# Patient Record
Sex: Female | Born: 1935 | Race: White | Hispanic: No | Smoking: Former smoker
Health system: Southern US, Community
[De-identification: ages and names within clinical notes are randomized; demographics above are authoritative.]

## PROBLEM LIST (undated history)

## (undated) DIAGNOSIS — I5030 Unspecified diastolic (congestive) heart failure: Secondary | ICD-10-CM

## (undated) DIAGNOSIS — R319 Hematuria, unspecified: Secondary | ICD-10-CM

## (undated) DIAGNOSIS — H9190 Unspecified hearing loss, unspecified ear: Secondary | ICD-10-CM

## (undated) DIAGNOSIS — I10 Essential (primary) hypertension: Secondary | ICD-10-CM

## (undated) DIAGNOSIS — M549 Dorsalgia, unspecified: Secondary | ICD-10-CM

## (undated) DIAGNOSIS — R269 Unspecified abnormalities of gait and mobility: Secondary | ICD-10-CM

## (undated) DIAGNOSIS — I272 Pulmonary hypertension, unspecified: Secondary | ICD-10-CM

## (undated) DIAGNOSIS — M353 Polymyalgia rheumatica: Secondary | ICD-10-CM

## (undated) DIAGNOSIS — I480 Paroxysmal atrial fibrillation: Secondary | ICD-10-CM

## (undated) DIAGNOSIS — I639 Cerebral infarction, unspecified: Secondary | ICD-10-CM

## (undated) DIAGNOSIS — I350 Nonrheumatic aortic (valve) stenosis: Secondary | ICD-10-CM

## (undated) DIAGNOSIS — C4490 Unspecified malignant neoplasm of skin, unspecified: Secondary | ICD-10-CM

## (undated) DIAGNOSIS — M810 Age-related osteoporosis without current pathological fracture: Secondary | ICD-10-CM

## (undated) DIAGNOSIS — J309 Allergic rhinitis, unspecified: Secondary | ICD-10-CM

## (undated) DIAGNOSIS — R609 Edema, unspecified: Secondary | ICD-10-CM

## (undated) DIAGNOSIS — F419 Anxiety disorder, unspecified: Secondary | ICD-10-CM

## (undated) DIAGNOSIS — N3090 Cystitis, unspecified without hematuria: Secondary | ICD-10-CM

## (undated) DIAGNOSIS — E782 Mixed hyperlipidemia: Secondary | ICD-10-CM

## (undated) DIAGNOSIS — M79643 Pain in unspecified hand: Secondary | ICD-10-CM

## (undated) HISTORY — DX: Anxiety disorder, unspecified: F41.9

## (undated) HISTORY — PX: BREAST BIOPSY: SHX20

## (undated) HISTORY — DX: Cerebral infarction, unspecified: I63.9

## (undated) HISTORY — DX: Unspecified malignant neoplasm of skin, unspecified: C44.90

## (undated) HISTORY — DX: Allergic rhinitis, unspecified: J30.9

## (undated) HISTORY — DX: Polymyalgia rheumatica: M35.3

## (undated) HISTORY — DX: Mixed hyperlipidemia: E78.2

## (undated) HISTORY — DX: Unspecified abnormalities of gait and mobility: R26.9

## (undated) HISTORY — DX: Nonrheumatic aortic (valve) stenosis: I35.0

## (undated) HISTORY — DX: Edema, unspecified: R60.9

## (undated) HISTORY — DX: Pain in unspecified hand: M79.643

## (undated) HISTORY — DX: Unspecified hearing loss, unspecified ear: H91.90

## (undated) HISTORY — DX: Cystitis, unspecified without hematuria: N30.90

## (undated) HISTORY — DX: Unspecified diastolic (congestive) heart failure: I50.30

## (undated) HISTORY — DX: Hematuria, unspecified: R31.9

## (undated) HISTORY — DX: Age-related osteoporosis without current pathological fracture: M81.0

## (undated) HISTORY — DX: Dorsalgia, unspecified: M54.9

---

## 2003-02-25 HISTORY — PX: INGUINAL HERNIA REPAIR: SUR1180

## 2004-08-16 ENCOUNTER — Ambulatory Visit: Payer: Self-pay | Admitting: Family Medicine

## 2005-09-03 ENCOUNTER — Ambulatory Visit: Payer: Self-pay | Admitting: Family Medicine

## 2005-09-09 ENCOUNTER — Ambulatory Visit: Payer: Self-pay | Admitting: Unknown Physician Specialty

## 2006-09-07 ENCOUNTER — Ambulatory Visit: Payer: Self-pay | Admitting: Family Medicine

## 2006-12-15 ENCOUNTER — Ambulatory Visit: Payer: Self-pay | Admitting: Family Medicine

## 2007-09-09 ENCOUNTER — Ambulatory Visit: Payer: Self-pay | Admitting: Family Medicine

## 2008-09-11 ENCOUNTER — Ambulatory Visit: Payer: Self-pay | Admitting: Family Medicine

## 2009-09-13 ENCOUNTER — Ambulatory Visit: Payer: Self-pay | Admitting: Family Medicine

## 2010-09-17 ENCOUNTER — Ambulatory Visit: Payer: Self-pay | Admitting: Family Medicine

## 2011-05-05 ENCOUNTER — Ambulatory Visit: Payer: Self-pay | Admitting: Family Medicine

## 2011-10-07 ENCOUNTER — Ambulatory Visit: Payer: Self-pay | Admitting: Family Medicine

## 2012-03-30 ENCOUNTER — Ambulatory Visit: Payer: Self-pay | Admitting: Family Medicine

## 2012-10-07 ENCOUNTER — Ambulatory Visit: Payer: Self-pay | Admitting: Family Medicine

## 2013-06-23 ENCOUNTER — Observation Stay: Payer: Self-pay | Admitting: Internal Medicine

## 2013-06-23 LAB — CBC
HCT: 43 % (ref 35.0–47.0)
HGB: 14.2 g/dL (ref 12.0–16.0)
MCH: 31.6 pg (ref 26.0–34.0)
MCHC: 32.9 g/dL (ref 32.0–36.0)
MCV: 96 fL (ref 80–100)
Platelet: 262 10*3/uL (ref 150–440)
RBC: 4.49 10*6/uL (ref 3.80–5.20)
RDW: 12.7 % (ref 11.5–14.5)
WBC: 10.1 10*3/uL (ref 3.6–11.0)

## 2013-06-23 LAB — COMPREHENSIVE METABOLIC PANEL
Albumin: 4.2 g/dL (ref 3.4–5.0)
Alkaline Phosphatase: 76 U/L
Anion Gap: 6 — ABNORMAL LOW (ref 7–16)
BUN: 25 mg/dL — AB (ref 7–18)
Bilirubin,Total: 0.7 mg/dL (ref 0.2–1.0)
CHLORIDE: 95 mmol/L — AB (ref 98–107)
CREATININE: 0.95 mg/dL (ref 0.60–1.30)
Calcium, Total: 10.1 mg/dL (ref 8.5–10.1)
Co2: 33 mmol/L — ABNORMAL HIGH (ref 21–32)
EGFR (Non-African Amer.): 54 — ABNORMAL LOW
GLUCOSE: 118 mg/dL — AB (ref 65–99)
OSMOLALITY: 274 (ref 275–301)
Potassium: 3.3 mmol/L — ABNORMAL LOW (ref 3.5–5.1)
SGOT(AST): 30 U/L (ref 15–37)
SGPT (ALT): 24 U/L (ref 12–78)
Sodium: 134 mmol/L — ABNORMAL LOW (ref 136–145)
TOTAL PROTEIN: 8.3 g/dL — AB (ref 6.4–8.2)

## 2013-06-23 LAB — URINALYSIS, COMPLETE
BLOOD: NEGATIVE
Bacteria: NONE SEEN
Bilirubin,UR: NEGATIVE
GLUCOSE, UR: NEGATIVE mg/dL (ref 0–75)
Hyaline Cast: 8
Nitrite: NEGATIVE
PH: 5 (ref 4.5–8.0)
Protein: 30
RBC,UR: 7 /HPF (ref 0–5)
Specific Gravity: 1.021 (ref 1.003–1.030)
WBC UR: 8 /HPF (ref 0–5)

## 2013-06-23 LAB — HEMOGLOBIN: HGB: 12.1 g/dL (ref 12.0–16.0)

## 2013-06-23 LAB — TROPONIN I: Troponin-I: <0.02 ng/mL

## 2013-06-24 LAB — CBC WITH DIFFERENTIAL/PLATELET
Basophil #: 0.1 10*3/uL (ref 0.0–0.1)
Basophil %: 0.9 %
Eosinophil #: 0.1 10*3/uL (ref 0.0–0.7)
Eosinophil %: 1.3 %
HCT: 32.8 % — ABNORMAL LOW (ref 35.0–47.0)
HGB: 11.2 g/dL — ABNORMAL LOW (ref 12.0–16.0)
Lymphocyte #: 1.5 10*3/uL (ref 1.0–3.6)
Lymphocyte %: 21 %
MCH: 32.5 pg (ref 26.0–34.0)
MCHC: 34 g/dL (ref 32.0–36.0)
MCV: 96 fL (ref 80–100)
Monocyte #: 0.7 x10 3/mm (ref 0.2–0.9)
Monocyte %: 9.4 %
NEUTROS ABS: 4.7 10*3/uL (ref 1.4–6.5)
Neutrophil %: 67.4 %
PLATELETS: 215 10*3/uL (ref 150–440)
RBC: 3.44 10*6/uL — AB (ref 3.80–5.20)
RDW: 12.7 % (ref 11.5–14.5)
WBC: 6.9 10*3/uL (ref 3.6–11.0)

## 2013-06-24 LAB — BASIC METABOLIC PANEL
Anion Gap: 3 — ABNORMAL LOW (ref 7–16)
BUN: 17 mg/dL (ref 7–18)
Calcium, Total: 8.5 mg/dL (ref 8.5–10.1)
Chloride: 103 mmol/L (ref 98–107)
Co2: 31 mmol/L (ref 21–32)
Creatinine: 0.59 mg/dL — ABNORMAL LOW (ref 0.60–1.30)
EGFR (African American): >60
EGFR (Non-African Amer.): >60
Glucose: 97 mg/dL (ref 65–99)
OSMOLALITY: 275 (ref 275–301)
Potassium: 3.5 mmol/L (ref 3.5–5.1)
Sodium: 137 mmol/L (ref 136–145)

## 2013-06-24 LAB — HEMOGLOBIN
HGB: 11.2 g/dL — ABNORMAL LOW (ref 12.0–16.0)
HGB: 11.7 g/dL — ABNORMAL LOW (ref 12.0–16.0)

## 2013-06-24 LAB — TSH: Thyroid Stimulating Horm: 0.95 u[IU]/mL

## 2013-06-24 LAB — MAGNESIUM: MAGNESIUM: 1.5 mg/dL — AB

## 2013-06-25 LAB — URINE CULTURE

## 2013-08-19 ENCOUNTER — Ambulatory Visit: Payer: Self-pay | Admitting: Neurology

## 2013-11-16 ENCOUNTER — Ambulatory Visit: Payer: Self-pay | Admitting: Family Medicine

## 2014-06-17 NOTE — Discharge Summary (Signed)
PATIENT NAME:  Krystal White, Krystal White MR#:  [id number] DATE OF BIRTH:  10/15/1924  DATE OF ADMISSION:  06/23/2013 DATE OF DISCHARGE:   06/24/2013  ADMISSION DIAGNOSIS: 1.   Orthostatic hypotension.   DISCHARGE DIAGNOSES: 1.  Orthostatic hypotension.  2.  Chronic diarrhea.  3.  Dizziness.  4.  Hypertension.   CONSULTATIONS:  None.   PERTINENT LABORATORIES AT DISCHARGE:  White blood cells 6.9, hemoglobin 11.2, hematocrit 33. Platelets are 215. Sodium 137, potassium 3.5, chloride 103, bicarb 31, BUN 17, creatinine 0.59. Glucose is 97.   Magnesium 1.5, which was repleted.  TSH is 0.95.   Carotid ultrasound showed no hemodynamically significant stenosis.   HOSPITAL COURSE:  An 81 year old female who presented with dizziness and orthostatic hypotension. For further details, please refer to H and P.  1.  Orthostatic hypotension.  This is secondary to diuretics and/or her diarrhea with volume depletion. The patient is not orthostatic anymore with discontinuing her diuretics, as well as IV fluids. Physical therapy does recommend assisted living, but the patient will go home with 24-hour supervision.  2.  Dizziness from problem #1, which is resolved.  3.  Chronic diarrhea. The patient had no diarrhea while she was in the hospital. She has never had a colonoscopy. At her age, it may or may not be recommended, but I did ask the family to have her evaluated by GI.  4.  Hypertension. The patient will continue her metoprolol but will hold diuretics at this time.   DISCHARGE MEDICATIONS: 1.  Calcium 600 mg b.i.d.  2.  Centrum Silver 1 tablet daily.  3.  Aspirin 81 mg daily.  4.  Simvastatin 20 mg at bedtime. 5.  Metoprolol 25 mg daily.  6.  The patient will stop taking hydrochlorothiazide/triamterene.   Discharge home health with physical therapy, nurse, nurse aide and social work. Discharge home health assessment and gait training.   DISCHARGE DIET:  Low sodium.   DISCHARGE ACTIVITY:  As  tolerated.   DISCHARGE FOLLOWUP:  The patient is to follow up with Dr. Margarita Rana in 1 week.    TIME SPENT:  35 minutes.   ____________________________ Donell Beers. Benjie Karvonen, MD spm:dmm D: 06/24/2013 12:27:43 ET T: 06/24/2013 12:39:29 ET JOB#: [id number]  cc: Darrall Strey P. Benjie Karvonen, MD, <Dictator> Jerrell Belfast, MD Donell Beers Tyrik Stetzer MD ELECTRONICALLY SIGNED 06/24/2013 13:12

## 2014-06-17 NOTE — H&P (Signed)
PATIENT NAME:  Krystal White, Krystal White MR#:  [id number] DATE OF BIRTH:  14-Jun-1924  DATE OF ADMISSION:  06/23/2013  PRIMARY CARE PHYSICIAN: Dr. Margarita Rana.   REFERRING PHYSICIAN: Dr. Corky Downs.   CHIEF COMPLAINT: Dizziness.   HISTORY OF PRESENT ILLNESS: The patient is a pleasant 81 year old female who lives by herself. She has hypertension, hyperlipidemia. She is here for dizziness. She states that for the past couple of months she has been dizzy, unsteady on her feet where she just feels like her knees buckle due to weakness and she falls without loss of consciousness. She does not know when she will follow but does describe positional dizziness. She states in the morning when she is laying she is fine. With ambulation and mobility, she feels dizzy. Of note, also she describes more chronic in nature of diarrhea. She states she has to take multiple doses of Imodium and if she does not take Imodium has about 3 loose stools a day. Today, she felt dizzy again and also noted to have dark stools. She had no bright red blood per rectum. She came into the hospital where she was found to have significant orthostatic hypotension and had rectal exam without any bright red blood and had negative guaiac per ER physician. Hospitalist services were contacted for further evaluation and management.   PAST MEDICAL HISTORY: 1.  Hard of hearing with left-sided hearing loss. 2.  Hypercholesterolemia.  3.  Hypertension.   OUTPATIENT MEDICATIONS: Aspirin 81 mg daily, calcium plus D 600/600/200, one tab 2 times a day, Centrum multivitamin with minerals once a day, hydrochlorothiazide/triamterene 50/75 mg once a day, metoprolol succinate 25 mg extended-release daily, simvastatin 20 mg daily.   ALLERGIES: No known drug allergies.   FAMILY HISTORY: Dad with some heart issues and stomach cancer.   SOCIAL HISTORY: No tobacco, alcohol or drug use. Lives by herself.   REVIEW OF SYSTEMS: CONSTITUTIONAL: No fever or fatigue.  Has weakness and question of weight loss, but she does not know how much. She has good appetite.  EYES: No blurry vision or double vision.  ENT: Denies tinnitus or hearing loss. Has seasonal allergies, mostly at winter. RESPIRATORY: Has dry cough in winter, now resolved. No wheezing. Occasional shortness of breath, no dyspnea on exertion.  GASTROINTESTINAL: Denies nausea, vomiting, diarrhea as above. No abdominal pain. No bright red blood per rectum.  CARDIOVASCULAR: Denies chest pain or swelling in the legs. Denies palpitations.  GENITOURINARY: Denies dysuria, hematuria. HEMATOLOGIC AND LYMPHATIC: Denies anemia or easy bruising.  SKIN: No rashes.  MUSCULOSKELETAL: Has some chronic back pain requiring injections.  NEUROLOGIC: No focal weakness or numbness.  PSYCHIATRIC: Denies anxiety or insomnia.   PHYSICAL EXAMINATION: VITAL SIGNS: On arrival, temperature 98, pulse rate 88, respiratory rate 19, blood pressure 151/71, O2 sat 95% on room air. Orthostatic vitals: When lying, blood pressure 144/56 with pulse rate of 84 and standing pressures drop systolic 40 points to 510, diastolic 71 and pulse rate jumps to 110.  GENERAL: The patient is a well-developed, elderly female lying in bed, talking in full sentences but difficult of hearing. HEENT: Normocephalic, atraumatic. Pupils are equal and reactive. Anicteric sclerae. Dry mucous membranes.  NECK: Supple. No thyroid tenderness. No cervical lymphadenopathy.  CARDIOVASCULAR: S1, S2 regular. No significant murmurs appreciated.  LUNGS: Clear to auscultation. No wheezing, rhonchi or rales.  ABDOMEN: Soft, nontender, nondistended. Positive bowel sounds in all quadrants.  EXTREMITIES: No pitting edema.  NEUROLOGIC: Cranial nerves II through XII appear to be grossly intact. Strength is  5 out of 5 in all extremities. Sensation is intact to light touch. Good alternating hand movements bilaterally and good finger-to-nose touch.  SKIN: No obvious rashes or  lesions.  PSYCHIATRIC: Awake, alert, oriented x 3.  LABORATORIES AND IMAGING: Glucose 118, BUN 25, creatinine 0.95. Sodium 134, potassium 3.3. LFTs showed total protein of 8.3; otherwise, within normal limits. Troponin negative. White count of 10.1, hemoglobin 14.2. Platelets are 262.  UA with no nitrites, 2+ leukocyte esterase, 8 WBC, no bacteria.   EKG: Normal sinus rhythm, rate of 88. No acute ST changes.  X-ray of the chest, one view:  Clear lungs without airspace disease or edema. No pneumothorax. Heart size is normal.   ASSESSMENT AND PLAN: We have a pleasant 81 year old with hypertension and hyperlipidemia lives by herself, who comes in with chronic diarrhea with positional dizziness, an episode of weakness to the point of falling, was noted to have severe orthostatic hypotension. She also has negative guaiac here. I suspect her dizziness is likely secondary to severe orthostatic hypotension. The etiology  of this could be the chronic diarrhea she has been having causing volume loss and intravascular dehydration. Alternatively, the patient could also have a diuretic-induced orthostatic hypotension with hypokalemia. She is on rather high-dose diuretics at this time, which I would hold. Her systolic blood pressure drops 40 points with standing. With fluid, resuscitate her gently. We would also check twice daily orthostatic vitals, place her on a telemetry to evaluate for any significant arrhythmias, but she has an unremarkable EKG and negative troponin without any chest pains. In regards to the dark stools today, she denies having any bright red blood per rectum. Hemoglobin stable. The etiology of chronic diarrhea is unclear. Start with stool cultures including Clostridium difficile which I think is less likely. The chronic diarrhea, if we do not have an answer here, could be further investigated as an outpatient by gastroenterology referral and consult. I would order ultrasound of the carotids and  echocardiogram for completeness of workup, but at this point, she appears to be neurologically intact. I do not suspect a posterior cerebrovascular accident type of a picture. Would obtain physical therapy consult, ambulate the patient once hydration is done. Would check another guaiac, cycle the hemoglobin x 3. I would continue the beta blocker at this time. I would hold aspirin, start the patient on sequential compression devices and TEDs for deep vein thrombosis prophylaxis. I would continue the statin. In regards to the dark stool, I do not know if this is necessarily a gastrointestinal bleed but would check another guaiac stool, cycle the hemoglobin and start the patient on a proton pump inhibitor.  The patient is FULL CODE.  TOTAL TIME SPENT:  45 minutes.  ____________________________ Vivien Presto, MD sa:ce D: 06/23/2013 15:49:20 ET T: 06/23/2013 16:42:07 ET JOB#: [id number]  cc: Vivien Presto, MD, <Dictator> Jerrell Belfast, MD Karel Jarvis [facility] MD ELECTRONICALLY SIGNED 07/19/2013 15:52

## 2015-09-08 ENCOUNTER — Encounter: Payer: Self-pay | Admitting: Emergency Medicine

## 2015-09-08 ENCOUNTER — Emergency Department: Payer: Medicare Other

## 2015-09-08 ENCOUNTER — Emergency Department
Admission: EM | Admit: 2015-09-08 | Discharge: 2015-09-08 | Disposition: A | Discharge status: Home / Self Care | Payer: Medicare Other | Attending: Emergency Medicine | Admitting: Emergency Medicine

## 2015-09-08 DIAGNOSIS — Y929 Unspecified place or not applicable: Secondary | ICD-10-CM | POA: Insufficient documentation

## 2015-09-08 DIAGNOSIS — S62101A Fracture of unspecified carpal bone, right wrist, initial encounter for closed fracture: Secondary | ICD-10-CM

## 2015-09-08 DIAGNOSIS — W010XXA Fall on same level from slipping, tripping and stumbling without subsequent striking against object, initial encounter: Secondary | ICD-10-CM | POA: Diagnosis not present

## 2015-09-08 DIAGNOSIS — Y939 Activity, unspecified: Secondary | ICD-10-CM | POA: Diagnosis not present

## 2015-09-08 DIAGNOSIS — Y999 Unspecified external cause status: Secondary | ICD-10-CM | POA: Insufficient documentation

## 2015-09-08 DIAGNOSIS — I10 Essential (primary) hypertension: Secondary | ICD-10-CM | POA: Diagnosis not present

## 2015-09-08 DIAGNOSIS — S52501A Unspecified fracture of the lower end of right radius, initial encounter for closed fracture: Secondary | ICD-10-CM | POA: Insufficient documentation

## 2015-09-08 DIAGNOSIS — M25531 Pain in right wrist: Secondary | ICD-10-CM | POA: Diagnosis present

## 2015-09-08 HISTORY — DX: Essential (primary) hypertension: I10

## 2015-09-08 MED ORDER — TRAMADOL HCL 50 MG PO TABS: 50.0000 mg | ORAL_TABLET | Freq: Two times a day (BID) | ORAL | Status: DC | PRN

## 2015-09-08 MED ORDER — IBUPROFEN 400 MG PO TABS
400.0000 mg | ORAL_TABLET | Freq: Once | ORAL | Status: AC
  Administered 2015-09-08: 400 mg via ORAL
  Filled 2015-09-08: qty 1

## 2015-09-08 MED ORDER — TRAMADOL HCL 50 MG PO TABS
50.0000 mg | ORAL_TABLET | Freq: Once | ORAL | Status: AC
  Administered 2015-09-08: 50 mg via ORAL
  Filled 2015-09-08: qty 1

## 2015-09-08 NOTE — ED Notes (Signed)
Fell last pm  Having pain to right wrist  Unsure of what made her fall

## 2015-09-08 NOTE — ED Provider Notes (Signed)
[facility] Emergency Department Provider Note   ____________________________________________  Time seen: Approximately 1:33 PM  I have reviewed the triage vital signs and the nursing notes.   HISTORY  Chief Complaint Fall    HPI Krystal White is a 82 y.o. female patient complaining of right wrist pain and swelling secondary to a fall which happened last night. She denies any LOC. Patient denies any head injury. States rates the pain as a 10 over 10. No palliative measures taken for this complaint. Patient described a pain as "sharp". Patient states pain increase with flexion and extension of the wrist. Patient is right-hand dominant.   Past Medical History  Diagnosis Date  . Hypertension     There are no active problems to display for this patient.   History reviewed. No pertinent past surgical history.  Current Outpatient Rx  Name  Route  Sig  Dispense  Refill  . traMADol (ULTRAM) 50 MG tablet   Oral   Take 1 tablet (50 mg total) by mouth every 12 (twelve) hours as needed for moderate pain.   12 tablet   0     Allergies Review of patient's allergies indicates no known allergies.  No family history on file.  Social History Social History  Substance Use Topics  . Smoking status: Never Smoker   . Smokeless tobacco: None  . Alcohol Use: No    Review of Systems Constitutional: No fever/chills Eyes: No visual changes. ENT: No sore throat. Cardiovascular: Denies chest pain. Respiratory: Denies shortness of breath. Gastrointestinal: No abdominal pain.  No nausea, no vomiting.  No diarrhea.  No constipation. Genitourinary: Negative for dysuria. Musculoskeletal: Right wrist pain Skin: Negative for rash. Neurological: Negative for headaches, focal weakness or numbness.    ____________________________________________   PHYSICAL EXAM:  VITAL SIGNS: ED Triage Vitals  Enc Vitals Group     BP 09/08/15 1320 153/93 mmHg   Pulse Rate 09/08/15 1320 72     Resp 09/08/15 1320 20     Temp 09/08/15 1320 98.1 F (36.7 C)     Temp Source 09/08/15 1320 Oral     SpO2 09/08/15 1320 97 %     Weight 09/08/15 1320 120 lb (54.432 kg)     Height 09/08/15 1320 5' (1.524 m)     Head Cir --      Peak Flow --      Pain Score 09/08/15 1322 10     Pain Loc --      Pain Edu? --      Excl. in [place]? --     Constitutional: Alert and oriented. Well appearing and in no acute distress. Eyes: Conjunctivae are normal. PERRL. EOMI. Head: Atraumatic. Nose: No congestion/rhinnorhea. Mouth/Throat: Mucous membranes are moist.  Oropharynx non-erythematous. Neck: No stridor.  No cervical spine tenderness to palpation. Hematological/Lymphatic/Immunilogical: No cervical lymphadenopathy. Cardiovascular: Normal rate, regular rhythm. Grossly normal heart sounds.  Good peripheral circulation. Respiratory: Normal respiratory effort.  No retractions. Lungs CTAB. Gastrointestinal: Soft and nontender. No distention. No abdominal bruits. No CVA tenderness. Musculoskeletal: No obvious deformity to the right wrist. Patient has some moderate edema distal radius of the right wrist. Neurovascular intact.  Neurologic:  Normal speech and language. No gross focal neurologic deficits are appreciated. No gait instability. Skin:  Skin is warm, dry and intact. No rash noted. Psychiatric: Mood and affect are normal. Speech and behavior are normal.  ____________________________________________   LABS (all labs ordered are listed, but only abnormal results are displayed)  Labs Reviewed - No data to display ____________________________________________  EKG   ____________________________________________  RADIOLOGY  X-ray reveals distal radial fracture. I, Sable Feil, personally viewed and evaluated these images (plain radiographs) as part of my medical decision making, as well as reviewing the written report by the  radiologist.  ____________________________________________   PROCEDURES  Procedure(s) performed: None  Procedures  Critical Care performed: No  ____________________________________________   INITIAL IMPRESSION / ASSESSMENT AND PLAN / ED COURSE  Pertinent labs & imaging results that were available during my care of the patient were reviewed by me and considered in my medical decision making (see chart for details).  Distal radial fracture left wrist. Discussed x-ray finding with patient. Patient placed in a volar splint. Patient given a prescription for tramadol to take 1 every 12 hours as needed for pain. Patient to follow-up with orthopedics Department in 2 days for further evaluation and treatment.` ____________________________________________   FINAL CLINICAL IMPRESSION(S) / ED DIAGNOSES  Final diagnoses:  Right wrist fracture, closed, initial encounter      NEW MEDICATIONS STARTED DURING THIS VISIT:  New Prescriptions   TRAMADOL (ULTRAM) 50 MG TABLET    Take 1 tablet (50 mg total) by mouth every 12 (twelve) hours as needed for moderate pain.     Note:  This document was prepared using Dragon voice recognition software and may include unintentional dictation errors.    Sable Feil, PA-C 09/08/15 1403  Harvest Dark, MD 09/08/15 1451

## 2015-09-08 NOTE — Discharge Instructions (Signed)
Wrist Fracture °A wrist fracture is a break or crack in one of the bones of your wrist. Your wrist is made up of eight small bones at the palm of your hand (carpal bones) and two long bones that make up your forearm (radius and ulna). °CAUSES °· A direct blow to the wrist. °· Falling on an outstretched hand. °· Trauma, such as a car accident or a fall. °RISK FACTORS °Risk factors for wrist fracture include: °· Participating in contact and high-risk sports, such as skiing, biking, and ice skating. °· Taking steroid medicines. °· Smoking. °· Being female. °· Being Caucasian. °· Drinking more than three alcoholic beverages per day. °· Having low or lowered bone density (osteoporosis or osteopenia). °· Age. Older adults have decreased bone density. °· Women who have had menopause. °· History of previous fractures. °SIGNS AND SYMPTOMS °Symptoms of wrist fractures include tenderness, bruising, and inflammation. Additionally, the wrist may hang in an odd position or appear deformed. °DIAGNOSIS °Diagnosis may include: °· Physical exam. °· X-ray. °TREATMENT °Treatment depends on many factors, including the nature and location of the fracture, your age, and your activity level. Treatment for wrist fracture can be nonsurgical or surgical. °Nonsurgical Treatment °A plaster cast or splint may be applied to your wrist if the bone is in a good position. If the fracture is not in good position, it may be necessary for your health care provider to realign it before applying a splint or cast. Usually, a cast or splint will be worn for several weeks. °Surgical Treatment °Sometimes the position of the bone is so far out of place that surgery is required to apply a device to hold it together as it heals. Depending on the fracture, there are a number of options for holding the bone in place while it heals, such as a cast and metal pins. °HOME CARE INSTRUCTIONS °· Keep your injured wrist elevated and move your fingers as much as  possible. °· Do not put pressure on any part of your cast or splint. It may break. °· Use a plastic bag to protect your cast or splint from water while bathing or showering. Do not lower your cast or splint into water. °· Take medicines only as directed by your health care provider. °· Keep your cast or splint clean and dry. If it becomes wet, damaged, or suddenly feels too tight, contact your health care provider right away. °· Do not use any tobacco products including cigarettes, chewing tobacco, or electronic cigarettes. Tobacco can delay bone healing. If you need help quitting, ask your health care provider. °· Keep all follow-up visits as directed by your health care provider. This is important. °· Ask your health care provider if you should take supplements of calcium and vitamins C and D to promote bone healing. °SEEK MEDICAL CARE IF: °· Your cast or splint is damaged, breaks, or gets wet. °· You have a fever. °· You have chills. °· You have continued severe pain or more swelling than you did before the cast was put on. °SEEK IMMEDIATE MEDICAL CARE IF: °· Your hand or fingernails on the injured arm turn blue or gray, or feel cold or numb. °· You have decreased feeling in the fingers of your injured arm. °MAKE SURE YOU: °· Understand these instructions. °· Will watch your condition. °· Will get help right away if you are not doing well or get worse. °  °This information is not intended to replace advice given to you by your   health care provider. Make sure you discuss any questions you have with your health care provider.   Document Released: 11/20/2004 Document Revised: 11/01/2014 Document Reviewed: 02/28/2011 Elsevier Interactive Patient Education 2016 [employer] or Splint Care Casts and splints support injured limbs and keep bones from moving while they heal. It is important to care for your cast or splint at home.  HOME CARE INSTRUCTIONS  Keep the cast or splint uncovered during the  drying period. It can take 24 to 48 hours to dry if it is made of plaster. A fiberglass cast will dry in less than 1 hour.  Do not rest the cast on anything harder than a pillow for the first 24 hours.  Do not put weight on your injured limb or apply pressure to the cast until your health care provider gives you permission.  Keep the cast or splint dry. Wet casts or splints can lose their shape and may not support the limb as well. A wet cast that has lost its shape can also create harmful pressure on your skin when it dries. Also, wet skin can become infected.  Cover the cast or splint with a plastic bag when bathing or when out in the rain or snow. If the cast is on the trunk of the body, take sponge baths until the cast is removed.  If your cast does become wet, dry it with a towel or a blow dryer on the cool setting only.  Keep your cast or splint clean. Soiled casts may be wiped with a moistened cloth.  Do not place any hard or soft foreign objects under your cast or splint, such as cotton, toilet paper, lotion, or powder.  Do not try to scratch the skin under the cast with any object. The object could get stuck inside the cast. Also, scratching could lead to an infection. If itching is a problem, use a blow dryer on a cool setting to relieve discomfort.  Do not trim or cut your cast or remove padding from inside of it.  Exercise all joints next to the injury that are not immobilized by the cast or splint. For example, if you have a long leg cast, exercise the hip joint and toes. If you have an arm cast or splint, exercise the shoulder, elbow, thumb, and fingers.  Elevate your injured arm or leg on 1 or 2 pillows for the first 1 to 3 days to decrease swelling and pain.It is best if you can comfortably elevate your cast so it is higher than your heart. SEEK MEDICAL CARE IF:   Your cast or splint cracks.  Your cast or splint is too tight or too loose.  You have unbearable itching  inside the cast.  Your cast becomes wet or develops a soft spot or area.  You have a bad smell coming from inside your cast.  You get an object stuck under your cast.  Your skin around the cast becomes red or raw.  You have new pain or worsening pain after the cast has been applied. SEEK IMMEDIATE MEDICAL CARE IF:   You have fluid leaking through the cast.  You are unable to move your fingers or toes.  You have discolored (blue or white), cool, painful, or very swollen fingers or toes beyond the cast.  You have tingling or numbness around the injured area.  You have severe pain or pressure under the cast.  You have any difficulty with your breathing or have shortness of  breath.  You have chest pain.   This information is not intended to replace advice given to you by your health care provider. Make sure you discuss any questions you have with your health care provider.   Document Released: 02/08/2000 Document Revised: 12/01/2012 Document Reviewed: 08/19/2012 Elsevier Interactive Patient Education [employer].

## 2015-09-08 NOTE — ED Notes (Signed)
Rt wrist pain and swelling after falling - tripped, was not dizzy.

## 2017-02-02 ENCOUNTER — Inpatient Hospital Stay (HOSPITAL_COMMUNITY)
Admit: 2017-02-02 | Discharge: 2017-02-02 | Disposition: A | Discharge status: Home / Self Care | Payer: Medicare Other | Attending: Internal Medicine | Admitting: Internal Medicine

## 2017-02-02 ENCOUNTER — Other Ambulatory Visit: Payer: Self-pay

## 2017-02-02 ENCOUNTER — Encounter: Payer: Self-pay | Admitting: Emergency Medicine

## 2017-02-02 ENCOUNTER — Inpatient Hospital Stay
Admission: EM | Admit: 2017-02-02 | Discharge: 2017-02-07 | DRG: 291 | Disposition: A | Discharge status: Home Health | Payer: Medicare Other | Attending: Specialist | Admitting: Specialist

## 2017-02-02 ENCOUNTER — Emergency Department: Payer: Medicare Other

## 2017-02-02 DIAGNOSIS — I272 Pulmonary hypertension, unspecified: Secondary | ICD-10-CM | POA: Diagnosis present

## 2017-02-02 DIAGNOSIS — J810 Acute pulmonary edema: Secondary | ICD-10-CM | POA: Diagnosis present

## 2017-02-02 DIAGNOSIS — E876 Hypokalemia: Secondary | ICD-10-CM | POA: Diagnosis present

## 2017-02-02 DIAGNOSIS — Z974 Presence of external hearing-aid: Secondary | ICD-10-CM

## 2017-02-02 DIAGNOSIS — J9601 Acute respiratory failure with hypoxia: Secondary | ICD-10-CM | POA: Diagnosis present

## 2017-02-02 DIAGNOSIS — I5031 Acute diastolic (congestive) heart failure: Secondary | ICD-10-CM | POA: Diagnosis present

## 2017-02-02 DIAGNOSIS — I340 Nonrheumatic mitral (valve) insufficiency: Secondary | ICD-10-CM | POA: Diagnosis not present

## 2017-02-02 DIAGNOSIS — I071 Rheumatic tricuspid insufficiency: Secondary | ICD-10-CM | POA: Diagnosis present

## 2017-02-02 DIAGNOSIS — I509 Heart failure, unspecified: Secondary | ICD-10-CM

## 2017-02-02 DIAGNOSIS — I4891 Unspecified atrial fibrillation: Secondary | ICD-10-CM

## 2017-02-02 DIAGNOSIS — H9190 Unspecified hearing loss, unspecified ear: Secondary | ICD-10-CM | POA: Diagnosis present

## 2017-02-02 DIAGNOSIS — I110 Hypertensive heart disease with heart failure: Secondary | ICD-10-CM | POA: Diagnosis not present

## 2017-02-02 DIAGNOSIS — R791 Abnormal coagulation profile: Secondary | ICD-10-CM | POA: Diagnosis present

## 2017-02-02 DIAGNOSIS — I481 Persistent atrial fibrillation: Secondary | ICD-10-CM | POA: Diagnosis present

## 2017-02-02 DIAGNOSIS — E785 Hyperlipidemia, unspecified: Secondary | ICD-10-CM | POA: Diagnosis present

## 2017-02-02 DIAGNOSIS — I351 Nonrheumatic aortic (valve) insufficiency: Secondary | ICD-10-CM

## 2017-02-02 DIAGNOSIS — Z8249 Family history of ischemic heart disease and other diseases of the circulatory system: Secondary | ICD-10-CM | POA: Diagnosis not present

## 2017-02-02 LAB — COMPREHENSIVE METABOLIC PANEL
ALBUMIN: 2.9 g/dL — AB (ref 3.5–5.0)
ALT: 54 U/L (ref 14–54)
AST: 49 U/L — AB (ref 15–41)
Alkaline Phosphatase: 68 U/L (ref 38–126)
Anion gap: 10 (ref 5–15)
BILIRUBIN TOTAL: 1.1 mg/dL (ref 0.3–1.2)
BUN: 16 mg/dL (ref 6–20)
CALCIUM: 8.8 mg/dL — AB (ref 8.9–10.3)
CO2: 27 mmol/L (ref 22–32)
Chloride: 97 mmol/L — ABNORMAL LOW (ref 101–111)
Creatinine, Ser: 0.7 mg/dL (ref 0.44–1.00)
GFR calc Af Amer: >60 mL/min (ref >60)
GFR calc non Af Amer: >60 mL/min (ref >60)
GLUCOSE: 114 mg/dL — AB (ref 65–99)
Potassium: 3.6 mmol/L (ref 3.5–5.1)
Sodium: 134 mmol/L — ABNORMAL LOW (ref 135–145)
TOTAL PROTEIN: 6.7 g/dL (ref 6.5–8.1)

## 2017-02-02 LAB — URINALYSIS, COMPLETE (UACMP) WITH MICROSCOPIC
BACTERIA UA: NONE SEEN
BILIRUBIN URINE: NEGATIVE
Glucose, UA: NEGATIVE mg/dL
Hgb urine dipstick: NEGATIVE
KETONES UR: NEGATIVE mg/dL
LEUKOCYTES UA: NEGATIVE
Nitrite: NEGATIVE
PH: 7 (ref 5.0–8.0)
Protein, ur: NEGATIVE mg/dL
SPECIFIC GRAVITY, URINE: 1.009 (ref 1.005–1.030)

## 2017-02-02 LAB — MAGNESIUM: Magnesium: 1.7 mg/dL (ref 1.7–2.4)

## 2017-02-02 LAB — PROTIME-INR
INR: 1.2
PROTHROMBIN TIME: 15.1 s (ref 11.4–15.2)

## 2017-02-02 LAB — CBC WITH DIFFERENTIAL/PLATELET
Basophils Absolute: 0.1 10*3/uL (ref 0–0.1)
Basophils Relative: 1 %
EOS ABS: 0.2 10*3/uL (ref 0–0.7)
EOS PCT: 3 %
HCT: 37.3 % (ref 35.0–47.0)
Hemoglobin: 12.7 g/dL (ref 12.0–16.0)
LYMPHS ABS: 0.8 10*3/uL — AB (ref 1.0–3.6)
LYMPHS PCT: 9 %
MCH: 32 pg (ref 26.0–34.0)
MCHC: 34.1 g/dL (ref 32.0–36.0)
MCV: 93.8 fL (ref 80.0–100.0)
Monocytes Absolute: 0.7 10*3/uL (ref 0.2–0.9)
Monocytes Relative: 8 %
Neutro Abs: 7 10*3/uL — ABNORMAL HIGH (ref 1.4–6.5)
Neutrophils Relative %: 79 %
PLATELETS: 299 10*3/uL (ref 150–440)
RBC: 3.98 MIL/uL (ref 3.80–5.20)
RDW: 13.8 % (ref 11.5–14.5)
WBC: 8.8 10*3/uL (ref 3.6–11.0)

## 2017-02-02 LAB — APTT: aPTT: 88 seconds — ABNORMAL HIGH (ref 24–36)

## 2017-02-02 LAB — LIPASE, BLOOD: Lipase: 25 U/L (ref 11–51)

## 2017-02-02 LAB — ECHOCARDIOGRAM COMPLETE
HEIGHTINCHES: 61 in
Weight: 1872 oz

## 2017-02-02 LAB — PROCALCITONIN

## 2017-02-02 LAB — TROPONIN I
Troponin I: 0.03 ng/mL (ref <0.03)
Troponin I: 0.03 ng/mL (ref <0.03)
Troponin I: <0.03 ng/mL (ref <0.03)
Troponin I: 0.03 ng/mL (ref <0.03)

## 2017-02-02 LAB — LACTIC ACID, PLASMA: Lactic Acid, Venous: 1.8 mmol/L (ref 0.5–1.9)

## 2017-02-02 MED ORDER — METOPROLOL TARTRATE 5 MG/5ML IV SOLN
5.0000 mg | Freq: Four times a day (QID) | INTRAVENOUS | Status: DC | PRN
  Administered 2017-02-03 – 2017-02-04 (×3): 5 mg via INTRAVENOUS
  Filled 2017-02-02 (×3): qty 5

## 2017-02-02 MED ORDER — HEPARIN (PORCINE) IN NACL 100-0.45 UNIT/ML-% IJ SOLN
1000.0000 [IU]/h | INTRAMUSCULAR | Status: DC
  Administered 2017-02-02: 800 [IU]/h via INTRAVENOUS
  Administered 2017-02-03: 900 [IU]/h via INTRAVENOUS
  Administered 2017-02-04 – 2017-02-06 (×2): 1000 [IU]/h via INTRAVENOUS
  Filled 2017-02-02 (×4): qty 250

## 2017-02-02 MED ORDER — DOCUSATE SODIUM 100 MG PO CAPS: 100.0000 mg | ORAL_CAPSULE | Freq: Two times a day (BID) | ORAL | Status: DC | PRN

## 2017-02-02 MED ORDER — SIMVASTATIN 20 MG PO TABS
20.0000 mg | ORAL_TABLET | Freq: Every day | ORAL | Status: DC
  Administered 2017-02-02 – 2017-02-06 (×5): 20 mg via ORAL
  Filled 2017-02-02 (×5): qty 1

## 2017-02-02 MED ORDER — PIPERACILLIN-TAZOBACTAM 3.375 G IVPB
3.3750 g | Freq: Three times a day (TID) | INTRAVENOUS | Status: DC
  Administered 2017-02-02 – 2017-02-03 (×3): 3.375 g via INTRAVENOUS
  Filled 2017-02-02 (×3): qty 50

## 2017-02-02 MED ORDER — PIPERACILLIN-TAZOBACTAM 3.375 G IVPB 30 MIN
3.3750 g | Freq: Once | INTRAVENOUS | Status: AC
  Administered 2017-02-02: 3.375 g via INTRAVENOUS
  Filled 2017-02-02: qty 50

## 2017-02-02 MED ORDER — HEPARIN BOLUS VIA INFUSION
2700.0000 [IU] | Freq: Once | INTRAVENOUS | Status: AC
  Administered 2017-02-02: 2700 [IU] via INTRAVENOUS
  Filled 2017-02-02: qty 2700

## 2017-02-02 MED ORDER — FUROSEMIDE 10 MG/ML IJ SOLN
40.0000 mg | Freq: Once | INTRAMUSCULAR | Status: AC
  Administered 2017-02-02: 40 mg via INTRAVENOUS
  Filled 2017-02-02: qty 4

## 2017-02-02 MED ORDER — HEPARIN SODIUM (PORCINE) 5000 UNIT/ML IJ SOLN: 5000.0000 [IU] | Freq: Three times a day (TID) | INTRAMUSCULAR | Status: DC

## 2017-02-02 MED ORDER — METOPROLOL SUCCINATE ER 50 MG PO TB24
50.0000 mg | ORAL_TABLET | Freq: Every day | ORAL | Status: DC
  Administered 2017-02-03 – 2017-02-06 (×4): 50 mg via ORAL
  Filled 2017-02-02 (×4): qty 1

## 2017-02-02 MED ORDER — DILTIAZEM HCL 25 MG/5ML IV SOLN
10.0000 mg | Freq: Once | INTRAVENOUS | Status: AC
  Administered 2017-02-02: 10 mg via INTRAVENOUS
  Filled 2017-02-02: qty 5

## 2017-02-02 MED ORDER — FLUTICASONE PROPIONATE 50 MCG/ACT NA SUSP
2.0000 | Freq: Every day | NASAL | Status: DC | PRN
  Filled 2017-02-02: qty 16

## 2017-02-02 MED ORDER — VANCOMYCIN HCL IN DEXTROSE 750-5 MG/150ML-% IV SOLN
750.0000 mg | INTRAVENOUS | Status: DC
  Administered 2017-02-02: 750 mg via INTRAVENOUS
  Filled 2017-02-02: qty 150

## 2017-02-02 MED ORDER — FUROSEMIDE 10 MG/ML IJ SOLN
20.0000 mg | Freq: Two times a day (BID) | INTRAMUSCULAR | Status: DC
  Administered 2017-02-02 – 2017-02-03 (×2): 20 mg via INTRAVENOUS
  Filled 2017-02-02 (×2): qty 2

## 2017-02-02 MED ORDER — VANCOMYCIN HCL IN DEXTROSE 1-5 GM/200ML-% IV SOLN
1000.0000 mg | Freq: Once | INTRAVENOUS | Status: AC
  Administered 2017-02-02: 1000 mg via INTRAVENOUS
  Filled 2017-02-02: qty 200

## 2017-02-02 MED ORDER — METOPROLOL TARTRATE 5 MG/5ML IV SOLN
5.0000 mg | Freq: Once | INTRAVENOUS | Status: AC
  Administered 2017-02-02: 5 mg via INTRAVENOUS
  Filled 2017-02-02: qty 5

## 2017-02-02 MED ORDER — METOPROLOL SUCCINATE ER 25 MG PO TB24
25.0000 mg | ORAL_TABLET | Freq: Every day | ORAL | Status: DC
  Administered 2017-02-02: 25 mg via ORAL
  Filled 2017-02-02: qty 1

## 2017-02-02 NOTE — ED Notes (Signed)
Hearing aid found in bed linens while repositioning patient.  Hearing aid returned to patient and placed in specimen cup with a patient label on cup.  Cup placed in patient's purse by patient.

## 2017-02-02 NOTE — ED Notes (Signed)
Patient denies pain and is resting comfortably.  

## 2017-02-02 NOTE — Progress Notes (Signed)
CODE SEPSIS - PHARMACY COMMUNICATION  **Broad Spectrum Antibiotics should be administered within 1 hour of Sepsis diagnosis**  Time Code Sepsis Called/Page Received: 0801  Antibiotics Ordered: 0745  Time of 1st antibiotic administration: 8421  Additional action taken by pharmacy:   If necessary, Name of Provider/Nurse Contacted:     Napoleon Form ,PharmD Clinical Pharmacist  02/02/2017  8:01 AM

## 2017-02-02 NOTE — Consult Note (Signed)
Cardiology Consultation:   Patient ID: Krystal White; [id number]; 1924-05-29   Admit date: 02/02/2017 Date of Consult: 02/02/2017  Primary Care Provider: Patient, No Pcp Per Primary Cardiologist: new Fletcher Anon) Primary Electrophysiologist:  n/a   Patient Profile:   Krystal White is a 83 y.o. female with a hx of essential hypertension, hyperlipidemia and questionable history of atrial fibrillation who is being seen today for the evaluation of heart failure and A. fib with RVR at the request of Dr. Anselm Jungling.  History of Present Illness:   Krystal White is a 83 year old female with the above medical problems who presented with weakness and shortness of breath which started this morning.  No chest pain.  Atrial fibrillation is listed in her prior medical history although the patient denies any previous cardiac history.  She is overall a poor historian and hard of hearing.  She is independent and lives by herself.  She continues to drive. Her symptoms started this morning and she felt very dyspneic.  Upon arrival to the ER, she was noted to be in A. fib with RVR with heart rate ranging between 150-160 bpm.  She was mildly hypoxic and chest x-ray was consistent with heart failure.  She was given a diltiazem bolus and furosemide with subsequent improvement in her symptoms.  She feels better at the present time. She is a previous smoker.  Past Medical History:  Diagnosis Date  . Hypertension     History reviewed. No pertinent surgical history.   Home Medications:  Prior to Admission medications   Medication Sig Start Date End Date Taking? Authorizing Provider  fluticasone (FLONASE) 50 MCG/ACT nasal spray Place 2 sprays into the nose daily. 12/23/16 12/23/17 Yes [provider]  metoprolol succinate (TOPROL-XL) 25 MG 24 hr tablet Take 1 tablet by mouth daily. 10/27/13  Yes [provider]  simvastatin (ZOCOR) 20 MG tablet Take 1 tablet by mouth daily. 12/10/16  Yes  [provider]  traMADol (ULTRAM) 50 MG tablet Take 1 tablet (50 mg total) by mouth every 12 (twelve) hours as needed for moderate pain. Patient not taking: Reported on 02/02/2017 09/08/15   Sable Feil, PA-C    Inpatient Medications: Scheduled Meds: . furosemide  20 mg Intravenous Q12H  . [START ON 02/03/2017] metoprolol succinate  50 mg Oral Daily  . simvastatin  20 mg Oral q1800   Continuous Infusions: . piperacillin-tazobactam (ZOSYN)  IV    . vancomycin     PRN Meds: docusate sodium, fluticasone, metoprolol tartrate  Allergies:   No Known Allergies  Social History:   Social History   Socioeconomic History  . Marital status: Widowed    Spouse name: Not on file  . Number of children: Not on file  . Years of education: Not on file  . Highest education level: Not on file  Social Needs  . Financial resource strain: Not on file  . Food insecurity - worry: Not on file  . Food insecurity - inability: Not on file  . Transportation needs - medical: Not on file  . Transportation needs - non-medical: Not on file  Occupational History  . Not on file  Tobacco Use  . Smoking status: Never Smoker  . Smokeless tobacco: Never Used  Substance and Sexual Activity  . Alcohol use: No  . Drug use: Not on file  . Sexual activity: Not on file  Other Topics Concern  . Not on file  Social History Narrative  . Not on file  Family History:    Family History  Problem Relation Age of Onset  . Heart failure Sister      ROS:  Please see the history of present illness.  ROS  All other ROS reviewed and negative.     Physical Exam/Data:   Vitals:   02/02/17 1000 02/02/17 1030 02/02/17 1104 02/02/17 1127  BP: (!) 150/121 139/80 (!) 151/73 (!) 146/77  Pulse: (!) 138  90 (!) 110  Resp: 18 20 16 18   Temp:    98.3 F (36.8 C)  TempSrc:      SpO2: 96%  96% (!) 88%  Weight:      Height:        Intake/Output Summary (Last 24 hours) at 02/02/2017 1509 Last data  filed at 02/02/2017 1300 Gross per 24 hour  Intake 250 ml  Output 2100 ml  Net -1850 ml   Filed Weights   02/02/17 0740  Weight: 117 lb (53.1 kg)   Body mass index is 22.11 kg/m.  General:  Well nourished, frail elderly woman, in no acute distress HEENT: normal Lymph: no adenopathy Neck: Significant JVD Endocrine:  No thryomegaly Vascular: No carotid bruits; FA pulses 2+ bilaterally without bruits  Cardiac:  normal S1, S2; irregularly irregular and mildly tachycardic; 1 out of 6 systolic murmur at the base Lungs: Decreased breath sounds at the right base with bibasilar crackles Abd: soft, nontender, no hepatomegaly  Ext: no edema Musculoskeletal:  No deformities, BUE and BLE strength normal and equal Skin: warm and dry  Neuro:  CNs 2-12 intact, no focal abnormalities noted Psych:  Normal affect   EKG:  The EKG was personally reviewed and demonstrates: Atrial fibrillation with RVR and nonspecific ST changes related to tachycardia Telemetry:  Telemetry was personally reviewed and demonstrates: Atrial fibrillation with heart rate between 100-120 bpm  Relevant CV Studies: Echocardiogram done today was personally reviewed by me:  - Left ventricle: The cavity size was normal. Wall thickness was   normal. Systolic function was normal. The estimated ejection   fraction was in the range of 55% to 60%. Wall motion was normal;   there were no regional wall motion abnormalities. - Aortic valve: There was mild regurgitation. - Mitral valve: There was mild regurgitation. - Left atrium: The atrium was mildly dilated. - Right ventricle: The cavity size was moderately dilated. Wall   thickness was increased. Systolic function was mildly reduced. - Right atrium: The atrium was severely dilated. - Tricuspid valve: There was moderate regurgitation. - Pulmonary arteries: Systolic pressure was severely increased. PA   peak pressure: 65 mm Hg (S). - Inferior vena cava: The vessel was dilated.  The respirophasic   diameter changes were blunted (< 50%), consistent with elevated   central venous pressure. - Pericardium, extracardiac: A trivial pericardial effusion was   identified posterior to the heart.  Laboratory Data:  Chemistry Recent Labs  Lab 02/02/17 0739  NA 134*  K 3.6  CL 97*  CO2 27  GLUCOSE 114*  BUN 16  CREATININE 0.70  CALCIUM 8.8*  GFRNONAA >60  GFRAA >60  ANIONGAP 10    Recent Labs  Lab 02/02/17 0739  PROT 6.7  ALBUMIN 2.9*  AST 49*  ALT 54  ALKPHOS 68  BILITOT 1.1   Hematology Recent Labs  Lab 02/02/17 0739  WBC 8.8  RBC 3.98  HGB 12.7  HCT 37.3  MCV 93.8  MCH 32.0  MCHC 34.1  RDW 13.8  PLT 299   Cardiac Enzymes Recent  Labs  Lab 02/02/17 0739 02/02/17 1000 02/02/17 1332  TROPONINI 0.03* 0.03* <0.03   No results for input(s): TROPIPOC in the last 168 hours.  BNPNo results for input(s): BNP, PROBNP in the last 168 hours.  DDimer No results for input(s): DDIMER in the last 168 hours.  Radiology/Studies:  Dg Chest Port 1 View  Result Date: 02/02/2017 CLINICAL DATA:  Shortness of breath. EXAM: PORTABLE CHEST 1 VIEW COMPARISON:  06/23/2013 FINDINGS: The heart size and mediastinal contours are within normal limits. There is evidence of moderate congestive heart failure and bilateral pleural effusions, right greater than left. The visualized skeletal structures are unremarkable. IMPRESSION: Moderate CHF with bilateral pleural effusions, right greater than left. Electronically Signed   By: Aletta Edouard M.D.   On: 02/02/2017 08:08    Assessment and Plan:   1. Atrial fibrillation with rapid ventricular response: The exact onset is not entirely clear.  I agree with rate control with metoprolol which should be uptitrated as needed.  If heart rate starts going up, consider using diltiazem drip for short-term rate control.  I am going to start the patient on unfractionated heparin for now. CHADS VASc score is 5.  Thus, we should  strongly consider long-term anticoagulation likely with low-dose Eliquis.  I tried to discuss this with the patient but she did not seem to have a good understanding of the risks and benefits.  We should try to approach the family. 2. Acute diastolic heart failure likely in the setting of atrial fibrillation with rapid ventricular response.  Echocardiogram showed significant pulmonary hypertension and right-sided enlargement.  I suspect that this is likely due to left-sided heart failure although pulmonary embolism cannot be completely excluded.  I requested to start heparin for now.  The patient is volume overloaded and I agree with furosemide 20 mg intravenously twice daily.   For questions or updates, please contact [place] Please consult [email or web address] for contact info under Cardiology/STEMI.   Signed, Kathlyn Sacramento, MD  02/02/2017 3:09 PM

## 2017-02-02 NOTE — Progress Notes (Addendum)
ANTICOAGULATION CONSULT NOTE - Initial Consult  Pharmacy Consult for heparin Indication: atrial fibrillation  No Known Allergies  Patient Measurements: Height: 5\' 1"  (154.9 cm) Weight: 117 lb (53.1 kg) IBW/kg (Calculated) : 47.8 Heparin Dosing Weight: 53.1 kg  Vital Signs: Temp: 98.3 F (36.8 C) (12/10 1127) Temp Source: Oral (12/10 0739) BP: 146/77 (12/10 1127) Pulse Rate: 110 (12/10 1127)  Labs: Recent Labs    02/02/17 0739 02/02/17 1000 02/02/17 1332  HGB 12.7  --   --   HCT 37.3  --   --   PLT 299  --   --   CREATININE 0.70  --   --   TROPONINI 0.03* 0.03* <0.03    Estimated Creatinine Clearance: 35.3 mL/min (by C-G formula based on SCr of 0.7 mg/dL).   Medical History: Past Medical History:  Diagnosis Date  . Hypertension     Medications:  Infusions:  . heparin    . piperacillin-tazobactam (ZOSYN)  IV    . vancomycin      Assessment: 86 yof admitted with AF RVR. No PTA OAC noted. Pharmacy consulted to dose heparin for AF.   Goal of Therapy:  Heparin level 0.3-0.7 units/ml Monitor platelets by anticoagulation protocol: Yes   Plan:  Give 2700 units bolus x 1 Start heparin infusion at 800 units/hr Check anti-Xa level in 8 hours and daily while on heparin Continue to monitor H&H and platelets  02/02/17 1607 aPTT 88 and INR 1.2. Elevated aPTT likely related to heparin administration starting before baseline labs drawn.  Laural Benes, Pharm.D., BCPS Clinical Pharmacist 02/02/2017,3:13 PM    12/11 0000 heparin level 0.29. 800 unit bolus and increase rate to 900 units/hr. Recheck in 8 hours.  Sim Boast, PharmD, BCPS  02/03/17 2:51 AM

## 2017-02-02 NOTE — ED Provider Notes (Signed)
[facility] Emergency Department Provider Note  ____________________________________________  Time seen: Approximately 7:39 AM  I have reviewed the triage vital signs and the nursing notes.   HISTORY  Chief Complaint Shortness of Breath     HPI Krystal White is a 83 y.o. female with a history of hypertension and hyperlipidemia and atrial fibrillation brought to the ED via EMS from home who complains of shortness of breath and nonproductive cough for the past month.Symptoms are constant, worse with exertion. More short of breath lying down flat for this past month. Patient is more comfortable and breathes more easily when sitting upright which alleviates her symptoms somewhat. Denies pain. Never had anything like this before. Denies history of heart failure.  EMS report noting that she is in atrial fibrillation, rate ranging from 1:30 to 200. Patient is not on home oxygen. EMS report an oral temperature of 99.7.      Past Medical History:  Diagnosis Date  . Hypertension   Hyperlipidemia Atrial fibrillation Hearing loss   There are no active problems to display for this patient.    Past surgical history: Surgical History   Surgery Date Laterality Comments  HERNIA REPAIR 2005  Right inguinal hernia repair.  Breast biopsy         Prior to Admission medications   Medication Sig Start Date End Date Taking? Authorizing Provider  fluticasone (FLONASE) 50 MCG/ACT nasal spray Place 2 sprays into the nose daily. 12/23/16 12/23/17 Yes [provider]  metoprolol succinate (TOPROL-XL) 25 MG 24 hr tablet Take 1 tablet by mouth daily. 10/27/13  Yes [provider]  simvastatin (ZOCOR) 20 MG tablet Take 1 tablet by mouth daily. 12/10/16  Yes [provider]  traMADol (ULTRAM) 50 MG tablet Take 1 tablet (50 mg total) by mouth every 12 (twelve) hours as needed for moderate pain. Patient not taking: Reported on 02/02/2017  09/08/15   Sable Feil, PA-C  Medications   Medication Sig Dispensed Refills Start Date End Date Status  aspirin 81 MG EC tablet  Take 81 mg by mouth once daily.  0   Active  multivitamin tablet  Take 1 tablet by mouth once daily.  0   Active  calcium carbonate-vitamin D3 (CALTRATE 600+D) 600 mg(1,500mg ) -200 unit tablet  Take 1 tablet by mouth 2 (two) times daily with meals.  0   Active  cholecalciferol (VITAMIN D3) 2,000 unit capsule  Take 2,000 Units by mouth once daily.  0   Active  metoprolol succinate (TOPROL-XL) 25 MG XL tablet  TAKE ONE TABLET BY MOUTH EVERY DAY 90 tablet  1 12/10/2016  Active  simvastatin (ZOCOR) 20 MG tablet  TAKE ONE (1) TABLET EACH DAY 90 tablet  1 12/10/2016  Active  fluticasone (FLONASE) 50 mcg/actuation nasal spray  Place 2 sprays into both nostrils once daily 16 g  1         Allergies Patient has no known allergies.   No family history on file.  Social History Social History   Tobacco Use  . Smoking status: Never Smoker  . Smokeless tobacco: Never Used  Substance Use Topics  . Alcohol use: No  . Drug use: Not on file  No drug use  Review of Systems  Constitutional:   No fever or chills.  ENT:   No sore throat. No rhinorrhea. Cardiovascular:   No chest pain or syncope. Respiratory:   Positive shortness of breath and cough as above. Gastrointestinal:   Negative for abdominal  pain, vomiting and diarrhea.  Musculoskeletal:   No pain, positive peripheral edema for the past month. All other systems reviewed and are negative except as documented above in ROS and HPI.  ____________________________________________   PHYSICAL EXAM:  VITAL SIGNS: ED Triage Vitals  Enc Vitals Group     BP      Pulse      Resp      Temp      Temp src      SpO2      Weight      Height      Head Circumference      Peak Flow      Pain Score      Pain Loc      Pain Edu?      Excl. in [place]?   Oxygen saturation 79% on room air on  my exam  Vital signs reviewed, nursing assessments reviewed.   Constitutional:   Alert and oriented. Ill appearing but not in distress. Eyes:   No scleral icterus.  EOMI. No nystagmus. No conjunctival pallor. PERRL. ENT   Head:   Normocephalic and atraumatic.   Nose:   No congestion/rhinnorhea.    Mouth/Throat:   Dry mucous membranes, no pharyngeal erythema. No peritonsillar mass.    Neck:   No meningismus. Full ROM. Trachea midline Hematological/Lymphatic/Immunilogical:   No cervical lymphadenopathy. Cardiovascular:   Irregularly irregular rhythm, rate of about 160. Symmetric bilateral radial and DP pulses.  No murmurs.  Respiratory:   Tachypnea, no accessory muscle use. Diffuse crackles bilateral lower lungs. Symmetric air entry in all lung fields.. Gastrointestinal:   Soft and nontender. Non distended. There is no CVA tenderness.  No rebound, rigidity, or guarding. Genitourinary:   deferred Musculoskeletal:   Normal range of motion in all extremities. No joint effusions.  No lower extremity tenderness. 1+ pitting edema bilateral lower extremities Neurologic:   Normal speech and language.  Motor grossly intact. No gross focal neurologic deficits are appreciated.  Skin:    Skin is warm, dry and intact. No rash noted.  No petechiae, purpura, or bullae.  ____________________________________________    LABS (pertinent positives/negatives) (all labs ordered are listed, but only abnormal results are displayed) Labs Reviewed  COMPREHENSIVE METABOLIC PANEL - Abnormal; Notable for the following components:      Result Value   Sodium 134 (*)    Chloride 97 (*)    Glucose, Bld 114 (*)    Calcium 8.8 (*)    Albumin 2.9 (*)    AST 49 (*)    All other components within normal limits  TROPONIN I - Abnormal; Notable for the following components:   Troponin I 0.03 (*)    All other components within normal limits  CBC WITH DIFFERENTIAL/PLATELET - Abnormal; Notable for the  following components:   Neutro Abs 7.0 (*)    Lymphs Abs 0.8 (*)    All other components within normal limits  URINALYSIS, COMPLETE (UACMP) WITH MICROSCOPIC - Abnormal; Notable for the following components:   Color, Urine YELLOW (*)    APPearance CLEAR (*)    Squamous Epithelial / LPF 0-5 (*)    All other components within normal limits  CULTURE, BLOOD (ROUTINE X 2)  CULTURE, BLOOD (ROUTINE X 2)  URINE CULTURE  LACTIC ACID, PLASMA  LIPASE, BLOOD  MAGNESIUM  PROCALCITONIN   ____________________________________________   EKG  Interpreted by me Atrial fibrillation rate of 155. Normal axis and intervals. Normal QRS ST segments and T waves.  Prehospital  EKG obtained by EMS reviewed and interpreted by me, similar findings. Atrial fibrillation, rate of 169, normal axis intervals and no acute ischemic changes.  ____________________________________________    RADIOLOGY  Dg Chest Port 1 View  Result Date: 02/02/2017 CLINICAL DATA:  Shortness of breath. EXAM: PORTABLE CHEST 1 VIEW COMPARISON:  06/23/2013 FINDINGS: The heart size and mediastinal contours are within normal limits. There is evidence of moderate congestive heart failure and bilateral pleural effusions, right greater than left. The visualized skeletal structures are unremarkable. IMPRESSION: Moderate CHF with bilateral pleural effusions, right greater than left. Electronically Signed   By: Aletta Edouard M.D.   On: 02/02/2017 08:08    ____________________________________________   PROCEDURES Procedures CRITICAL CARE Performed by: Joni Fears, Dalin Caldera   Total critical care time: 35 minutes  Critical care time was exclusive of separately billable procedures and treating other patients.  Critical care was necessary to treat or prevent imminent or life-threatening deterioration.  Critical care was time spent personally by me on the following activities: development of treatment plan with patient and/or surrogate as  well as nursing, discussions with consultants, evaluation of patient's response to treatment, examination of patient, obtaining history from patient or surrogate, ordering and performing treatments and interventions, ordering and review of laboratory studies, ordering and review of radiographic studies, pulse oximetry and re-evaluation of patient's condition.  ____________________________________________   DIFFERENTIAL DIAGNOSIS  Multifocal pneumonia with sepsis, congestive heart failure, atrial fibrillation with rapid ventricular response  CLINICAL IMPRESSION / ASSESSMENT AND PLAN / ED COURSE  Pertinent labs & imaging results that were available during my care of the patient were reviewed by me and considered in my medical decision making (see chart for details).   Patient is not in severe distress, but not well appearing. Presents with orthopnea, cough, bilateral basilar crackles, peripheral edema. If this is early sepsis she would benefit from IV fluid boluses, and her tachycardia would be compensatory and therefore he would be detrimental to give her rate control agents such as diltiazem. However, this is heart failure due to atrial fibrillation or non-STEMI, rate control would be beneficial and she would need diuretics and sedative fluids. I'll therefore hold off on pharmacological management until doing chest x-ray to help guide therapy and hone differential until labs can be available.  Code sepsis initiated immediately upon assessment due to fever, hypoxic respiratory failure, tachycardia, suspected pneumonia, anticipated admission.  Clinical Course as of Feb 03 907  Mon Feb 02, 2017  2694 Cxr viewed by me when taken. C/w pulm. Edema. Will give lasix, diltiazem. Bp 180/90  [PS]  0830 Radiology report reviewed, agree with finding of chf / pulm edema / pl. effusions  [PS]    Clinical Course User Index [PS] Carrie Mew, MD     ----------------------------------------- 9:08  AM on 02/02/2017 -----------------------------------------  Rate controlled with diltiazem bolus. Blood pressure stable. Still requiring supplemental oxygen. Labs reassuring. Case discussed with the hospitalists for further management of new onset heart failure with pulmonary edema and hypoxic respiratory failure. Troponin very slightly elevated at 0.03, not indicative of non-STEMI at this time or unstable angina, would not anticoagulate at this time..  ____________________________________________   FINAL CLINICAL IMPRESSION(S) / ED DIAGNOSES    Final diagnoses:  Atrial fibrillation with rapid ventricular response ([place])  New onset of congestive heart failure ([facility])  Acute pulmonary edema (H[facility])  Acute respiratory failure with hypoxia (H[facility])      This SmartLink is deprecated. Use AVSMEDLIST instead to display the medication list for a  patient.   Portions of this note were generated with dragon dictation software. Dictation errors may occur despite best attempts at proofreading.    Carrie Mew, MD 02/02/17 [phone]

## 2017-02-02 NOTE — Progress Notes (Signed)
Need admission for A fib RVR and new onset CHF Pt is DNR.

## 2017-02-02 NOTE — ED Notes (Signed)
AAOx3.  Patient looking for hearing aid.  Looked in Red plaid flannel shirt, not found.  Looked in bed and linens, no found.  EMS called to look for hearing aid in ambulance.  Patient states she had been wearing a house coat at home and it may be in the house coat.

## 2017-02-02 NOTE — ED Triage Notes (Signed)
Arrives from home via [facility].  Patient called EMS this morning for c/o 1 month history of orthopnea and cough.  On EMS arrival, patient found to be in Afib with RVR rate 140-200.  Room Air O2Sats 80's.  Bilateral LE edema and crackles auscultated bibasilar.

## 2017-02-02 NOTE — Progress Notes (Signed)
Pharmacy Antibiotic Note  Krystal White is a 83 y.o. female admitted on 02/02/2017 with sepsis.  Pharmacy has been consulted for vancomycin and piperacillin/tazobactam dosing.  Piperacillin/tazobactam 3.375 g and vancomycin 1000 mg were received in ED.  Plan: Piperacillin/tazobactam 3.375 g IV q8h EI  Will order maintenance dose of vancomycin  750 mg IV q12h (12 hour stacked dose) to start this evening Goal VT 15-20 mcg/mL  Kinetics: Using actual body weight = 53 kg, CrCl = 40 mL/min Ke: 0.040 Half-life: 17 hrs Vd: 37 L Cmin (estimated): ~16 mcg/mL  Height: 5\' 1"  (154.9 cm) Weight: 117 lb (53.1 kg) IBW/kg (Calculated) : 47.8  Temp (24hrs), Avg:98.5 F (36.9 C), Min:98.5 F (36.9 C), Max:98.5 F (36.9 C)  Recent Labs  Lab 02/02/17 0739  WBC 8.8  CREATININE 0.70  LATICACIDVEN 1.8    Estimated Creatinine Clearance: 35.3 mL/min (by C-G formula based on SCr of 0.7 mg/dL).    No Known Allergies  Antimicrobials this admission: Piperacillin/tazobactam 12/10 Vancomycin 12/10 >>   Dose adjustments this admission:  Microbiology results: 12/10 BCx: Send 12/10 UCx: Sent  Thank you for allowing pharmacy to be a part of this patient's care.  Lenis Noon, PharmD, BCPS Clinical Pharmacist 02/02/2017 10:57 AM

## 2017-02-02 NOTE — Progress Notes (Signed)
*  PRELIMINARY RESULTS* Echocardiogram 2D Echocardiogram has been performed.  Krystal White 02/02/2017, 2:22 PM

## 2017-02-02 NOTE — H&P (Signed)
[facility] [place] NAME: Krystal White    MR#:  [id number]  DATE OF BIRTH:  1924-05-26  DATE OF ADMISSION:  02/02/2017  PRIMARY CARE PHYSICIAN: Patient, No Pcp Per   REQUESTING/REFERRING PHYSICIAN: stafford  CHIEF COMPLAINT:   Chief Complaint  Patient presents with  . Shortness of Breath    HISTORY OF PRESENT ILLNESS:  Krystal White is a 83 year old female with the above medical problems who presented with weakness and shortness of breath which started this morning.  No chest pain.  Atrial fibrillation is listed in her prior medical history although the patient denies any previous cardiac history.  She is overall a poor historian and hard of hearing.  She is independent and lives by herself.  She continues to drive. Her symptoms started this morning and she felt very dyspneic.  Upon arrival to the ER, she was noted to be in A. fib with RVR with heart rate ranging between 150-160 bpm.  She was mildly hypoxic and chest x-ray was consistent with heart failure.  She was given a diltiazem bolus and furosemide with subsequent improvement in her symptoms.  She feels better at the present time. She is a previous smoker.  PAST MEDICAL HISTORY:   Past Medical History:  Diagnosis Date  . Hypertension     PAST SURGICAL HISTORY: History reviewed. No pertinent surgical history.  SOCIAL HISTORY:  Social History   Tobacco Use  . Smoking status: Never Smoker  . Smokeless tobacco: Never Used  Substance Use Topics  . Alcohol use: No    FAMILY HISTORY:  Family History  Problem Relation Age of Onset  . Heart failure Sister     DRUG ALLERGIES: No Known Allergies  REVIEW OF SYSTEMS:   CONSTITUTIONAL: No fever, fatigue or weakness.  EYES: No blurred or double vision.  EARS, NOSE, AND THROAT: No tinnitus or ear pain.  RESPIRATORY: No cough,have shortness of breath, no wheezing or hemoptysis.  CARDIOVASCULAR: No chest pain, orthopnea, edema.   GASTROINTESTINAL: No nausea, vomiting, diarrhea or abdominal pain.  GENITOURINARY: No dysuria, hematuria.  ENDOCRINE: No polyuria, nocturia,  HEMATOLOGY: No anemia, easy bruising or bleeding SKIN: No rash or lesion. MUSCULOSKELETAL: No joint pain or arthritis.   NEUROLOGIC: No tingling, numbness, weakness.  PSYCHIATRY: No anxiety or depression.   MEDICATIONS AT HOME:  Prior to Admission medications   Medication Sig Start Date End Date Taking? Authorizing Provider  fluticasone (FLONASE) 50 MCG/ACT nasal spray Place 2 sprays into the nose daily. 12/23/16 12/23/17 Yes [provider]  metoprolol succinate (TOPROL-XL) 25 MG 24 hr tablet Take 1 tablet by mouth daily. 10/27/13  Yes [provider]  simvastatin (ZOCOR) 20 MG tablet Take 1 tablet by mouth daily. 12/10/16  Yes [provider]  traMADol (ULTRAM) 50 MG tablet Take 1 tablet (50 mg total) by mouth every 12 (twelve) hours as needed for moderate pain. Patient not taking: Reported on 02/02/2017 09/08/15   Sable Feil, PA-C      PHYSICAL EXAMINATION:   VITAL SIGNS: Blood pressure 139/68, pulse (!) 113, temperature 98.3 F (36.8 C), resp. rate 18, height 5\' 1"  (1.549 m), weight 53.1 kg (117 lb), SpO2 95 %.  GENERAL:  83 y.o.-year-old patient lying in the bed with no acute distress. Hard o hearing. EYES: Pupils equal, round, reactive to light and accommodation. No scleral icterus. Extraocular muscles intact.  HEENT: Head atraumatic, normocephalic. Oropharynx and nasopharynx clear.  NECK:  Supple, no jugular venous  distention. No thyroid enlargement, no tenderness.  LUNGS: Normal breath sounds bilaterally, no wheezing, b/l crepitation. No use of accessory muscles of respiration.  CARDIOVASCULAR: S1, S2 fast. No murmurs, rubs, or gallops.  ABDOMEN: Soft, nontender, nondistended. Bowel sounds present. No organomegaly or mass.  EXTREMITIES: b/l pedal edema,no cyanosis, or clubbing.  NEUROLOGIC: Cranial nerves  II through XII are intact. Muscle strength 5/5 in all extremities. Sensation intact. Gait not checked.  PSYCHIATRIC: The patient is alert and oriented x 3.  SKIN: No obvious rash, lesion, or ulcer.   LABORATORY PANEL:   CBC Recent Labs  Lab 02/02/17 0739  WBC 8.8  HGB 12.7  HCT 37.3  PLT 299  MCV 93.8  MCH 32.0  MCHC 34.1  RDW 13.8  LYMPHSABS 0.8*  MONOABS 0.7  EOSABS 0.2  BASOSABS 0.1   ------------------------------------------------------------------------------------------------------------------  Chemistries  Recent Labs  Lab 02/02/17 0739  NA 134*  K 3.6  CL 97*  CO2 27  GLUCOSE 114*  BUN 16  CREATININE 0.70  CALCIUM 8.8*  MG 1.7  AST 49*  ALT 54  ALKPHOS 68  BILITOT 1.1   ------------------------------------------------------------------------------------------------------------------ estimated creatinine clearance is 35.3 mL/min (by C-G formula based on SCr of 0.7 mg/dL). ------------------------------------------------------------------------------------------------------------------ No results for input(s): TSH, T4TOTAL, T3FREE, THYROIDAB in the last 72 hours.  Invalid input(s): FREET3   Coagulation profile Recent Labs  Lab 02/02/17 1607  INR 1.20   ------------------------------------------------------------------------------------------------------------------- No results for input(s): DDIMER in the last 72 hours. -------------------------------------------------------------------------------------------------------------------  Cardiac Enzymes Recent Labs  Lab 02/02/17 0739 02/02/17 1000 02/02/17 1332  TROPONINI 0.03* 0.03* <0.03   ------------------------------------------------------------------------------------------------------------------ Invalid input(s): POCBNP  ---------------------------------------------------------------------------------------------------------------  Urinalysis    Component Value Date/Time    COLORURINE YELLOW (A) 02/02/2017 0739   APPEARANCEUR CLEAR (A) 02/02/2017 0739   APPEARANCEUR Hazy 06/23/2013 1233   LABSPEC 1.009 02/02/2017 0739   LABSPEC 1.021 06/23/2013 1233   PHURINE 7.0 02/02/2017 0739   GLUCOSEU NEGATIVE 02/02/2017 0739   GLUCOSEU Negative 06/23/2013 1233   HGBUR NEGATIVE 02/02/2017 0739   BILIRUBINUR NEGATIVE 02/02/2017 0739   BILIRUBINUR Negative 06/23/2013 1233   KETONESUR NEGATIVE 02/02/2017 0739   PROTEINUR NEGATIVE 02/02/2017 0739   NITRITE NEGATIVE 02/02/2017 0739   LEUKOCYTESUR NEGATIVE 02/02/2017 0739   LEUKOCYTESUR 2+ 06/23/2013 1233     RADIOLOGY: Dg Chest Port 1 View  Result Date: 02/02/2017 CLINICAL DATA:  Shortness of breath. EXAM: PORTABLE CHEST 1 VIEW COMPARISON:  06/23/2013 FINDINGS: The heart size and mediastinal contours are within normal limits. There is evidence of moderate congestive heart failure and bilateral pleural effusions, right greater than left. The visualized skeletal structures are unremarkable. IMPRESSION: Moderate CHF with bilateral pleural effusions, right greater than left. Electronically Signed   By: Aletta Edouard M.D.   On: 02/02/2017 08:08    EKG: Orders placed or performed during the hospital encounter of 02/02/17  . EKG 12-Lead  . EKG 12-Lead    IMPRESSION AND PLAN:  * Ac diastolic CHF   IV lasix, I/O monitor,    Echo.   Cardio consult  * A fib with RVR   IV cardizem given, cont IV metoprolol as needed    Check Trop, monitor tele.   Echo and cardio consult.  *  Hypertension   Cont metoprolol.   All the records are reviewed and case discussed with ED provider. Management plans discussed with the patient, family and they are in agreement.  CODE STATUS: DNR    Code Status Orders  (From admission, onward)  Start     Ordered   02/02/17 1146  Do not attempt resuscitation (DNR)  Continuous    Question Answer Comment  In the event of cardiac or respiratory ARREST Do not call a "code blue"    In the event of cardiac or respiratory ARREST Do not perform Intubation, CPR, defibrillation or ACLS   In the event of cardiac or respiratory ARREST Use medication by any route, position, wound care, and other measures to relive pain and suffering. May use oxygen, suction and manual treatment of airway obstruction as needed for comfort.      02/02/17 1146    Code Status History    Date Active Date Inactive Code Status Order ID Comments User Context   This patient has a current code status but no historical code status.    Advance Directive Documentation     Most Recent Value  Type of Advance Directive  Living will  Pre-existing out of facility DNR order (yellow form or pink MOST form)  No data  "MOST" Form in Place?  No data       TOTAL TIME TAKING CARE OF THIS PATIENT: 50 minutes.    [name] M.D on 02/02/2017   Between 7am to 6pm - Pager - [phone]  After 6pm go to [email or web address] - password [email or web address] [facility]  Office  [phone]  CC: Primary care physician; Patient, No Pcp Per   Note: This dictation was prepared with Dragon dictation along with smaller phrase technology. Any transcriptional errors that result from this process are unintentional.

## 2017-02-02 NOTE — ED Notes (Signed)
Attempted to call report to floor 

## 2017-02-03 ENCOUNTER — Inpatient Hospital Stay: Payer: Medicare Other

## 2017-02-03 LAB — URINE CULTURE: Culture: <10000 — AB

## 2017-02-03 LAB — CBC
HEMATOCRIT: 39.5 % (ref 35.0–47.0)
HEMOGLOBIN: 13.3 g/dL (ref 12.0–16.0)
MCH: 31.4 pg (ref 26.0–34.0)
MCHC: 33.6 g/dL (ref 32.0–36.0)
MCV: 93.6 fL (ref 80.0–100.0)
Platelets: 313 10*3/uL (ref 150–440)
RBC: 4.22 MIL/uL (ref 3.80–5.20)
RDW: 13.7 % (ref 11.5–14.5)
WBC: 9.6 10*3/uL (ref 3.6–11.0)

## 2017-02-03 LAB — BASIC METABOLIC PANEL
ANION GAP: 14 (ref 5–15)
BUN: 15 mg/dL (ref 6–20)
CALCIUM: 8.8 mg/dL — AB (ref 8.9–10.3)
CO2: 33 mmol/L — AB (ref 22–32)
Chloride: 91 mmol/L — ABNORMAL LOW (ref 101–111)
Creatinine, Ser: 0.83 mg/dL (ref 0.44–1.00)
GFR calc Af Amer: >60 mL/min (ref >60)
GFR calc non Af Amer: >60 mL/min (ref >60)
GLUCOSE: 112 mg/dL — AB (ref 65–99)
POTASSIUM: 2.8 mmol/L — AB (ref 3.5–5.1)
Sodium: 138 mmol/L (ref 135–145)

## 2017-02-03 LAB — HEPARIN LEVEL (UNFRACTIONATED)
Heparin Unfractionated: 0.29 IU/mL — ABNORMAL LOW (ref 0.30–0.70)
Heparin Unfractionated: 0.35 IU/mL (ref 0.30–0.70)
Heparin Unfractionated: 0.37 IU/mL (ref 0.30–0.70)

## 2017-02-03 LAB — FIBRIN DERIVATIVES D-DIMER (ARMC ONLY): Fibrin derivatives D-dimer (ARMC): 822.09 ng/mL (FEU) — ABNORMAL HIGH (ref 0.00–499.00)

## 2017-02-03 MED ORDER — FUROSEMIDE 10 MG/ML IJ SOLN
20.0000 mg | Freq: Every day | INTRAMUSCULAR | Status: DC
  Administered 2017-02-04 – 2017-02-06 (×3): 20 mg via INTRAVENOUS
  Filled 2017-02-03 (×3): qty 2

## 2017-02-03 MED ORDER — HEPARIN BOLUS VIA INFUSION
800.0000 [IU] | Freq: Once | INTRAVENOUS | Status: AC
  Administered 2017-02-03: 800 [IU] via INTRAVENOUS
  Filled 2017-02-03: qty 800

## 2017-02-03 MED ORDER — IOPAMIDOL (ISOVUE-370) INJECTION 76%
60.0000 mL | Freq: Once | INTRAVENOUS | Status: AC | PRN
  Administered 2017-02-03: 60 mL via INTRAVENOUS

## 2017-02-03 MED ORDER — POTASSIUM CHLORIDE CRYS ER 20 MEQ PO TBCR
40.0000 meq | EXTENDED_RELEASE_TABLET | Freq: Two times a day (BID) | ORAL | Status: DC
  Administered 2017-02-03 – 2017-02-06 (×7): 40 meq via ORAL
  Filled 2017-02-03 (×7): qty 2

## 2017-02-03 NOTE — Progress Notes (Signed)
Progress Note  Patient Name: Krystal White Date of Encounter: 02/03/2017  Primary Cardiologist: Kathlyn Sacramento, MD  Subjective   No chest pain, palpitations, or shortness of breath.  Remains in atrial fibrillation with rates in the 1 teens to 120s.  Metoprolol was increased to 50 mg yesterday, but she did not receive an additional 25.  She is very hard of hearing.  Inpatient Medications    Scheduled Meds: . furosemide  20 mg Intravenous Q12H  . metoprolol succinate  50 mg Oral Daily  . potassium chloride  40 mEq Oral BID  . simvastatin  20 mg Oral q1800   Continuous Infusions: . heparin 900 Units/hr (02/03/17 0302)   PRN Meds: docusate sodium, fluticasone, metoprolol tartrate   Vital Signs    Vitals:   02/02/17 1923 02/03/17 0409 02/03/17 0729 02/03/17 0820  BP: (!) 143/70 (!) 144/93 131/80   Pulse: (!) 120 (!) 104 (!) 110 (!) 125  Resp: 18 17 18    Temp: 98.2 F (36.8 C) 97.7 F (36.5 C) (!) 97.1 F (36.2 C)   TempSrc: Oral Oral Oral   SpO2: 94% 96% 97%   Weight:      Height:        Intake/Output Summary (Last 24 hours) at 02/03/2017 1132 Last data filed at 02/03/2017 0200 Gross per 24 hour  Intake 333.73 ml  Output 1750 ml  Net -1416.27 ml   Filed Weights   02/02/17 0740  Weight: 117 lb (53.1 kg)    Physical Exam   GEN: Well nourished, well developed, in no acute distress.  HEENT: Grossly normal.  Neck: Supple, no JVD, carotid bruits, or masses. Cardiac: Irregularly irregular, tachycardic, no murmurs, rubs, or gallops. No clubbing, cyanosis, edema.  Radials/DP/PT 2+ and equal bilaterally.  Respiratory:  Respirations regular and unlabored, diminished breath sounds with bibasilar crackles. GI: Soft, nontender, nondistended, BS + x 4. MS: no deformity or atrophy. Skin: warm and dry, no rash. Neuro:  Strength and sensation are intact. Psych: AAOx3.  Normal affect.  Labs    Chemistry Recent Labs  Lab 02/02/17 0739 02/03/17 0428  NA 134*  138  K 3.6 2.8*  CL 97* 91*  CO2 27 33*  GLUCOSE 114* 112*  BUN 16 15  CREATININE 0.70 0.83  CALCIUM 8.8* 8.8*  PROT 6.7  --   ALBUMIN 2.9*  --   AST 49*  --   ALT 54  --   ALKPHOS 68  --   BILITOT 1.1  --   GFRNONAA >60 >60  GFRAA >60 >60  ANIONGAP 10 14     Hematology Recent Labs  Lab 02/02/17 0739 02/03/17 0428  WBC 8.8 9.6  RBC 3.98 4.22  HGB 12.7 13.3  HCT 37.3 39.5  MCV 93.8 93.6  MCH 32.0 31.4  MCHC 34.1 33.6  RDW 13.8 13.7  PLT 299 313    Cardiac Enzymes Recent Labs  Lab 02/02/17 0739 02/02/17 1000 02/02/17 1332 02/02/17 1810  TROPONINI 0.03* 0.03* <0.03 0.03*     Radiology    Dg Chest Port 1 View  Result Date: 02/02/2017 CLINICAL DATA:  Shortness of breath. EXAM: PORTABLE CHEST 1 VIEW COMPARISON:  06/23/2013 FINDINGS: The heart size and mediastinal contours are within normal limits. There is evidence of moderate congestive heart failure and bilateral pleural effusions, right greater than left. The visualized skeletal structures are unremarkable. IMPRESSION: Moderate CHF with bilateral pleural effusions, right greater than left. Electronically Signed   By: Jenness Corner.D.  On: 02/02/2017 08:08    Telemetry    Atrial fibrillation, 1 teens to 120s- Personally Reviewed  Cardiac Studies   2D Echocardiogram 12.10.2018  Study Conclusions   - Left ventricle: The cavity size was normal. Wall thickness was   normal. Systolic function was normal. The estimated ejection   fraction was in the range of 55% to 60%. Wall motion was normal;   there were no regional wall motion abnormalities. - Aortic valve: There was mild regurgitation. - Mitral valve: There was mild regurgitation. - Left atrium: The atrium was mildly dilated. - Right ventricle: The cavity size was moderately dilated. Wall   thickness was increased. Systolic function was mildly reduced. - Right atrium: The atrium was severely dilated. - Tricuspid valve: There was moderate  regurgitation. - Pulmonary arteries: Systolic pressure was severely increased. PA   peak pressure: 65 mm Hg (S). - Inferior vena cava: The vessel was dilated. The respirophasic   diameter changes were blunted (< 50%), consistent with elevated   central venous pressure. - Pericardium, extracardiac: A trivial pericardial effusion was   identified posterior to the heart.  Patient Profile     83 y.o. female with a history of hypertension and hyperlipidemia who was admitted with atrial fibrillation and rapid ventricular response, along with acute diastolic heart failure.  Echo December 10 showed normal LV function without significant valvular abnormalities.  Assessment & Plan    1.  Persistent atrial fibrillation with rapid ventricular response: She was admitted December 10 following presentation with fairly abrupt onset dyspnea.  She was found to be in A. fib with RVR.  Onset of A. fib unclear.  Beta-blocker dose was increased yesterday but she did not receive the higher dose.  Rates overnight 1 teens to 120s.  Due for 50 mg of metoprolol this morning.  I suspect she will require further titration and/or initiation of another AV nodal blocking agent.  Echo shows normal LV function thus calcium channel blocker and option.  CHA2DS2VASc equals 5.  She is currently on heparin.  She is very hard of hearing and her understanding of management and risks associated with atrial fibrillation is currently poor.  She does live by herself.  We may need to work with case management to arrange a family meeting to discuss further.  Ideally, patient will have her hearing aids in at that time.  2.  Acute diastolic congestive heart failure: Echo shows normal LV function with pulmonary hypertension (65 mmHg), moderate TR, and severely dilated right atrium.  She continues to have crackles on examination with evidence of mild volume overload.  Continue IV Lasix.  Suspect volume is secondary to rapid A. fib though in the  setting of what sounds like a relatively abrupt onset and evidence of right heart failure, would rule out PE.  3.  Hypokalemia: Potassium was 2.8 this morning. Supplementation ordered.    Signed, Murray Hodgkins, NP  02/03/2017, 11:32 AM    For questions or updates, please contact   Please consult [email or web address] for contact info under Cardiology/STEMI.

## 2017-02-03 NOTE — Progress Notes (Signed)
Re-attempted to wean patient down to 1L from 2L of O2. Pt de-sated again to 87%. Put patient back on 2L- sat 94%. Will continue to monitor patient.

## 2017-02-03 NOTE — Progress Notes (Signed)
ANTICOAGULATION CONSULT NOTE - Initial Consult  Pharmacy Consult for heparin Indication: atrial fibrillation  No Known Allergies  Patient Measurements: Height: 5\' 1"  (154.9 cm) Weight: 117 lb (53.1 kg) IBW/kg (Calculated) : 47.8 Heparin Dosing Weight: 53.1 kg  Vital Signs: Temp: 97.1 F (36.2 C) (12/11 0729) Temp Source: Oral (12/11 0729) BP: 131/80 (12/11 0729) Pulse Rate: 125 (12/11 0820)  Labs: Recent Labs    02/02/17 0739 02/02/17 1000 02/02/17 1332 02/02/17 1607 02/02/17 1810 02/03/17 0003 02/03/17 0428 02/03/17 1049  HGB 12.7  --   --   --   --   --  13.3  --   HCT 37.3  --   --   --   --   --  39.5  --   PLT 299  --   --   --   --   --  313  --   APTT  --   --   --  88*  --   --   --   --   LABPROT  --   --   --  15.1  --   --   --   --   INR  --   --   --  1.20  --   --   --   --   HEPARINUNFRC  --   --   --   --   --  0.29*  --  0.37  CREATININE 0.70  --   --   --   --   --  0.83  --   TROPONINI 0.03* 0.03* <0.03  --  0.03*  --   --   --     Estimated Creatinine Clearance: 34 mL/min (by C-G formula based on SCr of 0.83 mg/dL).   Medical History: Past Medical History:  Diagnosis Date  . Hypertension     Medications:  Infusions:  . heparin 900 Units/hr (02/03/17 0302)    Assessment: 49 yof admitted with AF RVR. No PTA OAC noted. Pharmacy consulted to dose heparin for AF.   Goal of Therapy:  Heparin level 0.3-0.7 units/ml Monitor platelets by anticoagulation protocol: Yes   Plan:  HL = 0.37 is therapeutic. Continue heparin infusion at 900 units/hr and order confirmatory HL in 8 hours.  CBC ordered with AM labs tomorrow.  Lenis Noon, PharmD, BCPS 02/03/17 11:29 AM

## 2017-02-03 NOTE — Progress Notes (Signed)
Progress Note  Patient Name: Krystal White Date of Encounter: 02/04/2017  Primary Cardiologist: Kathlyn Sacramento, MD  Subjective   No acute overnight events. Remains in Afib with RVR with heart rates in the 120s to 160s bpm. Net - 3 L for the admission and 740 mL for the past 24 hours. Renal function stable. Potassium repleted.   Inpatient Medications    Scheduled Meds: . furosemide  20 mg Intravenous Daily  . metoprolol succinate  50 mg Oral Daily  . potassium chloride  40 mEq Oral BID  . simvastatin  20 mg Oral q1800   Continuous Infusions: . heparin 900 Units/hr (02/03/17 1503)   PRN Meds: docusate sodium, fluticasone, metoprolol tartrate   Vital Signs    Vitals:   02/03/17 1702 02/03/17 1956 02/04/17 0344 02/04/17 0945  BP: 136/65 138/72 (!) 158/89 137/67  Pulse: 77 94 96 71  Resp: 17 17 17 18   Temp: 97.6 F (36.4 C)  97.8 F (36.6 C)   TempSrc:   Oral   SpO2: 92% 93% 96% 98%  Weight:   107 lb 4.8 oz (48.7 kg)   Height:        Intake/Output Summary (Last 24 hours) at 02/04/2017 1106 Last data filed at 02/04/2017 1022 Gross per 24 hour  Intake 600 ml  Output 1100 ml  Net -500 ml   Filed Weights   02/02/17 0740 02/03/17 1500 02/04/17 0344  Weight: 117 lb (53.1 kg) 107 lb 9.4 oz (48.8 kg) 107 lb 4.8 oz (48.7 kg)    Physical Exam   GEN: Well nourished, well developed, in no acute distress.  HEENT: Grossly normal. HOH. Neck: Supple, no JVD, carotid bruits, or masses. Cardiac: Irregularly irregular, tachycardic, no murmurs, rubs, or gallops. No clubbing, cyanosis, edema.  Radials/DP/PT 2+ and equal bilaterally.  Respiratory:  Respirations regular and unlabored, diminished breath sounds with bibasilar crackles. GI: Soft, nontender, nondistended, BS + x 4. MS: no deformity or atrophy. Skin: warm and dry, no rash. Neuro:  Strength and sensation are intact. Psych: Sleeping.  Normal affect.  Labs    Chemistry Recent Labs  Lab 02/02/17 0739  02/03/17 0428 02/04/17 0509  NA 134* 138 141  K 3.6 2.8* 4.0  CL 97* 91* 101  CO2 27 33* 32  GLUCOSE 114* 112* 119*  BUN 16 15 22*  CREATININE 0.70 0.83 0.73  CALCIUM 8.8* 8.8* 9.3  PROT 6.7  --   --   ALBUMIN 2.9*  --   --   AST 49*  --   --   ALT 54  --   --   ALKPHOS 68  --   --   BILITOT 1.1  --   --   GFRNONAA >60 >60 >60  GFRAA >60 >60 >60  ANIONGAP 10 14 8      Hematology Recent Labs  Lab 02/02/17 0739 02/03/17 0428 02/04/17 0509  WBC 8.8 9.6 9.5  RBC 3.98 4.22 4.22  HGB 12.7 13.3 13.2  HCT 37.3 39.5 40.1  MCV 93.8 93.6 95.0  MCH 32.0 31.4 31.2  MCHC 34.1 33.6 32.9  RDW 13.8 13.7 13.8  PLT 299 313 332    Cardiac Enzymes Recent Labs  Lab 02/02/17 0739 02/02/17 1000 02/02/17 1332 02/02/17 1810  TROPONINI 0.03* 0.03* <0.03 0.03*     Radiology    Ct Angio Chest Pe W Or Wo Contrast  Result Date: 02/03/2017 IMPRESSION: No evidence of pulmonary emboli. 4 mm nodule in the right upper lobe as described.  No follow-up needed if patient is low-risk. Non-contrast chest CT can be considered in 12 months if patient is high-risk. This recommendation follows the consensus statement: Guidelines for Management of Incidental Pulmonary Nodules Detected on CT Images: From the Fleischner Society 2017; Radiology 2017; 284:228-243. Chronic appearing compression deformities in the thoracic spine. Likely left hepatic cyst Aortic Atherosclerosis (ICD10-I70.0) and Emphysema (ICD10-J43.9). Electronically Signed   By: Inez Catalina M.D.   On: 02/03/2017 14:53    Telemetry    Atrial fibrillation, 120s to 160s bpm - Personally Reviewed  Cardiac Studies   2D Echocardiogram 12.10.2018  Study Conclusions   - Left ventricle: The cavity size was normal. Wall thickness was   normal. Systolic function was normal. The estimated ejection   fraction was in the range of 55% to 60%. Wall motion was normal;   there were no regional wall motion abnormalities. - Aortic valve: There was  mild regurgitation. - Mitral valve: There was mild regurgitation. - Left atrium: The atrium was mildly dilated. - Right ventricle: The cavity size was moderately dilated. Wall   thickness was increased. Systolic function was mildly reduced. - Right atrium: The atrium was severely dilated. - Tricuspid valve: There was moderate regurgitation. - Pulmonary arteries: Systolic pressure was severely increased. PA   peak pressure: 65 mm Hg (S). - Inferior vena cava: The vessel was dilated. The respirophasic   diameter changes were blunted (< 50%), consistent with elevated   central venous pressure. - Pericardium, extracardiac: A trivial pericardial effusion was   identified posterior to the heart.  Patient Profile     83 y.o. female with a history of hypertension and hyperlipidemia who was admitted with atrial fibrillation and rapid ventricular response, along with acute diastolic heart failure.    Assessment & Plan    1.  Persistent atrial fibrillation with RVR: -Remains tachycardic into the 160s bpm this morning admitted December 10 with dyspnea.  found to be in A. fib with RVR.  -Onset unclear -Add diltiazem 45 mg q 8 hours -Continue metoprolol, consider changing to Lopressor for added rate control -CHA2DS2VASc equals 5  -On heparin, transition to [place] prior to discharge if felt to be a candidate  -Check tsh  2.  Acute diastolic congestive heart failure/pulmonary hypertension:  -Likely secondary to rapid A. fib  -Echo shows normal LV function with pulmonary hypertension (65 mmHg), moderate TR, and severely dilated right atrium -Evidence of mild volume overload on exam today -Continue IV Lasix with close monitoring of renal function  3. Hypokalemia: -Resolved -Repeat magnesium, if less than 2.0, recommend repletion to > 2.0   Signed, Christell Faith, PA-C  02/04/2017, 11:06 AM    For questions or updates, please contact   Please consult [email or web address] for contact info under  Cardiology/STEMI.  Attending Note Patient seen and examined, agree with detailed note above,  Patient presentation and plan discussed on rounds.   Sleeping this morning, no complaints, very hard of hearing Telemetry reviewed, heart rate 100 up to 160 bpm  On physical exam no JVD, heart sounds irregular, rapid, lungs with rales at the bases, abdomen soft, no significant lower extremity edema  Lab work reviewed showing stable BMP creatinine 0.73, magnesium 1.8, hematocrit 40  A/P: Atrial fibrillation with RVR She is on metoprolol 50, Unable to advance diltiazem given low blood pressure, received 1 dose of 45 mg Digoxin 0.25 mg IV push ordered Improved heart rate but still in the 115 bpm range Will order repeat digoxin dosing in a.m. If  blood pressure improves will restart lower dose diltiazem  Acute on chronic diastolic CHF, pulmonary hypertension Secondary to atrial fibrillation, elevated right heart pressures on echo Continue IV Lasix, renal monitoring   Case discussed with nursing  greater than 50% was spent in counseling and coordination of care with patient Total encounter time 35 minutes or more   Signed: Esmond Plants  M.D., Ph.D. [facility]

## 2017-02-03 NOTE — Progress Notes (Signed)
Attempted to wean patient down from 2L of O2 to 1L, which is acute for the patient. Patient de-sated down to 88-89% on 1L. Put patient back on 2L, sat went back up to 93%. Will re attempt again later, will continue to monitor patient.

## 2017-02-03 NOTE — Plan of Care (Signed)
  Progressing Education: Knowledge of General Education information will improve 02/03/2017 1844 - Progressing by Feliberto Gottron, RN Health Behavior/Discharge Planning: Ability to manage health-related needs will improve 02/03/2017 1844 - Progressing by Feliberto Gottron, RN Clinical Measurements: Will remain free from infection 02/03/2017 1844 - Progressing by Feliberto Gottron, RN Diagnostic test results will improve 02/03/2017 1844 - Progressing by Chriss Czar, Illene Bolus, RN Nutrition: Adequate nutrition will be maintained 02/03/2017 1844 - Progressing by Chriss Czar, Illene Bolus, RN Coping: Level of anxiety will decrease 02/03/2017 1844 - Progressing by Feliberto Gottron, RN Pain Managment: General experience of comfort will improve 02/03/2017 1844 - Progressing by Chriss Czar, Illene Bolus, RN Safety: Ability to remain free from injury will improve 02/03/2017 1844 - Progressing by Chriss Czar, Illene Bolus, RN Skin Integrity: Risk for impaired skin integrity will decrease 02/03/2017 1844 - Progressing by Feliberto Gottron, RN Cardiac: Ability to achieve and maintain adequate cardiopulmonary perfusion will improve 02/03/2017 1844 - Progressing by Feliberto Gottron, RN Health Behavior/Discharge Planning: Ability to safely manage health-related needs after discharge will improve 02/03/2017 1844 - Progressing by Feliberto Gottron, RN

## 2017-02-03 NOTE — Discharge Instructions (Signed)
Heart Failure Clinic appointment on February 13 2017 at 11:20am with Darylene Price, [place]. Please call [phone] to reschedule.

## 2017-02-03 NOTE — Progress Notes (Addendum)
ANTICOAGULATION CONSULT NOTE - Initial Consult  Pharmacy Consult for heparin Indication: atrial fibrillation  No Known Allergies  Patient Measurements: Height: 5\' 1"  (154.9 cm) Weight: 107 lb 9.4 oz (48.8 kg) IBW/kg (Calculated) : 47.8 Heparin Dosing Weight: 53.1 kg  Vital Signs: Temp: 97.6 F (36.4 C) (12/11 1702) BP: 136/65 (12/11 1702) Pulse Rate: 77 (12/11 1702)  Labs: Recent Labs    02/02/17 0739 02/02/17 1000 02/02/17 1332 02/02/17 1607 02/02/17 1810 02/03/17 0003 02/03/17 0428 02/03/17 1049 02/03/17 1821  HGB 12.7  --   --   --   --   --  13.3  --   --   HCT 37.3  --   --   --   --   --  39.5  --   --   PLT 299  --   --   --   --   --  313  --   --   APTT  --   --   --  88*  --   --   --   --   --   LABPROT  --   --   --  15.1  --   --   --   --   --   INR  --   --   --  1.20  --   --   --   --   --   HEPARINUNFRC  --   --   --   --   --  0.29*  --  0.37 0.35  CREATININE 0.70  --   --   --   --   --  0.83  --   --   TROPONINI 0.03* 0.03* <0.03  --  0.03*  --   --   --   --     Estimated Creatinine Clearance: 34 mL/min (by C-G formula based on SCr of 0.83 mg/dL).   Medical History: Past Medical History:  Diagnosis Date  . Hypertension     Medications:  Infusions:  . heparin 900 Units/hr (02/03/17 1503)    Assessment: 38 yof admitted with AF RVR. No PTA OAC noted. Pharmacy consulted to dose heparin for AF.   Goal of Therapy:  Heparin level 0.3-0.7 units/ml Monitor platelets by anticoagulation protocol: Yes   Plan:  HL = 0.37 is therapeutic. Continue heparin infusion at 900 units/hr and order confirmatory HL in 8 hours.   12/11: Heparin level resulted @ 0.35, which is therapeutic. Will recheck HL and CBC with am labs.   James,Teldrin D, PharmD, BCPS 02/03/17 7:52 PM    12/12 AM heparin level 0.19. 1600 unit bolus and increase rate to 1000 units/hr. Recheck in 8 hours.  Sim Boast, PharmD, BCPS  02/04/17 6:51 AM

## 2017-02-03 NOTE — Progress Notes (Addendum)
[facility] [place] NAME: Krystal White    MR#:  [id number]  DATE OF BIRTH:  Jul 23, 1924  SUBJECTIVE:  CHIEF COMPLAINT:   Chief Complaint  Patient presents with  . Shortness of Breath    Came with worsening SOB, noted to have A fib and CHF- feels little better. Still have RVR today. Had good diuresis.  REVIEW OF SYSTEMS:  CONSTITUTIONAL: No fever, fatigue or weakness.  EYES: No blurred or double vision.  EARS, NOSE, AND THROAT: No tinnitus or ear pain.  RESPIRATORY: No cough, shortness of breath, wheezing or hemoptysis.  CARDIOVASCULAR: No chest pain, orthopnea, edema.  GASTROINTESTINAL: No nausea, vomiting, diarrhea or abdominal pain.  GENITOURINARY: No dysuria, hematuria.  ENDOCRINE: No polyuria, nocturia,  HEMATOLOGY: No anemia, easy bruising or bleeding SKIN: No rash or lesion. MUSCULOSKELETAL: No joint pain or arthritis.   NEUROLOGIC: No tingling, numbness, weakness.  PSYCHIATRY: No anxiety or depression.   ROS  DRUG ALLERGIES:  No Known Allergies  VITALS:  Blood pressure (!) 144/74, pulse (!) 112, temperature (!) 97.1 F (36.2 C), temperature source Oral, resp. rate 18, height 5\' 1"  (1.549 m), weight 53.1 kg (117 lb), SpO2 97 %.  PHYSICAL EXAMINATION:   GENERAL:  83 y.o.-year-old patient lying in the bed with no acute distress. Hard o hearing. EYES: Pupils equal, round, reactive to light and accommodation. No scleral icterus. Extraocular muscles intact.  HEENT: Head atraumatic, normocephalic. Oropharynx and nasopharynx clear.  NECK:  Supple, no jugular venous distention. No thyroid enlargement, no tenderness.  LUNGS: Normal breath sounds bilaterally, no wheezing, b/l crepitation. No use of accessory muscles of respiration.  CARDIOVASCULAR: S1, S2 fast. No murmurs, rubs, or gallops.  ABDOMEN: Soft, nontender, nondistended. Bowel sounds present. No organomegaly or mass.  EXTREMITIES: b/l pedal edema,no cyanosis, or  clubbing.  NEUROLOGIC: Cranial nerves II through XII are intact. Muscle strength 5/5 in all extremities. Sensation intact. Gait not checked.  PSYCHIATRIC: The patient is alert and oriented x 3.  SKIN: No obvious rash, lesion, or ulcer.    Physical Exam LABORATORY PANEL:   CBC Recent Labs  Lab 02/03/17 0428  WBC 9.6  HGB 13.3  HCT 39.5  PLT 313   ------------------------------------------------------------------------------------------------------------------  Chemistries  Recent Labs  Lab 02/02/17 0739 02/03/17 0428  NA 134* 138  K 3.6 2.8*  CL 97* 91*  CO2 27 33*  GLUCOSE 114* 112*  BUN 16 15  CREATININE 0.70 0.83  CALCIUM 8.8* 8.8*  MG 1.7  --   AST 49*  --   ALT 54  --   ALKPHOS 68  --   BILITOT 1.1  --    ------------------------------------------------------------------------------------------------------------------  Cardiac Enzymes Recent Labs  Lab 02/02/17 1332 02/02/17 1810  TROPONINI <0.03 0.03*   ------------------------------------------------------------------------------------------------------------------  RADIOLOGY:  Dg Chest Port 1 View  Result Date: 02/02/2017 CLINICAL DATA:  Shortness of breath. EXAM: PORTABLE CHEST 1 VIEW COMPARISON:  06/23/2013 FINDINGS: The heart size and mediastinal contours are within normal limits. There is evidence of moderate congestive heart failure and bilateral pleural effusions, right greater than left. The visualized skeletal structures are unremarkable. IMPRESSION: Moderate CHF with bilateral pleural effusions, right greater than left. Electronically Signed   By: Aletta Edouard M.D.   On: 02/02/2017 08:08    ASSESSMENT AND PLAN:   Principal Problem:   Atrial fibrillation with RVR (HCC) Active Problems:   Acute diastolic CHF (congestive heart failure) (HCC)   * Ac diastolic CHF   IV lasix,  I/O monitor,    Echo reviewed- EF 55%, increased right side pressure.   Cardio consult appreciated.  * A fib  with RVR   IV cardizem given, cont IV metoprolol as needed    Check Trop, monitor tele.     Echo and cardio consult appreciated.   Increased dose metoprolol, she will receive first dose today.  *  Hypertension   Cont metoprolol.   * Elevated D dimer   Due to high right side pressure- checked D dimer, high    Will do CT angio for PE.  * sepsis- suspected in ER.   Ruled out, No need for Abx, stop Abx.  * Hypokalemia   Replace.  All the records are reviewed and case discussed with Care Management/Social Workerr. Management plans discussed with the patient, family and they are in agreement.  CODE STATUS: DNR.  TOTAL TIME TAKING CARE OF THIS PATIENT: 35 minutes.     POSSIBLE D/C IN 1-2 DAYS, DEPENDING ON CLINICAL CONDITION.   [name] M.D on 02/03/2017   Between 7am to 6pm - Pager - [phone]  After 6pm go to [email or web address] - password [email or web address] [facility]  Office  [phone]  CC: Primary care physician; Patient, No Pcp Per  Note: This dictation was prepared with Dragon dictation along with smaller phrase technology. Any transcriptional errors that result from this process are unintentional.

## 2017-02-04 DIAGNOSIS — J810 Acute pulmonary edema: Secondary | ICD-10-CM

## 2017-02-04 LAB — HEPARIN LEVEL (UNFRACTIONATED)
HEPARIN UNFRACTIONATED: 0.47 [IU]/mL (ref 0.30–0.70)
Heparin Unfractionated: 0.19 IU/mL — ABNORMAL LOW (ref 0.30–0.70)
Heparin Unfractionated: 0.65 IU/mL (ref 0.30–0.70)

## 2017-02-04 LAB — CBC
HCT: 40.1 % (ref 35.0–47.0)
HEMOGLOBIN: 13.2 g/dL (ref 12.0–16.0)
MCH: 31.2 pg (ref 26.0–34.0)
MCHC: 32.9 g/dL (ref 32.0–36.0)
MCV: 95 fL (ref 80.0–100.0)
PLATELETS: 332 10*3/uL (ref 150–440)
RBC: 4.22 MIL/uL (ref 3.80–5.20)
RDW: 13.8 % (ref 11.5–14.5)
WBC: 9.5 10*3/uL (ref 3.6–11.0)

## 2017-02-04 LAB — TSH: TSH: 1.47 u[IU]/mL (ref 0.350–4.500)

## 2017-02-04 LAB — BASIC METABOLIC PANEL
Anion gap: 8 (ref 5–15)
BUN: 22 mg/dL — ABNORMAL HIGH (ref 6–20)
CHLORIDE: 101 mmol/L (ref 101–111)
CO2: 32 mmol/L (ref 22–32)
CREATININE: 0.73 mg/dL (ref 0.44–1.00)
Calcium: 9.3 mg/dL (ref 8.9–10.3)
GFR calc non Af Amer: >60 mL/min (ref >60)
GLUCOSE: 119 mg/dL — AB (ref 65–99)
Potassium: 4 mmol/L (ref 3.5–5.1)
Sodium: 141 mmol/L (ref 135–145)

## 2017-02-04 LAB — MAGNESIUM: MAGNESIUM: 1.8 mg/dL (ref 1.7–2.4)

## 2017-02-04 MED ORDER — DIGOXIN 0.25 MG/ML IJ SOLN
0.2500 mg | Freq: Once | INTRAMUSCULAR | Status: AC
  Administered 2017-02-04: 0.25 mg via INTRAVENOUS
  Filled 2017-02-04 (×2): qty 1

## 2017-02-04 MED ORDER — DIGOXIN 0.25 MG/ML IJ SOLN
0.2500 mg | Freq: Once | INTRAMUSCULAR | Status: AC
  Administered 2017-02-05: 0.25 mg via INTRAVENOUS
  Filled 2017-02-04: qty 1

## 2017-02-04 MED ORDER — HEPARIN BOLUS VIA INFUSION
1600.0000 [IU] | Freq: Once | INTRAVENOUS | Status: AC
  Administered 2017-02-04: 1600 [IU] via INTRAVENOUS
  Filled 2017-02-04: qty 1600

## 2017-02-04 NOTE — Care Management Note (Addendum)
Case Management Note  Patient Details  Name: Krystal White MRN: [id number] Date of Birth: March 01, 1924  Subjective/Objective:                 Patient is very hard of hearing.  Has a new hearing aide she uses for her right ear but forgot to bring her batteries. Patient says she is followed by "a doctor in [place]."  CM confirmed it is Dr Baldemar Lenis. She lives alone and uses a cane when need for ambulation.  Able to perform her adls.  Patient denies any issues with transportation, accessing medical care or with transportation.  She is agreeable to have home health nurse if it is indicated for heart failure management. No agency preference   Action/Plan: Referral called to and accepted by [facility]  Expected Discharge Date:  02/04/17               Expected Discharge Plan:     In-House Referral:     Discharge planning Services     Post Acute Care Choice:    Choice offered to:     DME Arranged:    DME Agency:     HH Arranged:    HH Agency:     Status of Service:     If discussed at [employer], dates discussed:    Additional Comments:  Katrina Stack, RN 02/04/2017, 3:11 PM

## 2017-02-04 NOTE — Progress Notes (Addendum)
ANTICOAGULATION CONSULT NOTE - Initial Consult  Pharmacy Consult for heparin Indication: atrial fibrillation  No Known Allergies  Patient Measurements: Height: 5\' 1"  (154.9 cm) Weight: 107 lb 4.8 oz (48.7 kg) IBW/kg (Calculated) : 47.8 Heparin Dosing Weight: 53.1 kg  Vital Signs: Temp: 98.6 F (37 C) (12/12 2028) BP: 137/69 (12/12 2126) Pulse Rate: 97 (12/12 2126)  Labs: Recent Labs    02/02/17 0739 02/02/17 1000 02/02/17 1332 02/02/17 1607 02/02/17 1810  02/03/17 0428  02/04/17 0509 02/04/17 1458 02/04/17 2057  HGB 12.7  --   --   --   --   --  13.3  --  13.2  --   --   HCT 37.3  --   --   --   --   --  39.5  --  40.1  --   --   PLT 299  --   --   --   --   --  313  --  332  --   --   APTT  --   --   --  88*  --   --   --   --   --   --   --   LABPROT  --   --   --  15.1  --   --   --   --   --   --   --   INR  --   --   --  1.20  --   --   --   --   --   --   --   HEPARINUNFRC  --   --   --   --   --    < >  --    < > 0.19* 0.65 0.47  CREATININE 0.70  --   --   --   --   --  0.83  --  0.73  --   --   TROPONINI 0.03* 0.03* <0.03  --  0.03*  --   --   --   --   --   --    < > = values in this interval not displayed.    Estimated Creatinine Clearance: 35.3 mL/min (by C-G formula based on SCr of 0.73 mg/dL).   Medical History: Past Medical History:  Diagnosis Date  . Hypertension     Medications:  Infusions:  . heparin 1,000 Units/hr (02/04/17 1848)    Assessment: 12 yof admitted with AF RVR. No PTA OAC noted. Pharmacy consulted to dose heparin for AF.   Goal of Therapy:  Heparin level 0.3-0.7 units/ml Monitor platelets by anticoagulation protocol: Yes   Plan:  HL = 0.37 is therapeutic. Continue heparin infusion at 900 units/hr and order confirmatory HL in 8 hours.   12/11: Heparin level resulted @ 0.35, which is therapeutic. Will recheck HL and CBC with am labs.   12/12 AM heparin level 0.19. 1600 unit bolus and increase rate to 1000 units/hr.  Recheck in 8 hours.  12/12  1458 HL 0.65. Level is therapeutic. Will reorder level in 6 hours to confirm. CBC with AM labs per protocol   12/12   2100 HL = 0.47 Will continue this pt on current rate and recheck HL on 12/13 with AM labs.   Orene Desanctis, PharmD Clinical Pharmacist 02/04/2017 9:47 PM    12/13 AM heparin level 0.40. Continue current regimen. Recheck heparin level and CBC with tomorrow AM labs.  Sim Boast, PharmD, BCPS  02/05/17 7:12  AM

## 2017-02-04 NOTE — Progress Notes (Signed)
Pt Afib with RVR in the 130's high as 150's. Pt already received metoprolol 5 mg IV at 1215. Pt asymptomatic. Bp 101/66, spo 95 on 2L n/c.Merrily Brittle, PA notified. Orders to give IV dig 0.25 mg. Will continue to monitor.

## 2017-02-04 NOTE — Progress Notes (Addendum)
ANTICOAGULATION CONSULT NOTE - Initial Consult  Pharmacy Consult for heparin Indication: atrial fibrillation  No Known Allergies  Patient Measurements: Height: 5\' 1"  (154.9 cm) Weight: 107 lb 4.8 oz (48.7 kg) IBW/kg (Calculated) : 47.8 Heparin Dosing Weight: 53.1 kg  Vital Signs: Temp: 97.8 F (36.6 C) (12/12 0344) Temp Source: Oral (12/12 0344) BP: 101/66 (12/12 1456) Pulse Rate: 93 (12/12 1456)  Labs: Recent Labs    02/02/17 0739 02/02/17 1000 02/02/17 1332 02/02/17 1607 02/02/17 1810  02/03/17 0428  02/03/17 1821 02/04/17 0509 02/04/17 1458  HGB 12.7  --   --   --   --   --  13.3  --   --  13.2  --   HCT 37.3  --   --   --   --   --  39.5  --   --  40.1  --   PLT 299  --   --   --   --   --  313  --   --  332  --   APTT  --   --   --  88*  --   --   --   --   --   --   --   LABPROT  --   --   --  15.1  --   --   --   --   --   --   --   INR  --   --   --  1.20  --   --   --   --   --   --   --   HEPARINUNFRC  --   --   --   --   --    < >  --    < > 0.35 0.19* 0.65  CREATININE 0.70  --   --   --   --   --  0.83  --   --  0.73  --   TROPONINI 0.03* 0.03* <0.03  --  0.03*  --   --   --   --   --   --    < > = values in this interval not displayed.    Estimated Creatinine Clearance: 35.3 mL/min (by C-G formula based on SCr of 0.73 mg/dL).   Medical History: Past Medical History:  Diagnosis Date  . Hypertension     Medications:  Infusions:  . heparin 900 Units/hr (02/03/17 1503)    Assessment: 68 yof admitted with AF RVR. No PTA OAC noted. Pharmacy consulted to dose heparin for AF.   Goal of Therapy:  Heparin level 0.3-0.7 units/ml Monitor platelets by anticoagulation protocol: Yes   Plan:  HL = 0.37 is therapeutic. Continue heparin infusion at 900 units/hr and order confirmatory HL in 8 hours.   12/11: Heparin level resulted @ 0.35, which is therapeutic. Will recheck HL and CBC with am labs.   12/12 AM heparin level 0.19. 1600 unit bolus and  increase rate to 1000 units/hr. Recheck in 8 hours.  12/12  1458 HL 0.65. Level is therapeutic. Will reorder level in 6 hours to confirm. CBC with AM labs per protocol   Pernell Dupre, PharmD, BCPS Clinical Pharmacist 02/04/2017 3:28 PM

## 2017-02-04 NOTE — Progress Notes (Signed)
Patient admitted with dx of Afib RVR and Acute Diastolic Congestive Heart Failure.  Patient has a history of HTN and HLD. Primary Cardiologist: Dr. Fletcher Anon.   Patient lives alone, is independent with her ADL, and still drives.  Patient is HOH and did not bring batteries for her hearing aides with her to the hospital.    Patient's son, Ronalee Belts, at bedside visiting patient.  Son lives in [place] and is going to be having hip replacement in the next month or so.    Patient stated she presented to the ER with worsening SOB / cough over the past month and swelling in her lower extremities.    CHF Education:  Educational session with patient and son completed.    Patient stated she presented to the ER with worsening SOB and cough over the past month and swellilng in her lower extremities.   ? Provided patient with "Living Better with Heart Failure" packet. Briefly reviewed definition of heart failure and signs and symptoms of an exacerbation. Explained to patient that HF is a chronic illness which must be self-assessed / self-managed with the help of cardiologist / HF Clinic.  ? *Reviewed importance of and reason behind checking weight daily in the AM, after using the bathroom, but before getting dressed. Patient has scales.  ? Reviewed the following information with patient:  *Discussed when to call the Dr= weight gain of >2-3 lb overnight of 5lb in a week,  *Discussed yellow zone= call MD: weight gain of >2-3 lb overnight of 5lb in a week, increased swelling, increased SOB when lying down, chest discomfort, dizziness, increased fatigue *Red Zone= call 911: struggle to breath, fainting or near fainting, significant chest pain  ? *Reviewed low sodium diet-provided handout of recommended and not recommended foods. Patient's son informed this RN that she has never been one to add salt.  Patient does like to eat at [facility].  This RN and son talked to patient about her diet and foods to avoid that are high  in sodium.  Reviewed reading labels with patient and son. Discussed fluid intake with patient as well. Patient not currently on a fluid restriction, but advised no more than 8-8 ounces glass of fluids per day.  ? *Instructed patient to take medications as prescribed for heart failure. Explained briefly why pt is on the medications (either make you feel better, live longer or keep you out of the hospital) and discussed monitoring and side effects.  ? *Discussed exercise. Patient is active in her home.  Patient informed this RN that she is not sedentary.  Encouraged patient to be as active as possible.   ? *Smoking Cessation - Patient is a non-smoker.   ? Role of [facility] discussed. Heart Failure Clinic new patient appointment scheduled for February 13, 2017 at 11:20 a.m. This RN explained to patient where the clinic is located.  [facility] [facility] brochure given to patient and son.    ?

## 2017-02-05 LAB — BASIC METABOLIC PANEL
ANION GAP: 11 (ref 5–15)
BUN: 22 mg/dL — ABNORMAL HIGH (ref 6–20)
CO2: 29 mmol/L (ref 22–32)
Calcium: 10.2 mg/dL (ref 8.9–10.3)
Chloride: 99 mmol/L — ABNORMAL LOW (ref 101–111)
Creatinine, Ser: 0.77 mg/dL (ref 0.44–1.00)
GFR calc Af Amer: >60 mL/min (ref >60)
GLUCOSE: 118 mg/dL — AB (ref 65–99)
POTASSIUM: 4.7 mmol/L (ref 3.5–5.1)
Sodium: 139 mmol/L (ref 135–145)

## 2017-02-05 LAB — CBC
HCT: 44.1 % (ref 35.0–47.0)
Hemoglobin: 14.6 g/dL (ref 12.0–16.0)
MCH: 31.2 pg (ref 26.0–34.0)
MCHC: 33.1 g/dL (ref 32.0–36.0)
MCV: 94.2 fL (ref 80.0–100.0)
PLATELETS: 374 10*3/uL (ref 150–440)
RBC: 4.68 MIL/uL (ref 3.80–5.20)
RDW: 14.2 % (ref 11.5–14.5)
WBC: 10.6 10*3/uL (ref 3.6–11.0)

## 2017-02-05 LAB — HEPARIN LEVEL (UNFRACTIONATED): Heparin Unfractionated: 0.4 IU/mL (ref 0.30–0.70)

## 2017-02-05 MED ORDER — DIGOXIN 0.25 MG/ML IJ SOLN
0.1250 mg | Freq: Every day | INTRAMUSCULAR | Status: DC
  Administered 2017-02-06: 0.125 mg via INTRAVENOUS
  Filled 2017-02-05: qty 0.5

## 2017-02-05 MED ORDER — AMIODARONE HCL 200 MG PO TABS
400.0000 mg | ORAL_TABLET | Freq: Two times a day (BID) | ORAL | Status: DC
  Administered 2017-02-05 – 2017-02-07 (×5): 400 mg via ORAL
  Filled 2017-02-05 (×5): qty 2

## 2017-02-05 MED ORDER — DILTIAZEM HCL 30 MG PO TABS
30.0000 mg | ORAL_TABLET | Freq: Three times a day (TID) | ORAL | Status: DC
  Administered 2017-02-05 – 2017-02-06 (×3): 30 mg via ORAL
  Filled 2017-02-05 (×3): qty 1

## 2017-02-05 NOTE — Progress Notes (Signed)
Progress Note  Patient Name: Krystal White Date of Encounter: 02/05/2017  Primary Cardiologist: Kathlyn Sacramento, MD  Subjective   Remains in Afib with RVR with heart rates in the 100s to 140ss bpm.  Received digoxin 0.25 IV biolus x 2 (one yesterday and one this AM) Hard of hearing, otherwise no complaints Legs feel weak  Inpatient Medications    Scheduled Meds: . diltiazem  30 mg Oral Q8H  . furosemide  20 mg Intravenous Daily  . metoprolol succinate  50 mg Oral Daily  . potassium chloride  40 mEq Oral BID  . simvastatin  20 mg Oral q1800   Continuous Infusions: . heparin 1,000 Units/hr (02/04/17 1900)   PRN Meds: docusate sodium, fluticasone, metoprolol tartrate   Vital Signs    Vitals:   02/04/17 2028 02/04/17 2126 02/05/17 0333 02/05/17 0812  BP: (!) 131/94 137/69 (!) 147/87 (!) 144/77  Pulse: (!) 105 97 (!) 107 (!) 103  Resp: 18  18   Temp: 98.6 F (37 C)  97.9 F (36.6 C) 97.7 F (36.5 C)  TempSrc:   Oral Oral  SpO2: 97%  92% 92%  Weight:   108 lb 14.4 oz (49.4 kg)   Height:        Intake/Output Summary (Last 24 hours) at 02/05/2017 1033 Last data filed at 02/05/2017 0946 Gross per 24 hour  Intake 946 ml  Output 450 ml  Net 496 ml   Filed Weights   02/03/17 1500 02/04/17 0344 02/05/17 0333  Weight: 107 lb 9.4 oz (48.8 kg) 107 lb 4.8 oz (48.7 kg) 108 lb 14.4 oz (49.4 kg)    Physical Exam   GEN: Frail, supine in bed, no complaints HEENT: Grossly normal.  Neck: Supple, no JVD, carotid bruits, or masses. Cardiac: Irregularly irregular, tachycardic, no murmurs, rubs, or gallops. No clubbing, cyanosis, edema.  Radials/DP/PT 2+ and equal bilaterally.  Respiratory:  Respirations regular and unlabored, diminished breath sounds with bibasilar crackles. GI: Soft, thin, nontender, nondistended, BS + x 4. MS: no deformity or atrophy. Skin: warm and dry, no rash. Neuro:  Strength and sensation are intact. Psych: Alert, oriented there is some normal  affect.  Labs    Chemistry Recent Labs  Lab 02/02/17 0739 02/03/17 0428 02/04/17 0509 02/05/17 0616  NA 134* 138 141 139  K 3.6 2.8* 4.0 4.7  CL 97* 91* 101 99*  CO2 27 33* 32 29  GLUCOSE 114* 112* 119* 118*  BUN 16 15 22* 22*  CREATININE 0.70 0.83 0.73 0.77  CALCIUM 8.8* 8.8* 9.3 10.2  PROT 6.7  --   --   --   ALBUMIN 2.9*  --   --   --   AST 49*  --   --   --   ALT 54  --   --   --   ALKPHOS 68  --   --   --   BILITOT 1.1  --   --   --   GFRNONAA >60 >60 >60 >60  GFRAA >60 >60 >60 >60  ANIONGAP 10 14 8 11      Hematology Recent Labs  Lab 02/03/17 0428 02/04/17 0509 02/05/17 0616  WBC 9.6 9.5 10.6  RBC 4.22 4.22 4.68  HGB 13.3 13.2 14.6  HCT 39.5 40.1 44.1  MCV 93.6 95.0 94.2  MCH 31.4 31.2 31.2  MCHC 33.6 32.9 33.1  RDW 13.7 13.8 14.2  PLT 313 332 374    Cardiac Enzymes Recent Labs  Lab 02/02/17 0739  02/02/17 1000 02/02/17 1332 02/02/17 1810  TROPONINI 0.03* 0.03* <0.03 0.03*     Radiology    Ct Angio Chest Pe W Or Wo Contrast  Result Date: 02/03/2017 IMPRESSION: No evidence of pulmonary emboli. 4 mm nodule in the right upper lobe as described. No follow-up needed if patient is low-risk. Non-contrast chest CT can be considered in 12 months if patient is high-risk. This recommendation follows the consensus statement: Guidelines for Management of Incidental Pulmonary Nodules Detected on CT Images: From the Fleischner Society 2017; Radiology 2017; 284:228-243. Chronic appearing compression deformities in the thoracic spine. Likely left hepatic cyst Aortic Atherosclerosis (ICD10-I70.0) and Emphysema (ICD10-J43.9). Electronically Signed   By: Inez Catalina M.D.   On: 02/03/2017 14:53    Telemetry    Atrial fibrillation, 100 up to 140 bpm- Personally Reviewed  Cardiac Studies   2D Echocardiogram 12.10.2018  Study Conclusions   - Left ventricle: The cavity size was normal. Wall thickness was   normal. Systolic function was normal. The estimated  ejection   fraction was in the range of 55% to 60%. Wall motion was normal;   there were no regional wall motion abnormalities. - Aortic valve: There was mild regurgitation. - Mitral valve: There was mild regurgitation. - Left atrium: The atrium was mildly dilated. - Right ventricle: The cavity size was moderately dilated. Wall   thickness was increased. Systolic function was mildly reduced. - Right atrium: The atrium was severely dilated. - Tricuspid valve: There was moderate regurgitation. - Pulmonary arteries: Systolic pressure was severely increased. PA   peak pressure: 65 mm Hg (S). - Inferior vena cava: The vessel was dilated. The respirophasic   diameter changes were blunted (< 50%), consistent with elevated   central venous pressure. - Pericardium, extracardiac: A trivial pericardial effusion was   identified posterior to the heart.  Patient Profile     83 y.o. female with a history of hypertension and hyperlipidemia who was admitted with atrial fibrillation and rapid ventricular response, along with acute diastolic heart failure.    Assessment & Plan    1.  Persistent atrial fibrillation with RVR: -Remains tachycardic into the 140 bpm this morning with exertion admitted December 10 with dyspnea.  found to be in A. fib with RVR.  -Onset unclear -Add diltiazem  30 mg every 8 -Continue metoprolol -CHA2DS2VASc equals 5  -On heparin, transition to [place] prior to discharge if felt to be a candidate  We will add amiodarone as well for rate control Tomorrow digoxin 0.125 mg daily  2.  Acute diastolic congestive heart failure/pulmonary hypertension:  -Likely secondary to rapid A. fib  -Echo shows normal LV function with pulmonary hypertension (65 mmHg), moderate TR, and severely dilated right atrium Renal function stable -Continue IV Lasix 20 mg daily  3.  Weakness Legs very weak, previously independent Unclear if she is candidate at this time for anticoagulation will need to  work with PT,    Total encounter time more than 25 minutes  Greater than 50% was spent in counseling and coordination of care with the patient    Signed, Ida Rogue, MD  02/05/2017, 10:33 AM    For questions or updates, please contact   Please consult [email or web address] for contact info under Cardiology/STEMI.  Attending Note Patient seen and examined, agree with detailed note above,  Patient presentation and plan discussed on rounds.   Sleeping this morning, no complaints, very hard of hearing Telemetry reviewed, heart rate 100 up to 160 bpm  On physical  exam no JVD, heart sounds irregular, rapid, lungs with rales at the bases, abdomen soft, no significant lower extremity edema  Lab work reviewed showing stable BMP creatinine 0.73, magnesium 1.8, hematocrit 40  A/P: Atrial fibrillation with RVR She is on metoprolol 50, Unable to advance diltiazem given low blood pressure, received 1 dose of 45 mg Digoxin 0.25 mg IV push ordered Improved heart rate but still in the 115 bpm range Will order repeat digoxin dosing in a.m. If blood pressure improves will restart lower dose diltiazem  Acute on chronic diastolic CHF, pulmonary hypertension Secondary to atrial fibrillation, elevated right heart pressures on echo Continue IV Lasix, renal monitoring   Case discussed with nursing  greater than 50% was spent in counseling and coordination of care with patient Total encounter time 35 minutes or more   Signed: Esmond Plants  M.D., Ph.D. [facility]

## 2017-02-05 NOTE — Progress Notes (Signed)
[facility] NAME: Krystal White    MR#:  [id number]  DATE OF BIRTH:  1924-09-08  SUBJECTIVE:  CHIEF COMPLAINT:   Chief Complaint  Patient presents with  . Shortness of Breath    Came with worsening SOB, noted to have A fib and CHF- feels little better. Still have RVR today. Had good diuresis. PE ruled out. HR is still faster after increasing metoprolol.  REVIEW OF SYSTEMS:  CONSTITUTIONAL: No fever, fatigue or weakness.  EYES: No blurred or double vision.  EARS, NOSE, AND THROAT: No tinnitus or ear pain.  RESPIRATORY: No cough, shortness of breath, wheezing or hemoptysis.  CARDIOVASCULAR: No chest pain, orthopnea, edema.  GASTROINTESTINAL: No nausea, vomiting, diarrhea or abdominal pain.  GENITOURINARY: No dysuria, hematuria.  ENDOCRINE: No polyuria, nocturia,  HEMATOLOGY: No anemia, easy bruising or bleeding SKIN: No rash or lesion. MUSCULOSKELETAL: No joint pain or arthritis.   NEUROLOGIC: No tingling, numbness, weakness.  PSYCHIATRY: No anxiety or depression.   ROS  DRUG ALLERGIES:  No Known Allergies  VITALS:  Blood pressure (!) 144/77, pulse (!) 103, temperature 97.7 F (36.5 C), temperature source Oral, resp. rate 18, height 5\' 1"  (1.549 m), weight 49.4 kg (108 lb 14.4 oz), SpO2 92 %.  PHYSICAL EXAMINATION:   GENERAL:  83 y.o.-year-old patient lying in the bed with no acute distress. Hard o hearing. EYES: Pupils equal, round, reactive to light and accommodation. No scleral icterus. Extraocular muscles intact.  HEENT: Head atraumatic, normocephalic. Oropharynx and nasopharynx clear.  NECK:  Supple, no jugular venous distention. No thyroid enlargement, no tenderness.  LUNGS: Normal breath sounds bilaterally, no wheezing, b/l crepitation. No use of accessory muscles of respiration.  CARDIOVASCULAR: S1, S2 fast. No murmurs, rubs, or gallops.  ABDOMEN: Soft, nontender, nondistended. Bowel sounds present. No  organomegaly or mass.  EXTREMITIES: b/l pedal edema,no cyanosis, or clubbing.  NEUROLOGIC: Cranial nerves II through XII are intact. Muscle strength 5/5 in all extremities. Sensation intact. Gait not checked.  PSYCHIATRIC: The patient is alert and oriented x 3.  SKIN: No obvious rash, lesion, or ulcer.    Physical Exam LABORATORY PANEL:   CBC Recent Labs  Lab 02/05/17 0616  WBC 10.6  HGB 14.6  HCT 44.1  PLT 374   ------------------------------------------------------------------------------------------------------------------  Chemistries  Recent Labs  Lab 02/02/17 0739  02/04/17 0509 02/04/17 1458  NA 134*   < > 141  --   K 3.6   < > 4.0  --   CL 97*   < > 101  --   CO2 27   < > 32  --   GLUCOSE 114*   < > 119*  --   BUN 16   < > 22*  --   CREATININE 0.70   < > 0.73  --   CALCIUM 8.8*   < > 9.3  --   MG 1.7  --   --  1.8  AST 49*  --   --   --   ALT 54  --   --   --   ALKPHOS 68  --   --   --   BILITOT 1.1  --   --   --    < > = values in this interval not displayed.   ------------------------------------------------------------------------------------------------------------------  Cardiac Enzymes Recent Labs  Lab 02/02/17 1332 02/02/17 1810  TROPONINI <0.03 0.03*   ------------------------------------------------------------------------------------------------------------------  RADIOLOGY:  Ct Angio Chest Pe W Or Wo Contrast  Result  Date: 02/03/2017 CLINICAL DATA:  Shortness of breath and changes of CHF on recent chest x-ray EXAM: CT ANGIOGRAPHY CHEST WITH CONTRAST TECHNIQUE: Multidetector CT imaging of the chest was performed using the standard protocol during bolus administration of intravenous contrast. Multiplanar CT image reconstructions and MIPs were obtained to evaluate the vascular anatomy. CONTRAST:  21mL ISOVUE-370 IOPAMIDOL (ISOVUE-370) INJECTION 76% COMPARISON:  Chest x-rays from 02/02/2017, 06/23/2013 FINDINGS: Cardiovascular: Atherosclerotic  calcifications are noted involving the thoracic aorta and its branches. No aneurysmal dilatation is identified. The pulmonary artery is well visualized and demonstrates a normal branching pattern. No evidence of pulmonary emboli is identified. Mild cardiac enlargement is noted. Coronary calcifications are noted. Mediastinum/Nodes: No significant hilar or mediastinal adenopathy is noted. The thoracic inlet is within normal limits. The esophagus is within normal limits. Lungs/Pleura: Biapical pleural and parenchymal scarring is noted. Diffuse emphysematous changes are seen. A 4 mm nodule is noted in the right upper lobe best seen on image number 38 of series 6. No other sizable nodules are seen. Right pleural effusion and mild right basilar infiltrate is seen. Upper Abdomen: Hypodensity is noted within the left lobe of the liver consistent with a cyst. It measures approximately 2.2 cm in greatest dimension. Musculoskeletal: Degenerative change of the thoracic spine is noted. Chronic compression deformity of T2 is noted. Mild endplate sclerosis is noted superiorly at T3 and T4 which may also represent some chronic compression deformity. Review of the MIP images confirms the above findings. IMPRESSION: No evidence of pulmonary emboli. 4 mm nodule in the right upper lobe as described. No follow-up needed if patient is low-risk. Non-contrast chest CT can be considered in 12 months if patient is high-risk. This recommendation follows the consensus statement: Guidelines for Management of Incidental Pulmonary Nodules Detected on CT Images: From the Fleischner Society 2017; Radiology 2017; 284:228-243. Chronic appearing compression deformities in the thoracic spine. Likely left hepatic cyst Aortic Atherosclerosis (ICD10-I70.0) and Emphysema (ICD10-J43.9). Electronically Signed   By: Inez Catalina M.D.   On: 02/03/2017 14:53    ASSESSMENT AND PLAN:   Principal Problem:   Atrial fibrillation with RVR (HCC) Active  Problems:   Acute diastolic CHF (congestive heart failure) (HCC)   * Ac diastolic CHF   IV lasix, I/O monitor, monitor renal func.    Echo reviewed- EF 55%, increased right side pressure.   Cardio consult appreciated.  * A fib with RVR   IV cardizem given, cont IV metoprolol as needed    Check Trop, monitor tele.     Echo and cardio consult appreciated.   Increased dose metoprolol, still have tachy- cardio suggesting digoxin.  *  Hypertension   Cont metoprolol.   * Elevated D dimer   Due to high right side pressure- checked D dimer, high   CT angio negative for PE.  * sepsis- suspected in ER.   Ruled out, No need for Abx, stop Abx.  * Hypokalemia   Replace.  All the records are reviewed and case discussed with Care Management/Social Workerr. Management plans discussed with the patient, family and they are in agreement.  CODE STATUS: DNR.  TOTAL TIME TAKING CARE OF THIS PATIENT: 35 minutes.     POSSIBLE D/C IN 1-2 DAYS, DEPENDING ON CLINICAL CONDITION.   [name] M.D on 02/05/2017   Between 7am to 6pm - Pager - [phone]  After 6pm go to [email or web address] - password [email or web address] [facility]  Office  [phone]  CC: Primary care physician; Derinda Late, MD  Note: This dictation was prepared with Dragon dictation along with smaller phrase technology. Any transcriptional errors that result from this process are unintentional.

## 2017-02-05 NOTE — Plan of Care (Signed)
Patient was hemodynamically stable overnight.In asymptomatic afib, VSS, denied pain, heparin gtt infusing. Patient was weaned off supplementary oxygen, O2sat above 90%

## 2017-02-06 LAB — CBC
HCT: 42.8 % (ref 35.0–47.0)
Hemoglobin: 14.2 g/dL (ref 12.0–16.0)
MCH: 31.6 pg (ref 26.0–34.0)
MCHC: 33.2 g/dL (ref 32.0–36.0)
MCV: 95.1 fL (ref 80.0–100.0)
PLATELETS: 342 10*3/uL (ref 150–440)
RBC: 4.5 MIL/uL (ref 3.80–5.20)
RDW: 14.1 % (ref 11.5–14.5)
WBC: 8.6 10*3/uL (ref 3.6–11.0)

## 2017-02-06 LAB — BASIC METABOLIC PANEL
Anion gap: 8 (ref 5–15)
BUN: 27 mg/dL — AB (ref 6–20)
CO2: 30 mmol/L (ref 22–32)
Calcium: 9.9 mg/dL (ref 8.9–10.3)
Chloride: 98 mmol/L — ABNORMAL LOW (ref 101–111)
Creatinine, Ser: 0.93 mg/dL (ref 0.44–1.00)
GFR calc Af Amer: >60 mL/min (ref >60)
GFR, EST NON AFRICAN AMERICAN: 53 mL/min — AB (ref >60)
Glucose, Bld: 106 mg/dL — ABNORMAL HIGH (ref 65–99)
POTASSIUM: 5.1 mmol/L (ref 3.5–5.1)
SODIUM: 136 mmol/L (ref 135–145)

## 2017-02-06 LAB — HEPARIN LEVEL (UNFRACTIONATED): Heparin Unfractionated: 0.58 IU/mL (ref 0.30–0.70)

## 2017-02-06 MED ORDER — SODIUM CHLORIDE 0.9% FLUSH
3.0000 mL | Freq: Two times a day (BID) | INTRAVENOUS | Status: DC
  Administered 2017-02-06: 3 mL via INTRAVENOUS

## 2017-02-06 MED ORDER — FUROSEMIDE 20 MG PO TABS
20.0000 mg | ORAL_TABLET | Freq: Every day | ORAL | Status: DC
  Administered 2017-02-07: 20 mg via ORAL
  Filled 2017-02-06: qty 1

## 2017-02-06 MED ORDER — APIXABAN 2.5 MG PO TABS
2.5000 mg | ORAL_TABLET | Freq: Two times a day (BID) | ORAL | Status: DC
  Administered 2017-02-06 – 2017-02-07 (×3): 2.5 mg via ORAL
  Filled 2017-02-06 (×3): qty 1

## 2017-02-06 MED ORDER — METOPROLOL SUCCINATE ER 100 MG PO TB24
100.0000 mg | ORAL_TABLET | Freq: Every day | ORAL | Status: DC
Start: 2017-02-07 — End: 2017-02-07
  Administered 2017-02-07: 100 mg via ORAL
  Filled 2017-02-06: qty 1

## 2017-02-06 NOTE — Evaluation (Signed)
Physical Therapy Evaluation Patient Details Name: Krystal White MRN: [id number] DOB: 1925/02/22 Today's Date: 02/06/2017   History of Present Illness  Pt is a 83 y/o F who presented with weakness, SOB.  Pt noted to be in a-fib with RVR with HR ranging 150-160.  Chest x-ray consistent with heart failure.     Clinical Impression  Pt admitted with above diagnosis. Pt currently with functional limitations due to the deficits listed below (see PT Problem List). Ms. Howser is from home where she lives alone and is independent at baseline ambulating with a SPC.  Today trial of RW use as pt unsteady with use of SPC.  Pt requires up to min assist for proper positioning of RW to avoid bumping into obstacles and correcting when she does. Suggesting HHPT to assist pt with improved balance and safety when ambulating and using AD.  HR up to 122 with ambulation. RN and MD notified.  Pt will benefit from skilled PT to increase their independence and safety with mobility to allow discharge to the venue listed below.      Follow Up Recommendations Home health PT;Supervision/Assistance - 24 hour    Equipment Recommendations  Rolling walker with 5" wheels    Recommendations for Other Services       Precautions / Restrictions Precautions Precautions: Fall;Other (comment) Precaution Comments: Monitor HR Restrictions Weight Bearing Restrictions: No      Mobility  Bed Mobility Overal bed mobility: Independent             General bed mobility comments: No physical assist or cues needed.  Pt performs independently.   Transfers Overall transfer level: Needs assistance Equipment used: 1 person hand held assist Transfers: Sit to/from Stand Sit to Stand: Min assist         General transfer comment: 1 person HHA to simulate use of SPC.  Pt stand slowly with mild instability but no LOB.  Pt requires cues for proper RW management when preparing to sit.   Ambulation/Gait Ambulation/Gait  assistance: Min assist Ambulation Distance (Feet): 80 Feet Assistive device: Rolling walker (2 wheeled);Straight cane Gait Pattern/deviations: Step-through pattern;Decreased stride length;Trunk flexed   Gait velocity interpretation: at or above normal speed for age/gender General Gait Details: Ambulates initial 15 ft with SPC before transitioning to RW per suggestion of this PT as pt demonstrates instability with SPC.  Flexed posture and pt pushing RW too far ahead and bumping into obstacles, requiring assist at times for positioning of RW.  HR up to 122 while ambulating.    Stairs            Wheelchair Mobility    Modified Rankin (Stroke Patients Only)       Balance Overall balance assessment: Needs assistance Sitting-balance support: No upper extremity supported;Feet supported Sitting balance-Leahy Scale: Good     Standing balance support: No upper extremity supported;During functional activity Standing balance-Leahy Scale: Fair Standing balance comment: Pt able to stand statically without UE support but relies on UE support for dynamic activities                             Pertinent Vitals/Pain Pain Assessment: No/denies pain    Home Living Family/patient expects to be discharged to:: Private residence Living Arrangements: Alone Available Help at Discharge: Family;Available 24 hours/day(for first 24 hrs) Type of Home: Mobile home Home Access: Ramped entrance     Home Layout: One level Home Equipment: Walker - standard;Cane -  single point;Bedside commode;Grab bars - tub/shower;Grab bars - toilet      Prior Function Level of Independence: Independent with assistive device(s)         Comments: Pt ambulating with SPC at baseline with no falls in the past 6 months.  Pt independent with all ADLs, still driving.      Hand Dominance        Extremity/Trunk Assessment   Upper Extremity Assessment Upper Extremity Assessment: Generalized weakness     Lower Extremity Assessment Lower Extremity Assessment: Generalized weakness    Cervical / Trunk Assessment Cervical / Trunk Assessment: Kyphotic  Communication   Communication: HOH;Other (comment)(has hearing aid R ear but still profoundly HOH)  Cognition Arousal/Alertness: Awake/alert Behavior During Therapy: WFL for tasks assessed/performed Overall Cognitive Status: Within Functional Limits for tasks assessed                                 General Comments: Although with slower processing at times, cues provided and increased time to correct RW positioning as pt ambulating.       General Comments General comments (skin integrity, edema, etc.): Son in room during Evaluation    Exercises     Assessment/Plan    PT Assessment Patient needs continued PT services  PT Problem List Decreased strength;Decreased balance;Decreased knowledge of use of DME;Decreased safety awareness       PT Treatment Interventions DME instruction;Gait training;Functional mobility training;Therapeutic activities;Therapeutic exercise;Balance training;Neuromuscular re-education;Patient/family education;Cognitive remediation    PT Goals (Current goals can be found in the Care Plan section)  Acute Rehab PT Goals Patient Stated Goal: to continue walking as much as she possibly can and to continue with her active lifestyle PT Goal Formulation: With patient Time For Goal Achievement: 02/20/17 Potential to Achieve Goals: Good    Frequency Min 2X/week   Barriers to discharge        Co-evaluation               AM-PAC PT "6 Clicks" Daily Activity  Outcome Measure Difficulty turning over in bed (including adjusting bedclothes, sheets and blankets)?: None Difficulty moving from lying on back to sitting on the side of the bed? : None Difficulty sitting down on and standing up from a chair with arms (e.g., wheelchair, bedside commode, etc,.)?: Unable Help needed moving to and from a  bed to chair (including a wheelchair)?: A Little Help needed walking in hospital room?: A Little Help needed climbing 3-5 steps with a railing? : A Lot 6 Click Score: 17    End of Session Equipment Utilized During Treatment: Gait belt Activity Tolerance: Patient tolerated treatment well Patient left: in chair;with call bell/phone within reach;with chair alarm set;with family/visitor present Nurse Communication: Mobility status;Other (comment)(HR) PT Visit Diagnosis: Muscle weakness (generalized) (M62.81);Unsteadiness on feet (R26.81);Other abnormalities of gait and mobility (R26.89)    Time: 0272-5366 PT Time Calculation (min) (ACUTE ONLY): 24 min   Charges:   PT Evaluation $PT Eval Low Complexity: 1 Low PT Treatments $Gait Training: 8-22 mins   PT G Codes:   PT G-Codes **NOT FOR INPATIENT CLASS** Functional Assessment Tool Used: AM-PAC 6 Clicks Basic Mobility;Clinical judgement Functional Limitation: Mobility: Walking and moving around Mobility: Walking and Moving Around Current Status (Y4034): At least 40 percent but less than 60 percent impaired, limited or restricted Mobility: Walking and Moving Around Goal Status [phone]): At least 20 percent but less than 40 percent impaired, limited or restricted  Collie Siad PT, DPT 02/06/2017, 3:51 PM

## 2017-02-06 NOTE — Progress Notes (Signed)
Progress Note  Patient Name: Krystal White Date of Encounter: 02/06/2017  Primary Cardiologist: Kathlyn Sacramento, MD  Subjective   No c/p or sob.  Eager to go home.  Inpatient Medications    Scheduled Meds: . amiodarone  400 mg Oral BID  . furosemide  20 mg Intravenous Daily  . metoprolol succinate  50 mg Oral Daily  . simvastatin  20 mg Oral q1800   Continuous Infusions: . heparin 1,000 Units/hr (02/06/17 0018)   PRN Meds: docusate sodium, fluticasone, metoprolol tartrate   Vital Signs    Vitals:   02/05/17 1711 02/05/17 2006 02/06/17 0349 02/06/17 0810  BP: 119/63 121/60 (!) 132/55 104/84  Pulse: 79 81 60 66  Resp:  18 16 19   Temp:  98.3 F (36.8 C) 98.5 F (36.9 C) 98.5 F (36.9 C)  TempSrc:  Oral Oral Oral  SpO2: 96% 97% 97% 94%  Weight:  109 lb 9.6 oz (49.7 kg) 109 lb 9.6 oz (49.7 kg)   Height:        Intake/Output Summary (Last 24 hours) at 02/06/2017 1308 Last data filed at 02/06/2017 0959 Gross per 24 hour  Intake 1080 ml  Output 400 ml  Net 680 ml   Filed Weights   02/05/17 0333 02/05/17 2006 02/06/17 0349  Weight: 108 lb 14.4 oz (49.4 kg) 109 lb 9.6 oz (49.7 kg) 109 lb 9.6 oz (49.7 kg)    Physical Exam   GEN: Well nourished, well developed, in no acute distress.  HEENT: Grossly normal.  Neck: Supple, no JVD, carotid bruits, or masses. Cardiac: IR, IR, no murmurs, rubs, or gallops. No clubbing, cyanosis, edema.  Radials/DP/PT 2+ and equal bilaterally.  Respiratory:  Respirations regular and unlabored, clear to auscultation bilaterally. GI: Soft, nontender, nondistended, BS + x 4. MS: no deformity or atrophy. Skin: warm and dry, no rash. Neuro:  Strength and sensation are intact. Psych: AAOx3.  Normal affect.  Labs    Chemistry Recent Labs  Lab 02/02/17 [phone]  02/04/17 0509 02/05/17 0616 02/06/17 0409  NA 134*   < > 141 139 136  K 3.6   < > 4.0 4.7 5.1  CL 97*   < > 101 99* 98*  CO2 27   < > 32 29 30  GLUCOSE 114*   < >  119* 118* 106*  BUN 16   < > 22* 22* 27*  CREATININE 0.70   < > 0.73 0.77 0.93  CALCIUM 8.8*   < > 9.3 10.2 9.9  PROT 6.7  --   --   --   --   ALBUMIN 2.9*  --   --   --   --   AST 49*  --   --   --   --   ALT 54  --   --   --   --   ALKPHOS 68  --   --   --   --   BILITOT 1.1  --   --   --   --   GFRNONAA >60   < > >60 >60 53*  GFRAA >60   < > >60 >60 >60  ANIONGAP 10   < > 8 11 8    < > = values in this interval not displayed.     Hematology Recent Labs  Lab 02/04/17 0509 02/05/17 0616 02/06/17 0409  WBC 9.5 10.6 8.6  RBC 4.22 4.68 4.50  HGB 13.2 14.6 14.2  HCT 40.1 44.1 42.8  MCV 95.0  94.2 95.1  MCH 31.2 31.2 31.6  MCHC 32.9 33.1 33.2  RDW 13.8 14.2 14.1  PLT 332 374 342    Cardiac Enzymes Recent Labs  Lab 02/02/17 0739 02/02/17 1000 02/02/17 1332 02/02/17 1810  TROPONINI 0.03* 0.03* <0.03 0.03*   Radiology    No results found.  Telemetry    Afib 70's to 80's - Personally Reviewed  Cardiac Studies   2D Echocardiogram 12.10.2018   Study Conclusions   - Left ventricle: The cavity size was normal. Wall thickness was   normal. Systolic function was normal. The estimated ejection   fraction was in the range of 55% to 60%. Wall motion was normal;   there were no regional wall motion abnormalities. - Aortic valve: There was mild regurgitation. - Mitral valve: There was mild regurgitation. - Left atrium: The atrium was mildly dilated. - Right ventricle: The cavity size was moderately dilated. Wall   thickness was increased. Systolic function was mildly reduced. - Right atrium: The atrium was severely dilated. - Tricuspid valve: There was moderate regurgitation. - Pulmonary arteries: Systolic pressure was severely increased. PA   peak pressure: 65 mm Hg (S). - Inferior vena cava: The vessel was dilated. The respirophasic   diameter changes were blunted (< 50%), consistent with elevated   central venous pressure. - Pericardium, extracardiac: A trivial  pericardial effusion was   identified posterior to the heart.  Patient Profile     83 y.o. female with a history of hypertension and hyperlipidemia who was admitted with atrial fibrillation and rapid ventricular response, along with acute diastolic heart failure.  Echo December 10 showed normal LV function without significant valvular abnormalities.  Assessment & Plan    1.  Persistent Afib:  Now rate controlled after initiation of amio yesterday.  Will d/c digoxin and dilt.  Cont  blocker and amio - 400 bid x 1 wk  200 bid x 1 wk  200 daily.  CHA2DS2VASc = 5.  Will add eliquis 2.5 bid (>82 y/o. < 60kg).  She lives by herself but son lives nearby.  Rec discussion re: [place] with son prior to d/c - he is currently @ a doctor's appt and cannot field a call.  2.  Acute diastolic CHF:  Echo w/ nl EF, PAH (50mmHg), mod TR, and sev dil RA.  Euvolemic.  Change to lasix 20 mg PO daily.  Renal fxn stable.  Signed, Murray Hodgkins, NP  02/06/2017, 1:08 PM    For questions or updates, please contact   Please consult [email or web address] for contact info under Cardiology/STEMI.

## 2017-02-06 NOTE — Care Management Important Message (Signed)
Important Message  Patient Details  Name: Krystal White MRN: [id number] Date of Birth: 03/20/1924   Medicare Important Message Given:  Yes Signed IM notice given    Katrina Stack, RN 02/06/2017, 3:02 PM

## 2017-02-06 NOTE — Progress Notes (Signed)
[facility] at [facility] NAME: Krystal White    MR#:  [id number]  DATE OF BIRTH:  [birth year]  SUBJECTIVE:  CHIEF COMPLAINT:   Chief Complaint  Patient presents with  . Shortness of Breath    Came with worsening SOB, noted to have A fib and CHF- feels little better. Still have RVR today. Had good diuresis. PE ruled out. HR is still faster after increasing metoprolol.  REVIEW OF SYSTEMS:  CONSTITUTIONAL: No fever, fatigue or weakness.  EYES: No blurred or double vision.  EARS, NOSE, AND THROAT: No tinnitus or ear pain.  RESPIRATORY: No cough, shortness of breath, wheezing or hemoptysis.  CARDIOVASCULAR: No chest pain, orthopnea, edema.  GASTROINTESTINAL: No nausea, vomiting, diarrhea or abdominal pain.  GENITOURINARY: No dysuria, hematuria.  ENDOCRINE: No polyuria, nocturia,  HEMATOLOGY: No anemia, easy bruising or bleeding SKIN: No rash or lesion. MUSCULOSKELETAL: No joint pain or arthritis.   NEUROLOGIC: No tingling, numbness, weakness.  PSYCHIATRY: No anxiety or depression.   ROS  DRUG ALLERGIES:  No Known Allergies  VITALS:  Blood pressure 104/84, pulse 66, temperature 98.5 F (36.9 C), temperature source Oral, resp. rate 19, height 5\' 1"  (1.549 m), weight 49.7 kg (109 lb 9.6 oz), SpO2 94 %.  PHYSICAL EXAMINATION:   GENERAL:  83 y.o.-year-old patient lying in the bed with no acute distress. Hard o hearing. EYES: Pupils equal, round, reactive to light and accommodation. No scleral icterus. Extraocular muscles intact.  HEENT: Head atraumatic, normocephalic. Oropharynx and nasopharynx clear. Hard of hearing. NECK:  Supple, no jugular venous distention. No thyroid enlargement, no tenderness.  LUNGS: Normal breath sounds bilaterally, no wheezing, b/l crepitation. No use of accessory muscles of respiration.  CARDIOVASCULAR: S1, S2 fast. No murmurs, rubs, or gallops.  ABDOMEN: Soft, nontender, nondistended. Bowel sounds present. No  organomegaly or mass.  EXTREMITIES: b/l pedal edema,no cyanosis, or clubbing.  NEUROLOGIC: Cranial nerves II through XII are intact. Muscle strength 4-5/5 in all extremities. Sensation intact. Gait not checked.  PSYCHIATRIC: The patient is alert and oriented x 3.  SKIN: No obvious rash, lesion, or ulcer.    Physical Exam LABORATORY PANEL:   CBC Recent Labs  Lab 02/06/17 0409  WBC 8.6  HGB 14.2  HCT 42.8  PLT 342   ------------------------------------------------------------------------------------------------------------------  Chemistries  Recent Labs  Lab 02/02/17 0739  02/04/17 1458  02/06/17 0409  NA 134*   < >  --    < > 136  K 3.6   < >  --    < > 5.1  CL 97*   < >  --    < > 98*  CO2 27   < >  --    < > 30  GLUCOSE 114*   < >  --    < > 106*  BUN 16   < >  --    < > 27*  CREATININE 0.70   < >  --    < > 0.93  CALCIUM 8.8*   < >  --    < > 9.9  MG 1.7  --  1.8  --   --   AST 49*  --   --   --   --   ALT 54  --   --   --   --   ALKPHOS 68  --   --   --   --   BILITOT 1.1  --   --   --   --    < > =  values in this interval not displayed.   ------------------------------------------------------------------------------------------------------------------  Cardiac Enzymes Recent Labs  Lab 02/02/17 1332 02/02/17 1810  TROPONINI <0.03 0.03*   ------------------------------------------------------------------------------------------------------------------  RADIOLOGY:  No results found.  ASSESSMENT AND PLAN:   Principal Problem:   Atrial fibrillation with RVR (HCC) Active Problems:   Acute diastolic CHF (congestive heart failure) (HCC)   * Ac diastolic CHF   IV lasix, I/O monitor, monitor renal func.   Echo reviewed- EF 55%, increased right side pressure.   Cardio consult appreciated.  * A fib with RVR   IV cardizem given, cont IV metoprolol as needed    Check Trop, monitor tele.     Echo and cardio consult appreciated.   Increased dose  metoprolol, still have tachy- cardio suggesting digoxin.   Also starting on amiodarone and low dose cardizem, monitor tonight.   On heparine drip, may switch to oral on d/c  *  Hypertension   Cont metoprolol.   * Elevated D dimer   Due to high right side pressure- checked D dimer, high   CT angio negative for PE.  * sepsis- suspected in ER.   Ruled out, No need for Abx, stop Abx.  * Hypokalemia   Replace.  All the records are reviewed and case discussed with Care Management/Social Workerr. Management plans discussed with the patient, family and they are in agreement.  CODE STATUS: DNR.  TOTAL TIME TAKING CARE OF THIS PATIENT: 35 minutes.   POSSIBLE D/C IN 1-2 DAYS, DEPENDING ON CLINICAL CONDITION.   [name] M.D on 02/06/2017   Between 7am to 6pm - Pager - [phone]  After 6pm go to [email or web address] - password [email or web address] [facility]  Office  [phone]  CC: Primary care physician; Derinda Late, MD  Note: This dictation was prepared with Dragon dictation along with smaller phrase technology. Any transcriptional errors that result from this process are unintentional.

## 2017-02-06 NOTE — Care Management (Signed)
Requiring additional digoxin to control heart rate.  Informed during progression that patient most likely will discharge home on eliquis..  PT consult is pending. If recommended, will add physical therapy to home health referral

## 2017-02-06 NOTE — Progress Notes (Signed)
[place] at [place] NAME: Krystal White    MR#:  [id number]  DATE OF BIRTH:  01/10/1925  SUBJECTIVE:  CHIEF COMPLAINT:   Chief Complaint  Patient presents with  . Shortness of Breath    Came with worsening SOB, noted to have A fib and CHF- feels little better. Still have RVR today. Had good diuresis. PE ruled out. HRcame under control after IV digoxin and adding oral amiodarone. Today on walkign her HR rise up to 120.  REVIEW OF SYSTEMS:  CONSTITUTIONAL: No fever, fatigue or weakness.  EYES: No blurred or double vision.  EARS, NOSE, AND THROAT: No tinnitus or ear pain.  RESPIRATORY: No cough, shortness of breath, wheezing or hemoptysis.  CARDIOVASCULAR: No chest pain, orthopnea, edema.  GASTROINTESTINAL: No nausea, vomiting, diarrhea or abdominal pain.  GENITOURINARY: No dysuria, hematuria.  ENDOCRINE: No polyuria, nocturia,  HEMATOLOGY: No anemia, easy bruising or bleeding SKIN: No rash or lesion. MUSCULOSKELETAL: No joint pain or arthritis.   NEUROLOGIC: No tingling, numbness, weakness.  PSYCHIATRY: No anxiety or depression.   ROS  DRUG ALLERGIES:  No Known Allergies  VITALS:  Blood pressure 104/84, pulse 66, temperature 98.5 F (36.9 C), temperature source Oral, resp. rate 19, height 5\' 1"  (1.549 m), weight 49.7 kg (109 lb 9.6 oz), SpO2 94 %.  PHYSICAL EXAMINATION:   GENERAL:  84 y.o.-year-old patient lying in the bed with no acute distress. Hard o hearing. EYES: Pupils equal, round, reactive to light and accommodation. No scleral icterus. Extraocular muscles intact.  HEENT: Head atraumatic, normocephalic. Oropharynx and nasopharynx clear. Hard of hearing. NECK:  Supple, no jugular venous distention. No thyroid enlargement, no tenderness.  LUNGS: Normal breath sounds bilaterally, no wheezing, b/l crepitation. No use of accessory muscles of respiration.  CARDIOVASCULAR: S1, S2 regular. No murmurs, rubs, or gallops.  ABDOMEN:  Soft, nontender, nondistended. Bowel sounds present. No organomegaly or mass.  EXTREMITIES: b/l pedal edema,no cyanosis, or clubbing.  NEUROLOGIC: Cranial nerves II through XII are intact. Muscle strength 4-5/5 in all extremities. Sensation intact. Gait not checked.  PSYCHIATRIC: The patient is alert and oriented x 3.  SKIN: No obvious rash, lesion, or ulcer.   Physical Exam LABORATORY PANEL:   CBC Recent Labs  Lab 02/06/17 0409  WBC 8.6  HGB 14.2  HCT 42.8  PLT 342   ------------------------------------------------------------------------------------------------------------------  Chemistries  Recent Labs  Lab 02/02/17 0739  02/04/17 1458  02/06/17 0409  NA 134*   < >  --    < > 136  K 3.6   < >  --    < > 5.1  CL 97*   < >  --    < > 98*  CO2 27   < >  --    < > 30  GLUCOSE 114*   < >  --    < > 106*  BUN 16   < >  --    < > 27*  CREATININE 0.70   < >  --    < > 0.93  CALCIUM 8.8*   < >  --    < > 9.9  MG 1.7  --  1.8  --   --   AST 49*  --   --   --   --   ALT 54  --   --   --   --   ALKPHOS 68  --   --   --   --  BILITOT 1.1  --   --   --   --    < > = values in this interval not displayed.   ------------------------------------------------------------------------------------------------------------------  Cardiac Enzymes Recent Labs  Lab 02/02/17 1332 02/02/17 1810  TROPONINI <0.03 0.03*   ------------------------------------------------------------------------------------------------------------------  RADIOLOGY:  No results found.  ASSESSMENT AND PLAN:   Principal Problem:   Atrial fibrillation with RVR (HCC) Active Problems:   Acute diastolic CHF (congestive heart failure) (HCC)   * Ac diastolic CHF   IV lasix, I/O monitor, monitor renal func.   Echo reviewed- EF 55%, increased right side pressure.   Cardio consult appreciated.  * A fib with RVR   IV cardizem given, cont IV metoprolol as needed    Check Trop, monitor tele.     Echo and  cardio consult appreciated.   Increased dose metoprolol, still have tachy- cardio suggesting digoxin.   Also starting on amiodarone and low dose cardizem,    On heparine drip, switched to low dose eliquis.   Cardio suggested metoprolol, amio on discharge.  *  Hypertension   Cont metoprolol.   * Elevated D dimer   Due to high right side pressure- checked D dimer, high   CT angio negative for PE.  * sepsis- suspected in ER.   Ruled out, No need for Abx, stop Abx.  * Hypokalemia   Replace.  * generalized weakness   PT suggest HHA, rolling walker.    Her HR went up to 120 on walking, she lives alone- monitor tonight. Called son.    D/c tomorrow.  All the records are reviewed and case discussed with Care Management/Social Workerr. Management plans discussed with the patient, family and they are in agreement.  CODE STATUS: DNR.  TOTAL TIME TAKING CARE OF THIS PATIENT: 35 minutes.   POSSIBLE D/C IN 1-2 DAYS, DEPENDING ON CLINICAL CONDITION.   [name] M.D on 02/06/2017   Between 7am to 6pm - Pager - [phone]  After 6pm go to [email or web address] - password [email or web address] [facility]  Office  [phone]  CC: Primary care physician; Derinda Late, MD  Note: This dictation was prepared with Dragon dictation along with smaller phrase technology. Any transcriptional errors that result from this process are unintentional.

## 2017-02-06 NOTE — Progress Notes (Signed)
ANTICOAGULATION CONSULT NOTE - Initial Consult  Pharmacy Consult for heparin Indication: atrial fibrillation  No Known Allergies  Patient Measurements: Height: 5\' 1"  (154.9 cm) Weight: 109 lb 9.6 oz (49.7 kg) IBW/kg (Calculated) : 47.8 Heparin Dosing Weight: 53.1 kg  Vital Signs: Temp: 98.5 F (36.9 C) (12/14 0349) Temp Source: Oral (12/14 0349) BP: 132/55 (12/14 0349) Pulse Rate: 60 (12/14 0349)  Labs: Recent Labs    02/04/17 0509  02/04/17 2057 02/05/17 0616 02/06/17 0409  HGB 13.2  --   --  14.6 14.2  HCT 40.1  --   --  44.1 42.8  PLT 332  --   --  374 342  HEPARINUNFRC 0.19*   < > 0.47 0.40 0.58  CREATININE 0.73  --   --  0.77 0.93   < > = values in this interval not displayed.    Estimated Creatinine Clearance: 30.3 mL/min (by C-G formula based on SCr of 0.93 mg/dL).   Medical History: Past Medical History:  Diagnosis Date  . Hypertension     Medications:  Infusions:  . heparin 1,000 Units/hr (02/06/17 0018)    Assessment: 68 yof admitted with AF RVR. No PTA OAC noted. Pharmacy consulted to dose heparin for AF.   Goal of Therapy:  Heparin level 0.3-0.7 units/ml Monitor platelets by anticoagulation protocol: Yes   Plan:  HL = 0.37 is therapeutic. Continue heparin infusion at 900 units/hr and order confirmatory HL in 8 hours.   12/11: Heparin level resulted @ 0.35, which is therapeutic. Will recheck HL and CBC with am labs.   12/12 AM heparin level 0.19. 1600 unit bolus and increase rate to 1000 units/hr. Recheck in 8 hours.  12/12  1458 HL 0.65. Level is therapeutic. Will reorder level in 6 hours to confirm. CBC with AM labs per protocol   12/12   2100 HL = 0.47 Will continue this pt on current rate and recheck HL on 12/13 with AM labs.    12/13 AM heparin level 0.40. Continue current regimen. Recheck heparin level and CBC with tomorrow AM labs.  12/14 AM heparin level 0.58. Continue current regimen. Recheck heparin level and CBC with  tomorrow AM labs.  Sim Boast, PharmD, BCPS  02/06/17 6:18 AM

## 2017-02-06 NOTE — Care Management (Addendum)
Obtained order for walker- to be provided by [facility].  Added physical therapy to home health referral.  Updated patient and son that anticipate discharge 12.15 if heart rate remains stable and home health will visit 12.16.

## 2017-02-07 LAB — CBC
HEMATOCRIT: 44.9 % (ref 35.0–47.0)
HEMOGLOBIN: 15.1 g/dL (ref 12.0–16.0)
MCH: 31.8 pg (ref 26.0–34.0)
MCHC: 33.7 g/dL (ref 32.0–36.0)
MCV: 94.6 fL (ref 80.0–100.0)
Platelets: 313 10*3/uL (ref 150–440)
RBC: 4.75 MIL/uL (ref 3.80–5.20)
RDW: 14.1 % (ref 11.5–14.5)
WBC: 8.3 10*3/uL (ref 3.6–11.0)

## 2017-02-07 LAB — CULTURE, BLOOD (ROUTINE X 2)
Culture: NO GROWTH
Culture: NO GROWTH
SPECIAL REQUESTS: ADEQUATE
Special Requests: ADEQUATE

## 2017-02-07 MED ORDER — AMIODARONE HCL 200 MG PO TABS: 200.0000 mg | ORAL_TABLET | Freq: Two times a day (BID) | ORAL | 1 refills | Status: DC

## 2017-02-07 MED ORDER — METOPROLOL SUCCINATE ER 100 MG PO TB24: ORAL_TABLET | ORAL | 1 refills | Status: DC

## 2017-02-07 MED ORDER — APIXABAN 2.5 MG PO TABS: 2.5000 mg | ORAL_TABLET | Freq: Two times a day (BID) | ORAL | 1 refills | Status: DC

## 2017-02-07 NOTE — Progress Notes (Signed)
Progress Note  Patient Name: Krystal White Date of Encounter: 02/07/2017  Primary Cardiologist: Kathlyn Sacramento, MD   Subjective   No complaints going home, Eating breakfest   Inpatient Medications    Scheduled Meds: . amiodarone  400 mg Oral BID  . apixaban  2.5 mg Oral BID  . furosemide  20 mg Oral Daily  . metoprolol succinate  100 mg Oral Daily  . simvastatin  20 mg Oral q1800  . sodium chloride flush  3 mL Intravenous Q12H   Continuous Infusions:  PRN Meds: docusate sodium, fluticasone, metoprolol tartrate   Vital Signs    Vitals:   02/06/17 2040 02/06/17 2042 02/07/17 0322 02/07/17 0900  BP:  134/71 (!) 127/54 138/88  Pulse: 92 75 93 (!) 112  Resp: 16  16   Temp: 98.6 F (37 C)  97.8 F (36.6 C) (!) 97.3 F (36.3 C)  TempSrc: Oral  Oral Oral  SpO2: 97%  94% 98%  Weight:   108 lb 14.4 oz (49.4 kg)   Height:        Intake/Output Summary (Last 24 hours) at 02/07/2017 0925 Last data filed at 02/07/2017 0842 Gross per 24 hour  Intake 864.33 ml  Output 650 ml  Net 214.33 ml   Filed Weights   02/05/17 2006 02/06/17 0349 02/07/17 0322  Weight: 109 lb 9.6 oz (49.7 kg) 109 lb 9.6 oz (49.7 kg) 108 lb 14.4 oz (49.4 kg)    Telemetry    Not on telemetry this am - Personally Reviewed  ECG    Not done   Physical Exam   GEN: No acute distress.   Neck: No JVD Cardiac: RRR, no murmurs, rubs, or gallops.  Respiratory: Clear to auscultation bilaterally. GI: Soft, nontender, non-distended  MS: No edema; No deformity. Neuro:  Nonfocal  Psych: Normal affect   Labs    Chemistry Recent Labs  Lab 02/02/17 0739  02/04/17 0509 02/05/17 0616 02/06/17 0409  NA 134*   < > 141 139 136  K 3.6   < > 4.0 4.7 5.1  CL 97*   < > 101 99* 98*  CO2 27   < > 32 29 30  GLUCOSE 114*   < > 119* 118* 106*  BUN 16   < > 22* 22* 27*  CREATININE 0.70   < > 0.73 0.77 0.93  CALCIUM 8.8*   < > 9.3 10.2 9.9  PROT 6.7  --   --   --   --   ALBUMIN 2.9*  --   --   --    --   AST 49*  --   --   --   --   ALT 54  --   --   --   --   ALKPHOS 68  --   --   --   --   BILITOT 1.1  --   --   --   --   GFRNONAA >60   < > >60 >60 53*  GFRAA >60   < > >60 >60 >60  ANIONGAP 10   < > 8 11 8    < > = values in this interval not displayed.     Hematology Recent Labs  Lab 02/05/17 0616 02/06/17 0409 02/07/17 0619  WBC 10.6 8.6 8.3  RBC 4.68 4.50 4.75  HGB 14.6 14.2 15.1  HCT 44.1 42.8 44.9  MCV 94.2 95.1 94.6  MCH 31.2 31.6 31.8  MCHC 33.1 33.2 33.7  RDW  14.2 14.1 14.1  PLT 374 342 313    Cardiac Enzymes Recent Labs  Lab 02/02/17 0739 02/02/17 1000 02/02/17 1332 02/02/17 1810  TROPONINI 0.03* 0.03* <0.03 0.03*   No results for input(s): TROPIPOC in the last 168 hours.   BNPNo results for input(s): BNP, PROBNP in the last 168 hours.   DDimer No results for input(s): DDIMER in the last 168 hours.   Radiology    No results found.  Cardiac Studies   Echo EF 55-60% mild LAE   Patient Profile     83 y.o. female with afib and PVC;s On low dose eliquis due to age EF 55-60% Mild LAE some pulmonary HTN estimated PA 65 mmHg  Assessment & Plan    Afib: continue low dose eliquis Decrease amiodarone to 200 bid on d/c Increase Toprol To 100 mg am and 50 mg in pm. Will arrange outpatient f/u with Dr Fletcher Anon His noted indicated Possible [place] in 3-4 weeks  For questions or updates, please contact [facility] Please consult [email or web address] for contact info under Cardiology/STEMI.      Signed, Jenkins Rouge, MD  02/07/2017, 9:25 AM

## 2017-02-11 NOTE — Discharge Summary (Signed)
[facility] NAME: Krystal White    MR#:  [id number]  [place]:  [birth year]  DATE OF ADMISSION:  02/02/2017 ADMITTING PHYSICIAN: [name], MD  DATE OF DISCHARGE: 02/07/2017 11:51 AM  PRIMARY CARE PHYSICIAN: Derinda Late, MD    ADMISSION DIAGNOSIS:  Acute pulmonary edema (HCC) [J81.0] Atrial fibrillation with rapid ventricular response (HCC) [I48.91] Acute respiratory failure with hypoxia ([place]) [J96.01] New onset of congestive heart failure (HCC) [I50.9]  DISCHARGE DIAGNOSIS:  Principal Problem:   Atrial fibrillation with RVR (HCC) Active Problems:   Acute diastolic CHF (congestive heart failure) ([place])   SECONDARY DIAGNOSIS:   Past Medical History:  Diagnosis Date  . Hypertension     HOSPITAL COURSE:   83 year old female with past medical history of diastolic CHF, atrial fibrillation, hypertension who presented to the hospital due to atrial fibrillation with rapid ventricular response and also CHF.  1. Acute diastolic CHF-this was the cause of patient's shortness of breath.  -patient was diuresed with IV Lasix. She has improved. She is no longer hypoxic. She is being discharged home with follow-up with cardiology as an outpatient.  2. Atrial fibrillation with rapid ventricular response-patient was given pulse doses of IV Cardizem and IV metoprolol. Her heart rate has improved and stabilized on that. She was also started on amiodarone oral bolus. -She still remains in atrial fibrillation but rate controlled. She will continue amiodarone, Toprol. -She was also started on low-dose Eliquis which she will continue. She will follow-up with cardiology next week.  3. Essential hypertension-patient will continue her Toprol.  4. Elevated d-dimer-patient underwent a CT angiogram of her chest which was negative for pulmonary embolism.  5. Hypokalemia-this was replaced and since then has resolved.  6.  Sepsis-this was suspected in the emergency room and patient presented but this has not been ruled out. Patient does not need any antibiotics.   7. Hyperlipidemia - pt. Will cont. Her Simvastatin.    DISCHARGE CONDITIONS:   Stable  CONSULTS OBTAINED:  Treatment Team:  Wellington Hampshire, MD  DRUG ALLERGIES:  No Known Allergies  DISCHARGE MEDICATIONS:   Allergies as of 02/07/2017   No Known Allergies     Medication List    STOP taking these medications   traMADol 50 MG tablet Commonly known as:  ULTRAM     TAKE these medications   amiodarone 200 MG tablet Commonly known as:  PACERONE Take 1 tablet (200 mg total) by mouth 2 (two) times daily.   apixaban 2.5 MG Tabs tablet Commonly known as:  ELIQUIS Take 1 tablet (2.5 mg total) by mouth 2 (two) times daily.   fluticasone 50 MCG/ACT nasal spray Commonly known as:  FLONASE Place 2 sprays into the nose daily.   metoprolol succinate 100 MG 24 hr tablet Commonly known as:  TOPROL-XL Take 100 mg PO in the morning and 50 mg in the evening. What changed:    medication strength  how much to take  how to take this  when to take this  additional instructions   simvastatin 20 MG tablet Commonly known as:  ZOCOR Take 1 tablet by mouth daily.         DISCHARGE INSTRUCTIONS:   DIET:  Cardiac diet  DISCHARGE CONDITION:  Stable  ACTIVITY:  Activity as tolerated  OXYGEN:  Home Oxygen: No.   Oxygen Delivery: room air  DISCHARGE LOCATION:  Home with Home Health PT   If you experience worsening of your  admission symptoms, develop shortness of breath, life threatening emergency, suicidal or homicidal thoughts you must seek medical attention immediately by calling 911 or calling your MD immediately  if symptoms less severe.  You Must read complete instructions/literature along with all the possible adverse reactions/side effects for all the Medicines you take and that have been prescribed to you. Take any  new Medicines after you have completely understood and accpet all the possible adverse reactions/side effects.   Please note  You were cared for by a hospitalist during your hospital stay. If you have any questions about your discharge medications or the care you received while you were in the hospital after you are discharged, you can call the unit and asked to speak with the hospitalist on call if the hospitalist that took care of you is not available. Once you are discharged, your primary care physician will handle any further medical issues. Please note that NO REFILLS for any discharge medications will be authorized once you are discharged, as it is imperative that you return to your primary care physician (or establish a relationship with a primary care physician if you do not have one) for your aftercare needs so that they can reassess your need for medications and monitor your lab values.     Today   Heart rate stable. No chest pain. Ambulated with the help of physical therapy and is being discharged with home health services.  VITAL SIGNS:  Blood pressure 138/88, pulse (!) 112, temperature (!) 97.3 F (36.3 C), temperature source Oral, resp. rate 16, height 5\' 1"  (1.549 m), weight 49.4 kg (108 lb 14.4 oz), SpO2 98 %.  I/O:  No intake or output data in the 24 hours ending 02/11/17 1636  PHYSICAL EXAMINATION:  GENERAL:  83 y.o.-year-old patient lying in the bed with no acute distress.  EYES: Pupils equal, round, reactive to light and accommodation. No scleral icterus. Extraocular muscles intact.  HEENT: Head atraumatic, normocephalic. Oropharynx and nasopharynx clear.  NECK:  Supple, no jugular venous distention. No thyroid enlargement, no tenderness.  LUNGS: Normal breath sounds bilaterally, no wheezing, rales,rhonchi. No use of accessory muscles of respiration.  CARDIOVASCULAR: S1, S2 normal. No murmurs, rubs, or gallops.  ABDOMEN: Soft, non-tender, non-distended. Bowel sounds  present. No organomegaly or mass.  EXTREMITIES: No pedal edema, cyanosis, or clubbing.  NEUROLOGIC: Cranial nerves II through XII are intact. No focal motor or sensory defecits b/l.  PSYCHIATRIC: The patient is alert and oriented x 3.  SKIN: No obvious rash, lesion, or ulcer.   DATA REVIEW:   CBC Recent Labs  Lab 02/07/17 0619  WBC 8.3  HGB 15.1  HCT 44.9  PLT 313    Chemistries  Recent Labs  Lab 02/06/17 0409  NA 136  K 5.1  CL 98*  CO2 30  GLUCOSE 106*  BUN 27*  CREATININE 0.93  CALCIUM 9.9    Cardiac Enzymes No results for input(s): TROPONINI in the last 168 hours.  Microbiology Results  Results for orders placed or performed during the hospital encounter of 02/02/17  Blood Culture (routine x 2)     Status: None   Collection Time: 02/02/17  7:39 AM  Result Value Ref Range Status   Specimen Description BLOOD BLOOD LEFT HAND  Final   Special Requests   Final    BOTTLES DRAWN AEROBIC AND ANAEROBIC Blood Culture adequate volume   Culture NO GROWTH 5 DAYS  Final   Report Status 02/07/2017 FINAL  Final  Blood Culture (routine  x 2)     Status: None   Collection Time: 02/02/17  7:39 AM  Result Value Ref Range Status   Specimen Description BLOOD BLOOD RIGHT HAND  Final   Special Requests   Final    BOTTLES DRAWN AEROBIC AND ANAEROBIC Blood Culture adequate volume   Culture NO GROWTH 5 DAYS  Final   Report Status 02/07/2017 FINAL  Final  Urine culture     Status: Abnormal   Collection Time: 02/02/17  7:39 AM  Result Value Ref Range Status   Specimen Description URINE, CLEAN CATCH  Final   Special Requests NONE  Final   Culture (A)  Final    <10,000 COLONIES/mL INSIGNIFICANT GROWTH Performed at [facility], [address], [place], [place] [address]    Report Status 02/03/2017 FINAL  Final    RADIOLOGY:  No results found.    Management plans discussed with the patient, family and they are in agreement.  CODE STATUS:  Code Status History    Date  Active Date Inactive Code Status Order ID Comments User Context   02/02/2017 11:46 02/07/2017 14:57 DNR [id number]  [name], MD Inpatient    Questions for Most Recent Historical Code Status (Order [id number])    Question Answer Comment   In the event of cardiac or respiratory ARREST Do not call a "code blue"    In the event of cardiac or respiratory ARREST Do not perform Intubation, CPR, defibrillation or ACLS    In the event of cardiac or respiratory ARREST Use medication by any route, position, wound care, and other measures to relive pain and suffering. May use oxygen, suction and manual treatment of airway obstruction as needed for comfort.         Advance Directive Documentation     Most Recent Value  Type of Advance Directive  Living will  Pre-existing out of facility DNR order (yellow form or pink MOST form)  No data  "MOST" Form in Place?  No data      TOTAL TIME TAKING CARE OF THIS PATIENT: 40 minutes.    Henreitta Leber M.D on 02/11/2017 at 4:36 PM  Between 7am to 6pm - Pager - [phone]  After 6pm go to [email or web address] - Proofreader  [facility]  Office  [phone]  CC: Primary care physician; Derinda Late, MD

## 2017-02-13 ENCOUNTER — Ambulatory Visit: Payer: Medicare Other | Attending: Family | Admitting: Family

## 2017-02-13 ENCOUNTER — Other Ambulatory Visit: Payer: Self-pay

## 2017-02-13 ENCOUNTER — Encounter: Payer: Self-pay | Admitting: Family

## 2017-02-13 VITALS — BP 119/58 | HR 81 | Temp 98.5°F | Resp 16 | Ht 61.0 in | Wt 109.5 lb

## 2017-02-13 DIAGNOSIS — Z7951 Long term (current) use of inhaled steroids: Secondary | ICD-10-CM | POA: Diagnosis not present

## 2017-02-13 DIAGNOSIS — I5032 Chronic diastolic (congestive) heart failure: Secondary | ICD-10-CM | POA: Diagnosis not present

## 2017-02-13 DIAGNOSIS — I10 Essential (primary) hypertension: Secondary | ICD-10-CM

## 2017-02-13 DIAGNOSIS — Z79899 Other long term (current) drug therapy: Secondary | ICD-10-CM | POA: Diagnosis not present

## 2017-02-13 DIAGNOSIS — Z8249 Family history of ischemic heart disease and other diseases of the circulatory system: Secondary | ICD-10-CM | POA: Insufficient documentation

## 2017-02-13 DIAGNOSIS — I5033 Acute on chronic diastolic (congestive) heart failure: Secondary | ICD-10-CM | POA: Insufficient documentation

## 2017-02-13 DIAGNOSIS — I110 Hypertensive heart disease with heart failure: Secondary | ICD-10-CM | POA: Insufficient documentation

## 2017-02-13 DIAGNOSIS — Z7901 Long term (current) use of anticoagulants: Secondary | ICD-10-CM | POA: Diagnosis not present

## 2017-02-13 DIAGNOSIS — I4891 Unspecified atrial fibrillation: Secondary | ICD-10-CM | POA: Diagnosis not present

## 2017-02-13 DIAGNOSIS — I509 Heart failure, unspecified: Secondary | ICD-10-CM | POA: Diagnosis present

## 2017-02-13 DIAGNOSIS — M810 Age-related osteoporosis without current pathological fracture: Secondary | ICD-10-CM | POA: Diagnosis not present

## 2017-02-13 NOTE — Patient Instructions (Addendum)
Begin weighing daily and call for an overnight weight gain of > 2 pounds or a weekly weight gain of >5 pounds. 

## 2017-02-13 NOTE — Progress Notes (Signed)
Patient ID: Krystal White, female    DOB: February 19, 1925, 83 y.o.   MRN: [id number]  HPI  Krystal White is a 83 y/o female with a history of HTN, atrial fibrillation, osteoporosis and chronic heart failure.   Echo report from 02/02/17 reviewed and showed an EF of 55-60% along with moderate TR, mild AR/MR and severely elevated PA pressure of 65 mm Hg.  Admitted 02/02/17 due to acute HF along with AF with RVR. Initially needed IV diuretics along with IV cardizem and IV metoprolol. Cardiology consult obtained. Oral medications were adjusted. Discharged home after 5 days.   She presents today for her initial visit with a chief complaint of minimal shortness of breath upon moderate exertion. She describes this as chronic in nature having been present for several years with varying levels of severity. She has associated fatigue, cough, palpitations, light-headedness and difficulty sleeping at night. She denies any chest pain, edema or easy bruising. Would like some assistance in the home to help with her ADL's and managing her home environment so she can remain independent at home.   Past Medical History:  Diagnosis Date  . Arrhythmia    atrial fibrillation  . CHF (congestive heart failure) ([place])   . Hypertension   . Osteoporosis    History reviewed. No pertinent surgical history. Family History  Problem Relation Age of Onset  . Heart failure Sister    Social History   Tobacco Use  . Smoking status: Never Smoker  . Smokeless tobacco: Never Used  Substance Use Topics  . Alcohol use: No   No Known Allergies Prior to Admission medications   Medication Sig Start Date End Date Taking? Authorizing Provider  amiodarone (PACERONE) 200 MG tablet Take 1 tablet (200 mg total) by mouth 2 (two) times daily. 02/07/17  Yes Sainani, Belia Heman, MD  apixaban (ELIQUIS) 2.5 MG TABS tablet Take 1 tablet (2.5 mg total) by mouth 2 (two) times daily. 02/07/17  Yes Sainani, Belia Heman, MD  fluticasone (FLONASE) 50  MCG/ACT nasal spray Place 2 sprays into the nose daily. 12/23/16 12/23/17 Yes [provider]  metoprolol succinate (TOPROL-XL) 100 MG 24 hr tablet Take 100 mg PO in the morning and 50 mg in the evening. 02/07/17  Yes Henreitta Leber, MD  simvastatin (ZOCOR) 20 MG tablet Take 1 tablet by mouth daily. 12/10/16   [provider]    Review of Systems  Constitutional: Positive for fatigue. Negative for appetite change.  HENT: Positive for hearing loss and rhinorrhea. Negative for congestion and sore throat.   Eyes: Negative.   Respiratory: Positive for cough and shortness of breath ("little bit"). Negative for chest tightness.   Cardiovascular: Positive for palpitations. Negative for chest pain and leg swelling.  Gastrointestinal: Negative for abdominal distention and abdominal pain.  Endocrine: Negative.   Genitourinary: Negative.   Musculoskeletal: Negative for back pain and neck pain.  Skin: Negative.   Allergic/Immunologic: Negative.   Neurological: Positive for light-headedness ("unsteady"). Negative for dizziness.  Hematological: Negative for adenopathy. Does not bruise/bleed easily.  Psychiatric/Behavioral: Positive for sleep disturbance (sleeping much during the day/awake at night). Negative for dysphoric mood. The patient is not nervous/anxious.    Vitals:   02/13/17 1122  BP: (!) 119/58  Pulse: 81  Resp: 16  Temp: 98.5 F (36.9 C)  TempSrc: Oral  SpO2: 95%  Weight: 109 lb 8 oz (49.7 kg)  Height: 5\' 1"  (1.549 m)   Wt Readings from Last 3 Encounters:  02/13/17 109  lb 8 oz (49.7 kg)  02/07/17 108 lb 14.4 oz (49.4 kg)  09/08/15 120 lb (54.4 kg)   Lab Results  Component Value Date   CREATININE 0.93 02/06/2017   CREATININE 0.77 02/05/2017   CREATININE 0.73 02/04/2017   Physical Exam  Constitutional: She is oriented to person, place, and time. She appears well-developed and well-nourished.  HENT:  Head: Normocephalic and atraumatic.  Right Ear:  Decreased hearing is noted.  Left Ear: Decreased hearing is noted.  Neck: Normal range of motion. Neck supple. No JVD present.  Cardiovascular: Normal rate. An irregular rhythm present.  Pulmonary/Chest: Effort normal. She has no wheezes. She has no rales.  Abdominal: Soft. She exhibits no distension. There is no tenderness.  Musculoskeletal: She exhibits no edema or tenderness.  Neurological: She is alert and oriented to person, place, and time.  Skin: Skin is warm and dry.  Psychiatric: She has a normal mood and affect. Her behavior is normal. Thought content normal.  Nursing note and vitals reviewed.   Assessment & Plan:  1: Chronic heart failure with preserved ejection fraction- - NYHA class II - euvolemic today - currently not weighing daily but does have some scales. Discussed the importance of weighing daily and to call for an overnight weight gain of >2 pounds or a weekly weight gain of >5 pounds - not adding salt and has been reading food labels for years. Reviewed the importance of closely following a 2000mg  sodium diet and written dietary information was given to her about this. - encouraged her to elevate her legs when she sits for long periods of time - referral made to [facility] for an evaluation/assistance in the home. May need PT or social work  2: HTN- - BP looks good today - saw PCP Baldemar Lenis) 12/23/16 - BMP from 02/06/17 reviewed and showed sodium 136, potassium 5.1 and GFR 53  3: Atrial fibrillation- - currently rate controlled at this time - currently on amiodarone, metoprolol succinate and apixaban - hasn't noticed any increase in bleeding/bruising - did not have cardiology follow-up so an appointment was made with Dr. Fletcher Anon for 04/07/16  Patient did not bring her medications nor a list. Each medication was verbally reviewed with the patient and she was encouraged to bring the bottles to every visit to confirm accuracy of list.  Return in 1 month or sooner for any  questions/problems before then.

## 2017-03-02 ENCOUNTER — Emergency Department: Payer: Medicare Other

## 2017-03-02 ENCOUNTER — Other Ambulatory Visit: Payer: Self-pay

## 2017-03-02 ENCOUNTER — Inpatient Hospital Stay
Admission: EM | Admit: 2017-03-02 | Discharge: 2017-03-09 | DRG: 291 | Disposition: A | Discharge status: Skilled Nursing Facility | Payer: Medicare Other | Attending: Family Medicine | Admitting: Family Medicine

## 2017-03-02 DIAGNOSIS — I5033 Acute on chronic diastolic (congestive) heart failure: Secondary | ICD-10-CM | POA: Diagnosis present

## 2017-03-02 DIAGNOSIS — R001 Bradycardia, unspecified: Secondary | ICD-10-CM

## 2017-03-02 DIAGNOSIS — R9431 Abnormal electrocardiogram [ECG] [EKG]: Secondary | ICD-10-CM

## 2017-03-02 DIAGNOSIS — J9621 Acute and chronic respiratory failure with hypoxia: Secondary | ICD-10-CM | POA: Diagnosis present

## 2017-03-02 DIAGNOSIS — Z7901 Long term (current) use of anticoagulants: Secondary | ICD-10-CM | POA: Diagnosis not present

## 2017-03-02 DIAGNOSIS — Z79899 Other long term (current) drug therapy: Secondary | ICD-10-CM | POA: Diagnosis not present

## 2017-03-02 DIAGNOSIS — R0600 Dyspnea, unspecified: Secondary | ICD-10-CM | POA: Diagnosis not present

## 2017-03-02 DIAGNOSIS — M810 Age-related osteoporosis without current pathological fracture: Secondary | ICD-10-CM | POA: Diagnosis present

## 2017-03-02 DIAGNOSIS — E876 Hypokalemia: Secondary | ICD-10-CM | POA: Diagnosis present

## 2017-03-02 DIAGNOSIS — Z66 Do not resuscitate: Secondary | ICD-10-CM | POA: Diagnosis present

## 2017-03-02 DIAGNOSIS — R0602 Shortness of breath: Secondary | ICD-10-CM

## 2017-03-02 DIAGNOSIS — I272 Pulmonary hypertension, unspecified: Secondary | ICD-10-CM | POA: Diagnosis present

## 2017-03-02 DIAGNOSIS — E785 Hyperlipidemia, unspecified: Secondary | ICD-10-CM | POA: Diagnosis present

## 2017-03-02 DIAGNOSIS — I110 Hypertensive heart disease with heart failure: Principal | ICD-10-CM | POA: Diagnosis present

## 2017-03-02 DIAGNOSIS — I509 Heart failure, unspecified: Secondary | ICD-10-CM

## 2017-03-02 DIAGNOSIS — I480 Paroxysmal atrial fibrillation: Secondary | ICD-10-CM | POA: Diagnosis present

## 2017-03-02 DIAGNOSIS — R0902 Hypoxemia: Secondary | ICD-10-CM

## 2017-03-02 DIAGNOSIS — R778 Other specified abnormalities of plasma proteins: Secondary | ICD-10-CM

## 2017-03-02 DIAGNOSIS — E860 Dehydration: Secondary | ICD-10-CM | POA: Diagnosis present

## 2017-03-02 DIAGNOSIS — J441 Chronic obstructive pulmonary disease with (acute) exacerbation: Secondary | ICD-10-CM | POA: Diagnosis present

## 2017-03-02 DIAGNOSIS — R7989 Other specified abnormal findings of blood chemistry: Secondary | ICD-10-CM

## 2017-03-02 DIAGNOSIS — R911 Solitary pulmonary nodule: Secondary | ICD-10-CM | POA: Diagnosis present

## 2017-03-02 DIAGNOSIS — J90 Pleural effusion, not elsewhere classified: Secondary | ICD-10-CM

## 2017-03-02 DIAGNOSIS — R0603 Acute respiratory distress: Secondary | ICD-10-CM | POA: Diagnosis not present

## 2017-03-02 HISTORY — DX: Paroxysmal atrial fibrillation: I48.0

## 2017-03-02 HISTORY — DX: Pulmonary hypertension, unspecified: I27.20

## 2017-03-02 LAB — BASIC METABOLIC PANEL
ANION GAP: 7 (ref 5–15)
BUN: 15 mg/dL (ref 6–20)
CALCIUM: 9.2 mg/dL (ref 8.9–10.3)
CO2: 29 mmol/L (ref 22–32)
Chloride: 100 mmol/L — ABNORMAL LOW (ref 101–111)
Creatinine, Ser: 1.09 mg/dL — ABNORMAL HIGH (ref 0.44–1.00)
GFR calc non Af Amer: 43 mL/min — ABNORMAL LOW (ref >60)
GFR, EST AFRICAN AMERICAN: 50 mL/min — AB (ref >60)
Glucose, Bld: 107 mg/dL — ABNORMAL HIGH (ref 65–99)
POTASSIUM: 3.4 mmol/L — AB (ref 3.5–5.1)
Sodium: 136 mmol/L (ref 135–145)

## 2017-03-02 LAB — PROTIME-INR
INR: 1.09
Prothrombin Time: 14 seconds (ref 11.4–15.2)

## 2017-03-02 LAB — CBC
HCT: 38.9 % (ref 35.0–47.0)
HEMOGLOBIN: 13 g/dL (ref 12.0–16.0)
MCH: 32 pg (ref 26.0–34.0)
MCHC: 33.5 g/dL (ref 32.0–36.0)
MCV: 95.6 fL (ref 80.0–100.0)
Platelets: 193 10*3/uL (ref 150–440)
RBC: 4.06 MIL/uL (ref 3.80–5.20)
RDW: 15.7 % — AB (ref 11.5–14.5)
WBC: 6.3 10*3/uL (ref 3.6–11.0)

## 2017-03-02 LAB — APTT: aPTT: 31 seconds (ref 24–36)

## 2017-03-02 LAB — INFLUENZA PANEL BY PCR (TYPE A & B)
Influenza A By PCR: NEGATIVE
Influenza B By PCR: NEGATIVE

## 2017-03-02 LAB — BRAIN NATRIURETIC PEPTIDE: B Natriuretic Peptide: 1577 pg/mL — ABNORMAL HIGH (ref 0.0–100.0)

## 2017-03-02 LAB — TROPONIN I: Troponin I: 0.06 ng/mL (ref <0.03)

## 2017-03-02 LAB — LACTIC ACID, PLASMA: LACTIC ACID, VENOUS: 1 mmol/L (ref 0.5–1.9)

## 2017-03-02 MED ORDER — ALBUTEROL SULFATE (2.5 MG/3ML) 0.083% IN NEBU: 2.5000 mg | INHALATION_SOLUTION | Freq: Four times a day (QID) | RESPIRATORY_TRACT | Status: DC | PRN

## 2017-03-02 MED ORDER — POTASSIUM CHLORIDE CRYS ER 20 MEQ PO TBCR
40.0000 meq | EXTENDED_RELEASE_TABLET | Freq: Once | ORAL | Status: AC
  Administered 2017-03-02: 40 meq via ORAL
  Filled 2017-03-02: qty 2

## 2017-03-02 MED ORDER — FLUTICASONE PROPIONATE 50 MCG/ACT NA SUSP
2.0000 | Freq: Every day | NASAL | Status: DC | PRN
  Filled 2017-03-02: qty 16

## 2017-03-02 MED ORDER — IOPAMIDOL (ISOVUE-370) INJECTION 76%
60.0000 mL | Freq: Once | INTRAVENOUS | Status: AC | PRN
  Administered 2017-03-02: 60 mL via INTRAVENOUS

## 2017-03-02 MED ORDER — FUROSEMIDE 10 MG/ML IJ SOLN
40.0000 mg | Freq: Once | INTRAMUSCULAR | Status: AC
  Administered 2017-03-02: 40 mg via INTRAVENOUS
  Filled 2017-03-02: qty 4

## 2017-03-02 MED ORDER — POLYETHYLENE GLYCOL 3350 17 G PO PACK: 17.0000 g | PACK | Freq: Every day | ORAL | Status: DC | PRN

## 2017-03-02 MED ORDER — METOPROLOL TARTRATE 25 MG PO TABS
ORAL_TABLET | ORAL | Status: AC
  Filled 2017-03-02: qty 1

## 2017-03-02 MED ORDER — ACETAMINOPHEN 325 MG PO TABS
650.0000 mg | ORAL_TABLET | Freq: Four times a day (QID) | ORAL | Status: DC | PRN
  Administered 2017-03-09: 650 mg via ORAL
  Filled 2017-03-02: qty 2

## 2017-03-02 MED ORDER — FUROSEMIDE 10 MG/ML IJ SOLN
20.0000 mg | Freq: Two times a day (BID) | INTRAMUSCULAR | Status: DC
  Administered 2017-03-02: 20 mg via INTRAVENOUS
  Filled 2017-03-02: qty 2

## 2017-03-02 MED ORDER — ACETAMINOPHEN 650 MG RE SUPP: 650.0000 mg | Freq: Four times a day (QID) | RECTAL | Status: DC | PRN

## 2017-03-02 MED ORDER — METOPROLOL TARTRATE 25 MG PO TABS
25.0000 mg | ORAL_TABLET | Freq: Two times a day (BID) | ORAL | Status: DC
  Administered 2017-03-02: 25 mg via ORAL

## 2017-03-02 MED ORDER — ORAL CARE MOUTH RINSE
15.0000 mL | Freq: Two times a day (BID) | OROMUCOSAL | Status: DC
  Administered 2017-03-03 – 2017-03-08 (×9): 15 mL via OROMUCOSAL

## 2017-03-02 MED ORDER — APIXABAN 2.5 MG PO TABS
2.5000 mg | ORAL_TABLET | Freq: Two times a day (BID) | ORAL | Status: DC
  Administered 2017-03-02 – 2017-03-09 (×14): 2.5 mg via ORAL
  Filled 2017-03-02 (×14): qty 1

## 2017-03-02 MED ORDER — AMIODARONE HCL 200 MG PO TABS
200.0000 mg | ORAL_TABLET | Freq: Two times a day (BID) | ORAL | Status: DC
  Filled 2017-03-02: qty 1

## 2017-03-02 MED ORDER — ENOXAPARIN SODIUM 40 MG/0.4ML ~~LOC~~ SOLN: 40.0000 mg | SUBCUTANEOUS | Status: DC

## 2017-03-02 NOTE — ED Provider Notes (Signed)
[facility] Emergency Department Provider Note  ____________________________________________  Time seen: Approximately 4:21 PM  I have reviewed the triage vital signs and the nursing notes.   HISTORY  Chief Complaint Shortness of Breath    HPI Krystal White is a 84 y.o. female with a history of HTN, CHF, A. fib, who lives at home, sent by her physical therapist for hypoxia.  The patient reports that she has been in her usual state of health with the exception of some mild shortness of breath and "mucus buildup" that is worse right when she wakes up and goes away as the day progresses.  Today, her physical therapist came to evaluate her and told her that she had "abnormal vital signs and needed to go to the hospital."  The patient denies any significant cough, fever or chills, lightheadedness or syncope, chest pain, or change in her chronic lower extremity edema.  She has no calf pain.  She has no history of DVT or PE.  On arrival to the emergency department, the patient has an O2 sat of 83% with a good tracing on room air, which improves to 98% on 2 L nasal cannula.  Past Medical History:  Diagnosis Date  . Arrhythmia    atrial fibrillation  . CHF (congestive heart failure) ([place])   . Hypertension   . Osteoporosis     Patient Active Problem List   Diagnosis Date Noted  . Chronic diastolic heart failure ([place]) 02/13/2017  . HTN (hypertension) 02/13/2017  . Atrial fibrillation with RVR ([place]) 02/02/2017  . Acute diastolic CHF (congestive heart failure) ([place]) 02/02/2017    No past surgical history on file.  Current Outpatient Rx  . Order #: [id number] Class: Print  . Order #: [id number] Class: Print  . Order #: [id number] Class: Historical Med  . Order #: [id number] Class: Print  . Order #: [id number] Class: Historical Med    Allergies Patient has no known allergies.  Family History  Problem Relation Age of Onset  . Heart failure Sister     Social  History Social History   Tobacco Use  . Smoking status: Never Smoker  . Smokeless tobacco: Never Used  Substance Use Topics  . Alcohol use: No  . Drug use: No    Review of Systems Constitutional: No fever/chills.  No lightheadedness or syncope.  No general malaise. Eyes: No visual changes.  No eye discharge. ENT: No sore throat. No congestion or rhinorrhea. Cardiovascular: Denies chest pain. Denies palpitations. Respiratory: Denies shortness of breath.  Positive "mucus buildup" with mild morning cough. Gastrointestinal: No abdominal pain.  No nausea, no vomiting.  No diarrhea.  No constipation. Genitourinary: Negative for dysuria. Musculoskeletal: Negative for back pain.  Chronic, unchanged lower extremity edema Skin: Negative for rash. Neurological: Negative for headaches. No focal numbness, tingling or weakness.     ____________________________________________   PHYSICAL EXAM:  VITAL SIGNS: ED Triage Vitals  Enc Vitals Group     BP 03/02/17 1614 (!) 180/57     Pulse Rate 03/02/17 1614 (!) 51     Resp 03/02/17 1614 14     Temp 03/02/17 1614 98.2 F (36.8 C)     Temp Source 03/02/17 1614 Oral     SpO2 03/02/17 1614 97 %     Weight 03/02/17 1614 109 lb (49.4 kg)     Height 03/02/17 1614 5\' 1"  (1.549 m)     Head Circumference --      Peak Flow --      Pain Score  03/02/17 1613 0     Pain Loc --      Pain Edu? --      Excl. in [place]? --     Constitutional: Alert and oriented. Well appearing for her age and in no acute distress. Answers questions appropriately. Eyes: Conjunctivae are normal.  EOMI. No scleral icterus.  No eye discharge. EARS: Hard of hearing. Head: Atraumatic. Nose: No congestion/rhinnorhea. Mouth/Throat: Mucous membranes are moist.  Neck: No stridor.  Supple.  No JVD. Cardiovascular: Normal rate, regular rhythm. No murmurs, rubs or gallops.  Respiratory: Normal respiratory effort.  No accessory muscle use or retractions. Lungs CTAB.  No wheezes,  rales or ronchi.  On my examination, I remove her oxygen and she has O2 sats of 87% with a good tracing on room air without any significant distress. Gastrointestinal: Soft, nontender and nondistended.  No guarding or rebound.  No peritoneal signs. Musculoskeletal: Symmetric bilateral LE edema. No ttp in the calves or palpable cords.  Negative Homan's sign. Neurologic:  A&Ox3.  Speech is clear.  Face and smile are symmetric.  EOMI.  Moves all extremities well. Skin:  Skin is warm, dry and intact. No rash noted. Psychiatric: Mood and affect are normal. Speech and behavior are normal.  Normal judgement  ____________________________________________   LABS (all labs ordered are listed, but only abnormal results are displayed)  Labs Reviewed  CBC - Abnormal; Notable for the following components:      Result Value   RDW 15.7 (*)    All other components within normal limits  BASIC METABOLIC PANEL - Abnormal; Notable for the following components:   Potassium 3.4 (*)    Chloride 100 (*)    Glucose, Bld 107 (*)    Creatinine, Ser 1.09 (*)    GFR calc non Af Amer 43 (*)    GFR calc Af Amer 50 (*)    All other components within normal limits  TROPONIN I - Abnormal; Notable for the following components:   Troponin I 0.06 (*)    All other components within normal limits  BRAIN NATRIURETIC PEPTIDE - Abnormal; Notable for the following components:   B Natriuretic Peptide 1,577.0 (*)    All other components within normal limits  BLOOD GAS, VENOUS - Abnormal; Notable for the following components:   Bicarbonate 32.4 (*)    Acid-Base Excess 5.5 (*)    All other components within normal limits  CULTURE, BLOOD (ROUTINE X 2)  CULTURE, BLOOD (ROUTINE X 2)  LACTIC ACID, PLASMA  INFLUENZA PANEL BY PCR (TYPE A & B)  LACTIC ACID, PLASMA  APTT  PROTIME-INR   ____________________________________________  EKG  ED ECG REPORT I, [name], the attending physician, personally viewed and  interpreted this ECG.   Date: 03/02/2017  EKG Time: 1751  Rate: 53  Rhythm: sinus bradycardia  Axis: normal  Intervals:Prolonged QTc  ST&T Change: No STEMI  ____________________________________________  RADIOLOGY  Ct Angio Chest Pe W And/or Wo Contrast  Result Date: 03/02/2017 CLINICAL DATA:  84 year old female with low oxygen saturation. Shortness breath. History of congestive heart failure. Initial encounter. EXAM: CT ANGIOGRAPHY CHEST WITH CONTRAST TECHNIQUE: Multidetector CT imaging of the chest was performed using the standard protocol during bolus administration of intravenous contrast. Multiplanar CT image reconstructions and MIPs were obtained to evaluate the vascular anatomy. CONTRAST:  39mL ISOVUE-370 IOPAMIDOL (ISOVUE-370) INJECTION 76% COMPARISON:  02/03/2017 CT. FINDINGS: Cardiovascular:  No pulmonary embolus. Cardiomegaly.  Coronary artery calcification. Atherosclerotic changes thoracic aorta without aneurysm. Limited for evaluating  for dissection. No obvious dissection noted. Calcified plaque origin great vessels. Mediastinum/Nodes: No mediastinal or hilar adenopathy. Collapsed esophagus appear slightly thickened. Lungs/Pleura: Emphysematous changes with superimposed mild pulmonary vascular congestion. Slight enlargement of bilateral pleural effusions which are slightly loculated and greater on the right. Basilar atelectasis greater on the right. Infectious infiltrate within the right lung base felt to be less likely consideration. Stable right upper lobe 4 mm nodule (series 6, image 38). Scarring lung apices stable. Upper Abdomen: Left lobe liver 2.3 cm cyst suspected without change. Tiny right lobe liver hypodensities too small to characterize, without change and may represent cysts. Left renal 9 mm hyperdense lesion probably a hyperdense cyst. Hyperplasia adrenal glands. Atherosclerotic changes of of abdominal aorta and origin of the celiac/ superior mesenteric artery.  Musculoskeletal: Compression fracture T8 with anterior wedge compression deformity with 90% loss of height centrally and slight retropulsion posterior inferior aspect contacting the ventral cord unchanged. Remote T3 and T4 superior endplate compression fracture. Review of the MIP images confirms the above findings. IMPRESSION: No pulmonary embolus noted. Emphysematous changes with superimposed mild pulmonary vascular congestion. Mild enlargement of bilateral pleural effusions which are slightly loculated and greater on the right. Basilar atelectasis greater on the right. Infectious infiltrate within the right lung base felt to be less likely consideration. Stable right upper lobe 4 mm nodule (series 6, image 38). No follow-up needed if patient is low-risk. Non-contrast chest CT can be considered in 12 months if patient is high-risk. This recommendation follows the consensus statement: Guidelines for Management of Incidental Pulmonary Nodules Detected on CT Images: From the Fleischner Society 2017; Radiology 2017; 284:228-243. Cardiomegaly.  Coronary artery calcifications. Similar appearance of T8 compression fracture mild retropulsion. No significant change T3 and T4 superior endplate compression fracture. Aortic Atherosclerosis (ICD10-I70.0). Emphysema (ICD10-J43.9). Electronically Signed   By: Genia Del M.D.   On: 03/02/2017 17:53    ____________________________________________   PROCEDURES  Procedure(s) performed: None  Procedures  Critical Care performed: Yes, see critical care note(s) ____________________________________________   INITIAL IMPRESSION / ASSESSMENT AND PLAN / ED COURSE  Pertinent labs & imaging results that were available during my care of the patient were reviewed by me and considered in my medical decision making (see chart for details).  84 y.o. female with a history of CHF sent by her physical therapist for hypoxia.  On arrival to the emergency department, she is  hypertensive and has an O2 sat of 83% on room air.  I do not hear any focal infiltrates on her pulmonary examination, but I would be concerned about pneumonia, as well as PE.  CHF is also possible.  Plan to continue to keep her on the 2 L of supplemental oxygen, and get a CT scan of her chest as well as basic laboratory studies including troponin and BNP.  The patient will be admitted for further evaluation and treatment.  ----------------------------------------- 5:35 PM on 03/02/2017 -----------------------------------------  The patient has a significantly elevated BNP, and we do not have any priors for comparison.  Today, her BNP is greater than 1500.  She also has an elevated troponin of 0.06; this may be related to strain from CHF exacerbation, although an STEMI is possible.  The patient does take Eliquis and is not currently having any discomfort or chest pain.  The patient has a mild hypokalemia which I will supplement, and a mild renal insufficiency, but given her CHF exacerbation and hypoxia, this will be monitored and no fluid bolus has been ordered.  I am awaiting the patient's CT scan results, and will plan admission for further evaluation and treatment.  ----------------------------------------- 5:58 PM on 03/02/2017 -----------------------------------------  Patient is continued to desaturate in the high 90s on 2 L nasal cannula.  Her CT angiogram does not show any pulmonary embolus but does show bilateral pleural effusions which are worse than prior, as well as some vascular congestion.  Given the CHF exacerbation, I have ordered a small dose of Lasix although I am cognizant about her renal insufficiency.  At this time, the patient will be admitted to the hospitalist service.   CRITICAL CARE Performed by: [name]   Total critical care time: 45 minutes  Critical care time was exclusive of separately billable procedures and treating other patients.  Critical care  was necessary to treat or prevent imminent or life-threatening deterioration.  Critical care was time spent personally by me on the following activities: development of treatment plan with patient and/or surrogate as well as nursing, discussions with consultants, evaluation of patient's response to treatment, examination of patient, obtaining history from patient or surrogate, ordering and performing treatments and interventions, ordering and review of laboratory studies, ordering and review of radiographic studies, pulse oximetry and re-evaluation of patient's condition.     ____________________________________________  FINAL CLINICAL IMPRESSION(S) / ED DIAGNOSES  Final diagnoses:  Hypoxia  Acute on chronic congestive heart failure, unspecified heart failure type (HCC)  Elevated troponin  Hypokalemia  Sinus bradycardia  Prolonged Q-T interval on ECG  Bilateral pleural effusion         NEW MEDICATIONS STARTED DURING THIS VISIT:  This SmartLink is deprecated. Use AVSMEDLIST instead to display the medication list for a patient.    [name], MD 03/02/17 [phone]

## 2017-03-02 NOTE — ED Triage Notes (Signed)
Pt via ems from home after her physical therapist called her pcp and they advised calling ems. Pt O2 sat 87% on RA when ems came and HR In 50's. EMS placed her on 2L O2 and it increased to 97%. Therapist reports no signs or symptoms of sob. When pt arrived, her O2 sat on RA was 83, and increased to 97 on 2L. Pt alert & oriented with NAD noted.

## 2017-03-02 NOTE — ED Notes (Signed)
Attempt to call report to 2A. RN will call back

## 2017-03-02 NOTE — H&P (Signed)
[place] [facility] [place] NAME: Krystal White    MR#:  [id number]  DATE OF BIRTH:  [birth year]  DATE OF ADMISSION:  03/02/2017  PRIMARY CARE PHYSICIAN: Derinda Late, MD   REQUESTING/REFERRING PHYSICIAN: Dr. Mariea Clonts  CHIEF COMPLAINT:   Increasing shortness of breath not feeling well HISTORY OF PRESENT ILLNESS:  Krystal White  is a 84 y.o. female with a known history of congestive heart failure diastolic, hypertension, chronic A. fib on oral anticoagulation comes to the emergency room from home after she started having increasing shortness of breath.  Her physical therapist came to evaluate her told her that she had abnormal vital signs needed to go to the hospital.  Patient sats were 82% on room air.  She is 98% on 2 L nasal cannula oxygen.  She has elevated BNP and her chest x-ray is consistent with congestive heart failure.  She received IV Lasix in the ER she is being admitted for acute on chronic congestive heart failure with acute on chronic rapid A. fib with RVR.  PAST MEDICAL HISTORY:   Past Medical History:  Diagnosis Date  . Arrhythmia    atrial fibrillation  . CHF (congestive heart failure) ([place])   . Hypertension   . Osteoporosis     PAST SURGICAL HISTOIRY:  No past surgical history on file.  SOCIAL HISTORY:   Social History   Tobacco Use  . Smoking status: Never Smoker  . Smokeless tobacco: Never Used  Substance Use Topics  . Alcohol use: No    FAMILY HISTORY:   Family History  Problem Relation Age of Onset  . Heart failure Sister     DRUG ALLERGIES:  No Known Allergies  REVIEW OF SYSTEMS:  Review of Systems  Constitutional: Negative for chills, fever and weight loss.  HENT: Negative for ear discharge, ear pain and nosebleeds.   Eyes: Negative for blurred vision, pain and discharge.  Respiratory: Positive for shortness of breath. Negative for sputum production, wheezing and stridor.    Cardiovascular: Negative for chest pain, palpitations, orthopnea and PND.  Gastrointestinal: Negative for abdominal pain, diarrhea and vomiting.  Genitourinary: Negative for frequency and urgency.  Musculoskeletal: Negative for back pain and joint pain.  Neurological: Positive for weakness. Negative for sensory change, speech change and focal weakness.  Psychiatric/Behavioral: Negative for depression and hallucinations. The patient is not nervous/anxious.      MEDICATIONS AT HOME:   Prior to Admission medications   Medication Sig Start Date End Date Taking? Authorizing Provider  amiodarone (PACERONE) 200 MG tablet Take 1 tablet (200 mg total) by mouth 2 (two) times daily. 02/07/17  Yes Sainani, Belia Heman, MD  apixaban (ELIQUIS) 2.5 MG TABS tablet Take 1 tablet (2.5 mg total) by mouth 2 (two) times daily. 02/07/17  Yes Sainani, Belia Heman, MD  fluticasone (FLONASE) 50 MCG/ACT nasal spray Place 2 sprays into the nose daily. 12/23/16 12/23/17 Yes [provider]  metoprolol succinate (TOPROL-XL) 100 MG 24 hr tablet Take 100 mg PO in the morning and 50 mg in the evening. 02/07/17  Yes Henreitta Leber, MD  simvastatin (ZOCOR) 20 MG tablet Take 1 tablet by mouth daily. 12/10/16   [provider]      VITAL SIGNS:  Blood pressure (!) 180/57, pulse (!) 51, temperature 98.2 F (36.8 C), temperature source Oral, resp. rate 14, height 5\' 1"  (1.549 m), weight 49.4 kg (109 lb), SpO2 97 %.  PHYSICAL EXAMINATION:  GENERAL:  84 y.o.-year-old  patient lying in the bed with no acute distress.  Thin cachectic EYES: Pupils equal, round, reactive to light and accommodation. No scleral icterus. Extraocular muscles intact.  HEENT: Head atraumatic, normocephalic. Oropharynx and nasopharynx clear.  NECK:  Supple, no jugular venous distention. No thyroid enlargement, no tenderness.  LUNGS: Normal breath sounds bilaterally, no wheezing, ++rales,rhonchi or crepitation. No use of accessory muscles of  respiration.  CARDIOVASCULAR: S1, S2 normal. No murmurs, rubs, or gallops. Tachycardia+ ABDOMEN: Soft, nontender, nondistended. Bowel sounds present. No organomegaly or mass.  EXTREMITIES: No pedal edema, cyanosis, or clubbing.  NEUROLOGIC: Cranial nerves II through XII are intact. Muscle strength 5/5 in all extremities. Sensation intact. Gait not checked.  PSYCHIATRIC: The patient is alert and oriented x2 SKIN: No obvious rash, lesion, or ulcer.   LABORATORY PANEL:   CBC Recent Labs  Lab 03/02/17 1619  WBC 6.3  HGB 13.0  HCT 38.9  PLT 193   ------------------------------------------------------------------------------------------------------------------  Chemistries  Recent Labs  Lab 03/02/17 1619  NA 136  K 3.4*  CL 100*  CO2 29  GLUCOSE 107*  BUN 15  CREATININE 1.09*  CALCIUM 9.2   ------------------------------------------------------------------------------------------------------------------  Cardiac Enzymes Recent Labs  Lab 03/02/17 1619  TROPONINI 0.06*   ------------------------------------------------------------------------------------------------------------------  RADIOLOGY:  Ct Angio Chest Pe W And/or Wo Contrast  Result Date: 03/02/2017 CLINICAL DATA:  84 year old female with low oxygen saturation. Shortness breath. History of congestive heart failure. Initial encounter. EXAM: CT ANGIOGRAPHY CHEST WITH CONTRAST TECHNIQUE: Multidetector CT imaging of the chest was performed using the standard protocol during bolus administration of intravenous contrast. Multiplanar CT image reconstructions and MIPs were obtained to evaluate the vascular anatomy. CONTRAST:  31mL ISOVUE-370 IOPAMIDOL (ISOVUE-370) INJECTION 76% COMPARISON:  02/03/2017 CT. FINDINGS: Cardiovascular:  No pulmonary embolus. Cardiomegaly.  Coronary artery calcification. Atherosclerotic changes thoracic aorta without aneurysm. Limited for evaluating for dissection. No obvious dissection noted.  Calcified plaque origin great vessels. Mediastinum/Nodes: No mediastinal or hilar adenopathy. Collapsed esophagus appear slightly thickened. Lungs/Pleura: Emphysematous changes with superimposed mild pulmonary vascular congestion. Slight enlargement of bilateral pleural effusions which are slightly loculated and greater on the right. Basilar atelectasis greater on the right. Infectious infiltrate within the right lung base felt to be less likely consideration. Stable right upper lobe 4 mm nodule (series 6, image 38). Scarring lung apices stable. Upper Abdomen: Left lobe liver 2.3 cm cyst suspected without change. Tiny right lobe liver hypodensities too small to characterize, without change and may represent cysts. Left renal 9 mm hyperdense lesion probably a hyperdense cyst. Hyperplasia adrenal glands. Atherosclerotic changes of of abdominal aorta and origin of the celiac/ superior mesenteric artery. Musculoskeletal: Compression fracture T8 with anterior wedge compression deformity with 90% loss of height centrally and slight retropulsion posterior inferior aspect contacting the ventral cord unchanged. Remote T3 and T4 superior endplate compression fracture. Review of the MIP images confirms the above findings. IMPRESSION: No pulmonary embolus noted. Emphysematous changes with superimposed mild pulmonary vascular congestion. Mild enlargement of bilateral pleural effusions which are slightly loculated and greater on the right. Basilar atelectasis greater on the right. Infectious infiltrate within the right lung base felt to be less likely consideration. Stable right upper lobe 4 mm nodule (series 6, image 38). No follow-up needed if patient is low-risk. Non-contrast chest CT can be considered in 12 months if patient is high-risk. This recommendation follows the consensus statement: Guidelines for Management of Incidental Pulmonary Nodules Detected on CT Images: From the Fleischner Society 2017; Radiology 2017;  284:228-243. Cardiomegaly.  Coronary  artery calcifications. Similar appearance of T8 compression fracture mild retropulsion. No significant change T3 and T4 superior endplate compression fracture. Aortic Atherosclerosis (ICD10-I70.0). Emphysema (ICD10-J43.9). Electronically Signed   By: Genia Del M.D.   On: 03/02/2017 17:53    EKG:  A. fib with RVR  IMPRESSION AND PLAN:   Krystal White  is a 84 y.o. female with a known history of congestive heart failure diastolic, hypertension, chronic A. fib on oral anticoagulation comes to the emergency room from home after she started having increasing shortness of breath.  Her physical therapist came to evaluate her told her that she had abnormal vital signs needed to go to the hospital.  Patient sats were 82% on room air.  She is 98% on 2 L nasal cannula oxygen.  She has elevated BNP and her chest x-ray is consistent with congestive heart failure.   1.  Acute on chronic diastolic congestive heart failure--- this is the cause for patient's shortness of breath -Admit to telemetry -IV Lasix 20 twice daily -Monitor I's and O's, creatinine -Wean oxygen as needed.  Patient does not use outpatient oxygen  2.  A. fib with RVR--- uncontrolled secondary to #1 -Resume beta-blockers -Resume Eliquis -Continue her amiodarone. -Etiology consultation if needed  3.  Hyperlipidemia  4.  DVT prophylaxis already on Eliquis  NO Family members present in the ER   All the records are reviewed and case discussed with ED provider. Management plans discussed with the patient, family and they are in agreement.  CODE STATUS:DNR  TOTAL TIME TAKING CARE OF THIS PATIENT: *50* minutes.    Fritzi Mandes M.D on 03/02/2017 at 6:52 PM  Between 7am to 6pm - Pager - [phone]  After 6pm go to [email or web address] - password [email or web address] [facility]  [facility]  Office  [phone]  CC: Primary care physician; Derinda Late, MD

## 2017-03-03 ENCOUNTER — Encounter: Payer: Self-pay | Admitting: Physician Assistant

## 2017-03-03 DIAGNOSIS — I5033 Acute on chronic diastolic (congestive) heart failure: Secondary | ICD-10-CM

## 2017-03-03 DIAGNOSIS — I480 Paroxysmal atrial fibrillation: Secondary | ICD-10-CM

## 2017-03-03 LAB — BASIC METABOLIC PANEL
ANION GAP: 8 (ref 5–15)
BUN: 13 mg/dL (ref 6–20)
CALCIUM: 8.6 mg/dL — AB (ref 8.9–10.3)
CO2: 33 mmol/L — ABNORMAL HIGH (ref 22–32)
CREATININE: 0.94 mg/dL (ref 0.44–1.00)
Chloride: 99 mmol/L — ABNORMAL LOW (ref 101–111)
GFR calc Af Amer: >60 mL/min (ref >60)
GFR, EST NON AFRICAN AMERICAN: 52 mL/min — AB (ref >60)
Glucose, Bld: 98 mg/dL (ref 65–99)
Potassium: 3.3 mmol/L — ABNORMAL LOW (ref 3.5–5.1)
SODIUM: 140 mmol/L (ref 135–145)

## 2017-03-03 LAB — MAGNESIUM: MAGNESIUM: 1.5 mg/dL — AB (ref 1.7–2.4)

## 2017-03-03 MED ORDER — METOPROLOL TARTRATE 25 MG PO TABS
12.5000 mg | ORAL_TABLET | Freq: Two times a day (BID) | ORAL | Status: DC
  Administered 2017-03-03 – 2017-03-09 (×12): 12.5 mg via ORAL
  Filled 2017-03-03 (×13): qty 1

## 2017-03-03 MED ORDER — MAGNESIUM SULFATE 2 GM/50ML IV SOLN
2.0000 g | Freq: Once | INTRAVENOUS | Status: AC
  Administered 2017-03-03: 2 g via INTRAVENOUS
  Filled 2017-03-03: qty 50

## 2017-03-03 MED ORDER — SODIUM CHLORIDE 0.9% FLUSH
3.0000 mL | Freq: Two times a day (BID) | INTRAVENOUS | Status: DC
  Administered 2017-03-03 – 2017-03-09 (×11): 3 mL via INTRAVENOUS

## 2017-03-03 MED ORDER — POTASSIUM CHLORIDE CRYS ER 20 MEQ PO TBCR
40.0000 meq | EXTENDED_RELEASE_TABLET | Freq: Once | ORAL | Status: AC
  Administered 2017-03-03: 40 meq via ORAL
  Filled 2017-03-03: qty 2

## 2017-03-03 MED ORDER — FUROSEMIDE 40 MG PO TABS
40.0000 mg | ORAL_TABLET | Freq: Every day | ORAL | Status: DC
  Administered 2017-03-03 – 2017-03-09 (×6): 40 mg via ORAL
  Filled 2017-03-03 (×6): qty 1

## 2017-03-03 MED ORDER — AMIODARONE HCL 200 MG PO TABS
200.0000 mg | ORAL_TABLET | Freq: Every day | ORAL | Status: DC
  Administered 2017-03-03 – 2017-03-09 (×7): 200 mg via ORAL
  Filled 2017-03-03 (×7): qty 1

## 2017-03-03 NOTE — Progress Notes (Signed)
Amiodarone held per MD Duane Boston r/t HR 47.

## 2017-03-03 NOTE — Evaluation (Signed)
Physical Therapy Evaluation Patient Details Name: Krystal White MRN: [id number] DOB: 11-21-1924 Today's Date: 03/03/2017   History of Present Illness  Pt admitted for CHF. Of note, negative for PE although positive for R upper lobe nodule. History includes CHF, HTN, and chronic A fib. Pt complains of SOB symptoms, currently on 2L of O2.  Clinical Impression  Pt is a pleasant 84 year old female who was admitted for CHF exacerbation. Pt performs bed mobility with supervision, transfers with supervision, and ambulation with cga and RW. Pt very impulsive and needs several cues to wait for therapist, tries to stand up multiple times independently. During ambulation, pt impulsive and IV dislodged. Pt also with incontinent episode on floor during standing. RN and aide called for assist. Needs +2 for equipment during hospital stay. Unable to progress further ambulation as pt with quick desat while on 2L of O2 to 80%. Despite pursed lip breathing, only able to improve to 85%. RN in room to assess. Pt demonstrates deficits with strength/endurance/mobility. Would benefit from skilled PT to address above deficits and promote optimal return to PLOF. Recommend transition to [place] upon discharge from acute hospitalization. Will need close supervision for OOB mobility at home for safety.       Follow Up Recommendations Home health PT;Supervision for mobility/OOB    Equipment Recommendations  None recommended by PT    Recommendations for Other Services       Precautions / Restrictions Precautions Precautions: Fall Restrictions Weight Bearing Restrictions: No      Mobility  Bed Mobility Overal bed mobility: Needs Assistance Bed Mobility: Supine to Sit     Supine to sit: Supervision     General bed mobility comments: safe technique with pt able to follow commands, although impulsive and needs cues to wait for therapist prior to performing OOB mobility.  Transfers Overall transfer level: Needs  assistance Equipment used: Rolling walker (2 wheeled) Transfers: Sit to/from Stand Sit to Stand: Supervision         General transfer comment: slightly unsteady when initially standing. Impulsive. Needs cues for safe hand placement.  Ambulation/Gait Ambulation/Gait assistance: Min guard Ambulation Distance (Feet): 20 Feet Assistive device: Rolling walker (2 wheeled) Gait Pattern/deviations: Step-to pattern     General Gait Details: +2 used for equipment. Pt unsteady, however no apparent LOB noted. All mobility performed on 2L of O2. Due to impulsive nature, IV came out during ambulation, RN notified.  Stairs            Wheelchair Mobility    Modified Rankin (Stroke Patients Only)       Balance Overall balance assessment: Needs assistance Sitting-balance support: Feet supported Sitting balance-Leahy Scale: Normal     Standing balance support: Bilateral upper extremity supported Standing balance-Leahy Scale: Good                               Pertinent Vitals/Pain Pain Assessment: No/denies pain    Home Living Family/patient expects to be discharged to:: Private residence Living Arrangements: Alone Available Help at Discharge: Family;Available 24 hours/day Type of Home: Mobile home Home Access: Ramped entrance     Home Layout: One level Home Equipment: Walker - standard;Cane - single point;Bedside commode;Grab bars - tub/shower;Grab bars - toilet      Prior Function Level of Independence: Independent with assistive device(s)         Comments: Pt previously ambulating with SPC, however recently started using RW due to  hospital stay     Hand Dominance        Extremity/Trunk Assessment   Upper Extremity Assessment Upper Extremity Assessment: Overall WFL for tasks assessed    Lower Extremity Assessment Lower Extremity Assessment: Generalized weakness(B LE grossly 4/5)       Communication   Communication: HOH;Other (comment)   Cognition Arousal/Alertness: Awake/alert Behavior During Therapy: Impulsive Overall Cognitive Status: Within Functional Limits for tasks assessed                                        General Comments      Exercises     Assessment/Plan    PT Assessment Patient needs continued PT services  PT Problem List Decreased strength;Decreased balance;Decreased mobility;Decreased safety awareness       PT Treatment Interventions Gait training;DME instruction;Therapeutic exercise;Balance training    PT Goals (Current goals can be found in the Care Plan section)  Acute Rehab PT Goals Patient Stated Goal: to go home tomorrow PT Goal Formulation: With patient Time For Goal Achievement: 03/17/17 Potential to Achieve Goals: Good    Frequency Min 2X/week   Barriers to discharge        Co-evaluation               AM-PAC PT "6 Clicks" Daily Activity  Outcome Measure Difficulty turning over in bed (including adjusting bedclothes, sheets and blankets)?: None Difficulty moving from lying on back to sitting on the side of the bed? : None Difficulty sitting down on and standing up from a chair with arms (e.g., wheelchair, bedside commode, etc,.)?: A Little Help needed moving to and from a bed to chair (including a wheelchair)?: A Little Help needed walking in hospital room?: A Little Help needed climbing 3-5 steps with a railing? : A Little 6 Click Score: 20    End of Session Equipment Utilized During Treatment: Gait belt;Oxygen Activity Tolerance: Patient tolerated treatment well Patient left: in chair;with chair alarm set;with nursing/sitter in room Nurse Communication: Mobility status PT Visit Diagnosis: Unsteadiness on feet (R26.81);Muscle weakness (generalized) (M62.81);Difficulty in walking, not elsewhere classified (R26.2)    Time: 9794-8016 PT Time Calculation (min) (ACUTE ONLY): 31 min   Charges:   PT Evaluation $PT Eval Low Complexity: 1 Low      PT G CodesGreggory Stallion, PT, DPT [phone]   Leeum Sankey 03/03/2017, 3:15 PM

## 2017-03-03 NOTE — Care Management Note (Signed)
Case Management Note  Patient Details  Name: SYENNA NAZIR MRN: [id number] Date of Birth: Jul 06, 1924  Subjective/Objective:                 Admitted from home with congestive heart failure. Chronic Eliquis. Followed at [facility]. Was referred to [facility] for SN and PT when discharged December 12.15.2018.    Action/Plan:  Notified [facility] that patient is admitted and informed patient is currently open to care  Expected Discharge Date:                  Expected Discharge Plan:     In-House Referral:     Discharge planning Services     Post Acute Care Choice:  Resumption of Svcs/PTA Provider Choice offered to:  NA  DME Arranged:    DME Agency:     HH Arranged:  RN, PT [place] Agency:  [place]  Status of Service:  In process, will continue to follow  If discussed at Long Length of Stay Meetings, dates discussed:    Additional Comments:  Katrina Stack, RN 03/03/2017, 11:50 AM

## 2017-03-03 NOTE — Progress Notes (Signed)
MD order to keep oxygen saturations greater than or equal to 92%.

## 2017-03-03 NOTE — Consult Note (Signed)
Cardiology Consultation:   Patient ID: Krystal White; [id number]; 1924-06-10   Admit date: 03/02/2017 Date of Consult: 03/03/2017  Primary Care Provider: Derinda Late, MD Primary Cardiologist:    Patient Profile:   Krystal White is a 84 y.o. female with a hx of PAF on Elquis, chronic diastolic CHF/pulmonary HTN, HTN, osteoporosis, and HOH who is being seen today for the evaluation of intermittent SOB only in the morning, lasting ~ 1 hour at the request of Dr. Posey Pronto.  History of Present Illness:   Krystal White was recently admitted in mid 01/2017 for new onset Afib with RVR and acute diastolic CHF/pulmonary HTN. Echo on 02/02/17 showed EF 55-60%, normal wall motion, mild MR/AI, moderately dilated RV with mildly reduced RVSF, severely dilated RA, moderate TR, PASP 65 mmHg. She was diuresed with IV Lasix with a discharge weight of 108 pounds. She was not placed on a diuretic at time of discharge. She was rate controlled and started on Eliquis 2.5 mg bid for anticoagulation. She followed up with the [facility] on 12.21.2018. Weight of 109 pounds. No changes were made. She returned to [facility] on 03/02/17 with reported SOB. She indicates she was told "I have fluid on my lungs" by her HHPT. She has noted SOB every morning since she was admitted in 01/2017 that lasts for ~ 1 hour before self resolving. She has denied any SOB. Upon her arrival to [facility] she was noted to be in sinus bradycardia with heart rate in the 50s bpm. She underwent CTA chest that was negative for PE though did show COPD with vascular congestion and bilateral pleural effusions that were slightly loculated. BNP 1577. Troponin 0.06, not trended. Flu negative. Blood culture no growth to date x 2. Potassium 3.4-->3.3, magnesium 1.5. SCr 1.09-->0.94. She was given IV Lasix 60 mg total and continued on home medications. IM mentions the patient being in Afib with RVR, though there is no evidence of this being the case.  Cardiology was asked to evaluate the patient's SOB.      Past Medical History:  Diagnosis Date  . Hypertension   . Osteoporosis   . PAF (paroxysmal atrial fibrillation) ([place])   . Pulmonary hypertension ([place])     History reviewed. No pertinent surgical history.   Home Meds: Prior to Admission medications   Medication Sig Start Date End Date Taking? Authorizing Provider  amiodarone (PACERONE) 200 MG tablet Take 1 tablet (200 mg total) by mouth 2 (two) times daily. 02/07/17  Yes Sainani, Belia Heman, MD  apixaban (ELIQUIS) 2.5 MG TABS tablet Take 1 tablet (2.5 mg total) by mouth 2 (two) times daily. 02/07/17  Yes Sainani, Belia Heman, MD  fluticasone (FLONASE) 50 MCG/ACT nasal spray Place 2 sprays into the nose daily. 12/23/16 12/23/17 Yes [provider]  metoprolol succinate (TOPROL-XL) 100 MG 24 hr tablet Take 100 mg PO in the morning and 50 mg in the evening. 02/07/17  Yes Henreitta Leber, MD  simvastatin (ZOCOR) 20 MG tablet Take 1 tablet by mouth daily. 12/10/16   [provider]    Inpatient Medications: Scheduled Meds: . amiodarone  200 mg Oral Daily  . apixaban  2.5 mg Oral BID  . furosemide  40 mg Oral Daily  . mouth rinse  15 mL Mouth Rinse BID  . metoprolol tartrate  12.5 mg Oral BID   Continuous Infusions: . magnesium sulfate 1 - 4 g bolus IVPB 2 g (03/03/17 1050)   PRN Meds: acetaminophen **  OR** acetaminophen, albuterol, fluticasone, polyethylene glycol  Allergies:  No Known Allergies  Social History:   Social History   Socioeconomic History  . Marital status: Widowed    Spouse name: Not on file  . Number of children: Not on file  . Years of education: Not on file  . Highest education level: Not on file  Social Needs  . Financial resource strain: Not hard at all  . Food insecurity - worry: Patient refused  . Food insecurity - inability: Patient refused  . Transportation needs - medical: Yes  . Transportation needs - non-medical: Yes    Occupational History  . Not on file  Tobacco Use  . Smoking status: Never Smoker  . Smokeless tobacco: Never Used  Substance and Sexual Activity  . Alcohol use: No  . Drug use: No  . Sexual activity: No  Other Topics Concern  . Not on file  Social History Narrative  . Not on file     Family History:   Family History  Problem Relation Age of Onset  . Heart failure Sister     ROS:  Review of Systems  Constitutional: Positive for malaise/fatigue. Negative for chills, diaphoresis, fever and weight loss.  HENT: Negative for congestion.   Eyes: Negative for discharge and redness.  Respiratory: Positive for shortness of breath. Negative for cough, hemoptysis, sputum production and wheezing.        SOB every morning for ~ 1 hour, then symptom spontaneously resolves without intervention on her part  Cardiovascular: Negative for chest pain, palpitations, orthopnea, claudication, leg swelling and PND.  Gastrointestinal: Negative for abdominal pain, blood in stool, heartburn, melena, nausea and vomiting.  Genitourinary: Negative for hematuria.  Musculoskeletal: Negative for falls and myalgias.  Skin: Negative for rash.  Neurological: Positive for weakness. Negative for dizziness, tingling, tremors, sensory change, speech change, focal weakness and loss of consciousness.  Endo/Heme/Allergies: Does not bruise/bleed easily.  Psychiatric/Behavioral: Negative for substance abuse. The patient is not nervous/anxious.       Physical Exam/Data:   Vitals:   03/02/17 2016 03/03/17 0126 03/03/17 0314 03/03/17 0841  BP: (!) 127/97  (!) 138/45 (!) 126/42  Pulse: (!) 57  (!) 49 63  Resp:   18 18  Temp: (!) 97.5 F (36.4 C)  98 F (36.7 C) 98.3 F (36.8 C)  TempSrc: Oral     SpO2: 100%  94% 96%  Weight:  110 lb 8 oz (50.1 kg)    Height:        Intake/Output Summary (Last 24 hours) at 03/03/2017 1144 Last data filed at 03/03/2017 1024 Gross per 24 hour  Intake 120 ml  Output 1950 ml  Net  -1830 ml   Filed Weights   03/02/17 1614 03/02/17 2016 03/03/17 0126  Weight: 109 lb (49.4 kg) 115 lb 12.8 oz (52.5 kg) 110 lb 8 oz (50.1 kg)   Body mass index is 20.88 kg/m.   Physical Exam: General: Frail and elderly appearing, in no acute distress. Head: Normocephalic, atraumatic, sclera non-icteric, no xanthomas, nares without discharge. HOH. Neck: Negative for carotid bruits. JVD not elevated. Lungs: Clear bilaterally to auscultation without wheezes, rales, or rhonchi. Breathing is unlabored. Heart: Bradycardic with S1 S2. No murmurs, rubs, or gallops appreciated. Abdomen: Soft, non-tender, non-distended with normoactive bowel sounds. No hepatomegaly. No rebound/guarding. No obvious abdominal masses. Msk:  Strength and tone appear normal for age. Extremities: No clubbing or cyanosis. No edema. Distal pedal pulses are 2+ and equal bilaterally. Neuro: Alert and  oriented X 3. No facial asymmetry. No focal deficit. Moves all extremities spontaneously. Psych:  Responds to questions appropriately with a normal affect.   EKG:  The EKG was personally reviewed and demonstrates: sinus bradycardia Telemetry:  Telemetry was personally reviewed and demonstrates: sinus bradycardia, 50s bpm  Weights: Filed Weights   03/02/17 1614 03/02/17 2016 03/03/17 0126  Weight: 109 lb (49.4 kg) 115 lb 12.8 oz (52.5 kg) 110 lb 8 oz (50.1 kg)    Relevant CV Studies: TTE 02/02/2017: Study Conclusions  - Left ventricle: The cavity size was normal. Wall thickness was   normal. Systolic function was normal. The estimated ejection   fraction was in the range of 55% to 60%. Wall motion was normal;   there were no regional wall motion abnormalities. - Aortic valve: There was mild regurgitation. - Mitral valve: There was mild regurgitation. - Left atrium: The atrium was mildly dilated. - Right ventricle: The cavity size was moderately dilated. Wall   thickness was increased. Systolic function was mildly  reduced. - Right atrium: The atrium was severely dilated. - Tricuspid valve: There was moderate regurgitation. - Pulmonary arteries: Systolic pressure was severely increased. PA   peak pressure: 65 mm Hg (S). - Inferior vena cava: The vessel was dilated. The respirophasic   diameter changes were blunted (< 50%), consistent with elevated   central venous pressure. - Pericardium, extracardiac: A trivial pericardial effusion was   identified posterior to the heart.  Laboratory Data:  Chemistry Recent Labs  Lab 03/02/17 1619 03/03/17 0435  NA 136 140  K 3.4* 3.3*  CL 100* 99*  CO2 29 33*  GLUCOSE 107* 98  BUN 15 13  CREATININE 1.09* 0.94  CALCIUM 9.2 8.6*  GFRNONAA 43* 52*  GFRAA 50* >60  ANIONGAP 7 8    No results for input(s): PROT, ALBUMIN, AST, ALT, ALKPHOS, BILITOT in the last 168 hours. Hematology Recent Labs  Lab 03/02/17 1619  WBC 6.3  RBC 4.06  HGB 13.0  HCT 38.9  MCV 95.6  MCH 32.0  MCHC 33.5  RDW 15.7*  PLT 193   Cardiac Enzymes Recent Labs  Lab 03/02/17 1619  TROPONINI 0.06*   No results for input(s): TROPIPOC in the last 168 hours.  BNP Recent Labs  Lab 03/02/17 1619  BNP 1,577.0*    DDimer No results for input(s): DDIMER in the last 168 hours.  Radiology/Studies:  Ct Angio Chest Pe W And/or Wo Contrast  Result Date: 03/02/2017 IMPRESSION: No pulmonary embolus noted. Emphysematous changes with superimposed mild pulmonary vascular congestion. Mild enlargement of bilateral pleural effusions which are slightly loculated and greater on the right. Basilar atelectasis greater on the right. Infectious infiltrate within the right lung base felt to be less likely consideration. Stable right upper lobe 4 mm nodule (series 6, image 38). No follow-up needed if patient is low-risk. Non-contrast chest CT can be considered in 12 months if patient is high-risk. This recommendation follows the consensus statement: Guidelines for Management of Incidental Pulmonary  Nodules Detected on CT Images: From the Fleischner Society 2017; Radiology 2017; 284:228-243. Cardiomegaly.  Coronary artery calcifications. Similar appearance of T8 compression fracture mild retropulsion. No significant change T3 and T4 superior endplate compression fracture. Aortic Atherosclerosis (ICD10-I70.0). Emphysema (ICD10-J43.9). Electronically Signed   By: Genia Del M.D.   On: 03/02/2017 17:53    Assessment and Plan:   1. Intermittent SOB: -It is somewhat difficult to actually see why she presented to the ED  -She notes intermittent SOB lasting ~  1 hour every morning that spontaneously resolves without intervention. This has been ongoing since her last admission and is unchanged -Reportedly, she was told  "I have fluid on my lungs" leading to her presentation, though she denies any symptoms of SOB -Her BNP is elevated -CXR was not done -CTA chest was done, assuming for PE evaluation though she is on Eliquis making PE much less likely. CTA chest did show COPD with mild congestion along with mild enlargement of bilateral pleural effusions with are slightly loculated -Suspected, if she was SOB, this is likely multifactorial with the above -Consider thoracentesis for loculated effusions, will defer to IM  2. PAF: -It is unclear where the rapid Afib with RVR occurred as the patient was in sinus bradycardia upon arrival to the ED via EMS as well as sinus bradycardia noted on 12-lead EKG performed in the ED and has been in sinus bradycardia her entire time on telemetry -Uncertain when she converted from Afib to sinus rhythm as she was noted to be in Afib on 02/06/2017 during recent admission -Decrease amiodarone to 200 mg daily -Decrease Lopressor to 12.5 mg bid -Eliquis 2.5 mg bid (meets reduced dosing criteria) -If bradycardic rates persist, may need further tapering of her amiodarone vs Lopressor  3. Chronic diastolic CHF/Pulmonary HTN: -Change IV Lasix to PO Lasix -Consider  pulmonary evaluation, defer to IM -Uncertain etiology of pulmonary HTN, needs outpatient follow up  4. COPD: -Documented she was never a smoker -Possible environmental exposure?, will defer further evaluation to IM  5. Pulmonary nodule: -Per IM  6. Elevated troponin: -Minimally elevated at 0.06, not trended upon admission -Likely supply demand ischemia 2/2 the above -Trend until peak -No indication for heparin gtt at this time unless there is dynamic troponin elevation -Recent echo as above, no indication to repeat  7. Hypokalemia/hypomagnesemia: -Recommend repletion to goal > 4.0 and > 2.0 respectively    For questions or updates, please contact [place] Please consult [email or web address] for contact info under Cardiology/STEMI.   Signed, Christell Faith, PA-C [place] Pager: [phone] 03/03/2017, 11:44 AM

## 2017-03-03 NOTE — Progress Notes (Signed)
[facility] [place] NAME: Krystal White    MR#:  [id number]  DATE OF BIRTH:  Apr 11, 1924  SUBJECTIVE:  CHIEF COMPLAINT:   Chief Complaint  Patient presents with  . Shortness of Breath   The patient still has shortness of breath, on oxygen 2 L, but hypoxia on exertion during PT evaluation, oxygen is increased to 5 L. REVIEW OF SYSTEMS:  Review of Systems  Constitutional: Positive for malaise/fatigue. Negative for chills and fever.  HENT: Positive for hearing loss. Negative for sore throat.   Eyes: Negative for blurred vision and double vision.  Respiratory: Positive for cough and shortness of breath. Negative for hemoptysis, sputum production, wheezing and stridor.   Cardiovascular: Positive for leg swelling. Negative for chest pain, palpitations and orthopnea.  Gastrointestinal: Negative for abdominal pain, blood in stool, diarrhea, melena, nausea and vomiting.  Genitourinary: Negative for dysuria, flank pain and hematuria.  Musculoskeletal: Negative for back pain and joint pain.  Skin: Negative for rash.  Neurological: Positive for weakness. Negative for dizziness, sensory change, focal weakness, seizures, loss of consciousness and headaches.  Endo/Heme/Allergies: Negative for polydipsia.  Psychiatric/Behavioral: Negative for depression. The patient is not nervous/anxious.     DRUG ALLERGIES:  No Known Allergies VITALS:  Blood pressure (!) 126/42, pulse 63, temperature 98.3 F (36.8 C), resp. rate 18, height 5\' 1"  (1.549 m), weight 110 lb 8 oz (50.1 kg), SpO2 94 %. PHYSICAL EXAMINATION:  Physical Exam  Constitutional: She is oriented to person, place, and time and well-developed, well-nourished, and in no distress.  HENT:  Head: Normocephalic.  Mouth/Throat: Oropharynx is clear and moist.  Eyes: Conjunctivae and EOM are normal. Pupils are equal, round, and reactive to light. No scleral icterus.  Neck: Normal range of motion.  Neck supple. No JVD present. No tracheal deviation present.  Cardiovascular: Normal rate, regular rhythm and normal heart sounds. Exam reveals no gallop.  No murmur heard. Pulmonary/Chest: Effort normal. No respiratory distress. She has no wheezes. She has rales.  Abdominal: Soft. Bowel sounds are normal. She exhibits no distension. There is no tenderness. There is no rebound.  Musculoskeletal: Normal range of motion. She exhibits no edema or tenderness.  Neurological: She is alert and oriented to person, place, and time. No cranial nerve deficit.  Skin: No rash noted. No erythema.  Psychiatric: Affect normal.   LABORATORY PANEL:  Female CBC Recent Labs  Lab 03/02/17 1619  WBC 6.3  HGB 13.0  HCT 38.9  PLT 193   ------------------------------------------------------------------------------------------------------------------ Chemistries  Recent Labs  Lab 03/03/17 0435  NA 140  K 3.3*  CL 99*  CO2 33*  GLUCOSE 98  BUN 13  CREATININE 0.94  CALCIUM 8.6*  MG 1.5*   RADIOLOGY:  Ct Angio Chest Pe W And/or Wo Contrast  Result Date: 03/02/2017 CLINICAL DATA:  84 year old female with low oxygen saturation. Shortness breath. History of congestive heart failure. Initial encounter. EXAM: CT ANGIOGRAPHY CHEST WITH CONTRAST TECHNIQUE: Multidetector CT imaging of the chest was performed using the standard protocol during bolus administration of intravenous contrast. Multiplanar CT image reconstructions and MIPs were obtained to evaluate the vascular anatomy. CONTRAST:  85mL ISOVUE-370 IOPAMIDOL (ISOVUE-370) INJECTION 76% COMPARISON:  02/03/2017 CT. FINDINGS: Cardiovascular:  No pulmonary embolus. Cardiomegaly.  Coronary artery calcification. Atherosclerotic changes thoracic aorta without aneurysm. Limited for evaluating for dissection. No obvious dissection noted. Calcified plaque origin great vessels. Mediastinum/Nodes: No mediastinal or hilar adenopathy. Collapsed esophagus appear slightly  thickened. Lungs/Pleura: Emphysematous changes  with superimposed mild pulmonary vascular congestion. Slight enlargement of bilateral pleural effusions which are slightly loculated and greater on the right. Basilar atelectasis greater on the right. Infectious infiltrate within the right lung base felt to be less likely consideration. Stable right upper lobe 4 mm nodule (series 6, image 38). Scarring lung apices stable. Upper Abdomen: Left lobe liver 2.3 cm cyst suspected without change. Tiny right lobe liver hypodensities too small to characterize, without change and may represent cysts. Left renal 9 mm hyperdense lesion probably a hyperdense cyst. Hyperplasia adrenal glands. Atherosclerotic changes of of abdominal aorta and origin of the celiac/ superior mesenteric artery. Musculoskeletal: Compression fracture T8 with anterior wedge compression deformity with 90% loss of height centrally and slight retropulsion posterior inferior aspect contacting the ventral cord unchanged. Remote T3 and T4 superior endplate compression fracture. Review of the MIP images confirms the above findings. IMPRESSION: No pulmonary embolus noted. Emphysematous changes with superimposed mild pulmonary vascular congestion. Mild enlargement of bilateral pleural effusions which are slightly loculated and greater on the right. Basilar atelectasis greater on the right. Infectious infiltrate within the right lung base felt to be less likely consideration. Stable right upper lobe 4 mm nodule (series 6, image 38). No follow-up needed if patient is low-risk. Non-contrast chest CT can be considered in 12 months if patient is high-risk. This recommendation follows the consensus statement: Guidelines for Management of Incidental Pulmonary Nodules Detected on CT Images: From the Fleischner Society 2017; Radiology 2017; 284:228-243. Cardiomegaly.  Coronary artery calcifications. Similar appearance of T8 compression fracture mild retropulsion. No  significant change T3 and T4 superior endplate compression fracture. Aortic Atherosclerosis (ICD10-I70.0). Emphysema (ICD10-J43.9). Electronically Signed   By: Genia Del M.D.   On: 03/02/2017 17:53   ASSESSMENT AND PLAN:   Krystal White  is a 84 y.o. female with a known history of congestive heart failure diastolic, hypertension, chronic A. fib on oral anticoagulation comes to the emergency room from home after she started having increasing shortness of breath.  Her physical therapist came to evaluate her told her that she had abnormal vital signs needed to go to the hospital.  Patient sats were 82% on room air.  She is 98% on 2 L nasal cannula oxygen.  She has elevated BNP and her chest x-ray is consistent with congestive heart failure.   1.  Acute on chronic diastolic congestive heart failure, LV EF: 55% -   60% On IV Lasix 20 twice daily, Change IV Lasix to PO Lasix -Monitor I's and O's, creatinine  Acute respiratory failure with hypoxia due to CHF. -Wean oxygen as needed.  Patient does not use outpatient oxygen  2.  Proximal A. fib with RVR--- rate controlled Per cardiology consult, decrease amiodarone to 200 mg daily and Lopressor to 12.5 mg bid, Eliquis 2.5 mg bid  3. Hypokalemia.  Given potassium supplement and follow-up BMP. Hypomagnesemia.  Given IV magnesium in the follow-up level.  4.  Elevated troponin.  Due to demanding ischemia.  The patient is on Eliquis.  Generalized weakness.  PT evaluation. All the records are reviewed and case discussed with Care Management/Social Worker. Management plans discussed with the patient, family and they are in agreement.  CODE STATUS: DNR  TOTAL TIME TAKING CARE OF THIS PATIENT: 38 minutes.   More than 50% of the time was spent in counseling/coordination of care: YES  POSSIBLE D/C IN 3 DAYS, DEPENDING ON CLINICAL CONDITION.   Demetrios Loll M.D on 03/03/2017 at 1:03 PM  Between 7am to  6pm - Pager - [phone]  After 6pm go to  [email or web address] - [facility]

## 2017-03-03 NOTE — Progress Notes (Signed)
RN called to room by PT, while working with PT pt experienced loss of IV site, PT also informed this RN that patient O2 saturations had decreased to 81-82% on 2 L nasal cannula, increased O2 to 5 liters before pt showed any improvement, pt oxygen saturations are now 94% on 5 L O2 via [place], she is however not complaining of any SOB and is able to speak in full sentences.

## 2017-03-03 NOTE — Plan of Care (Signed)
  Education: Knowledge of General Education information will improve 03/03/2017 1825 - Progressing by Alen Blew, RN   Safety: Ability to remain free from injury will improve 03/03/2017 1825 - Progressing by Alen Blew, RN   Clinical Measurements: Respiratory complications will improve 03/03/2017 1825 - Not Progressing by Alen Blew, RN  Oxygen saturations decreasing with activity from bed to chair and from bed to toilet, oxygen saturations do recover, this pt is an acute oxygen at this time Clinical Measurements: Ability to maintain clinical measurements within normal limits will improve 03/03/2017 1825 - Not Progressing by Roanna Epley D, RN   Activity: Risk for activity intolerance will decrease 03/03/2017 1825 - Not Progressing by Alen Blew, RN

## 2017-03-04 DIAGNOSIS — R0603 Acute respiratory distress: Secondary | ICD-10-CM

## 2017-03-04 LAB — BASIC METABOLIC PANEL
Anion gap: 7 (ref 5–15)
BUN: 14 mg/dL (ref 6–20)
CHLORIDE: 96 mmol/L — AB (ref 101–111)
CO2: 34 mmol/L — ABNORMAL HIGH (ref 22–32)
CREATININE: 0.82 mg/dL (ref 0.44–1.00)
Calcium: 8.3 mg/dL — ABNORMAL LOW (ref 8.9–10.3)
Glucose, Bld: 100 mg/dL — ABNORMAL HIGH (ref 65–99)
Potassium: 3.6 mmol/L (ref 3.5–5.1)
SODIUM: 137 mmol/L (ref 135–145)

## 2017-03-04 LAB — MAGNESIUM: Magnesium: 1.9 mg/dL (ref 1.7–2.4)

## 2017-03-04 MED ORDER — DEXTROMETHORPHAN POLISTIREX ER 30 MG/5ML PO SUER
30.0000 mg | Freq: Two times a day (BID) | ORAL | Status: DC
  Administered 2017-03-04 – 2017-03-09 (×11): 30 mg via ORAL
  Filled 2017-03-04 (×11): qty 5

## 2017-03-04 MED ORDER — POTASSIUM CHLORIDE CRYS ER 20 MEQ PO TBCR
40.0000 meq | EXTENDED_RELEASE_TABLET | Freq: Once | ORAL | Status: AC
  Administered 2017-03-04: 40 meq via ORAL
  Filled 2017-03-04: qty 2

## 2017-03-04 MED ORDER — GUAIFENESIN ER 600 MG PO TB12
600.0000 mg | ORAL_TABLET | Freq: Two times a day (BID) | ORAL | Status: DC
  Administered 2017-03-04 – 2017-03-09 (×11): 600 mg via ORAL
  Filled 2017-03-04 (×12): qty 1

## 2017-03-04 MED ORDER — DM-GUAIFENESIN ER 30-600 MG PO TB12: 1.0000 | ORAL_TABLET | Freq: Two times a day (BID) | ORAL | Status: DC

## 2017-03-04 MED ORDER — GUAIFENESIN 100 MG/5ML PO SOLN
5.0000 mL | ORAL | Status: DC | PRN
  Filled 2017-03-04: qty 5

## 2017-03-04 MED ORDER — BLISTEX MEDICATED EX OINT
TOPICAL_OINTMENT | CUTANEOUS | Status: DC | PRN
  Administered 2017-03-05: 04:00:00 via TOPICAL
  Filled 2017-03-04: qty 6.3

## 2017-03-04 NOTE — Progress Notes (Signed)
Progress Note  Patient Name: Krystal White Date of Encounter: 03/04/2017  Primary Cardiologist: Fletcher Anon  Subjective   No complaints this morning. Denies having episode of her typical 1 hour of SOB this morning. Remains in sinus rhythm with heart rate now in the 70s bpm with tapering of Lopressor and amiodarone. BP in the low 500X systolic. O2 sats in the mid 80s on 2 L via nasal cannula, improved to the mid 90s on 3 L via nasal cannula.   Inpatient Medications    Scheduled Meds: . amiodarone  200 mg Oral Daily  . apixaban  2.5 mg Oral BID  . furosemide  40 mg Oral Daily  . mouth rinse  15 mL Mouth Rinse BID  . metoprolol tartrate  12.5 mg Oral BID  . sodium chloride flush  3 mL Intravenous Q12H   Continuous Infusions:  PRN Meds: acetaminophen **OR** acetaminophen, albuterol, fluticasone, polyethylene glycol   Vital Signs    Vitals:   03/03/17 2001 03/04/17 0347 03/04/17 0856 03/04/17 0905  BP: 108/68 (!) 134/43 (!) 109/34   Pulse: 62 64 73 76  Resp: 18 16 18    Temp: 98.6 F (37 C) 98.3 F (36.8 C) 98.3 F (36.8 C)   TempSrc: Oral Oral    SpO2: 97% 92% (!) 85% 93%  Weight:  105 lb 12.8 oz (48 kg)    Height:        Intake/Output Summary (Last 24 hours) at 03/04/2017 1012 Last data filed at 03/04/2017 3818 Gross per 24 hour  Intake 600 ml  Output 1701 ml  Net -1101 ml   Filed Weights   03/02/17 2016 03/03/17 0126 03/04/17 0347  Weight: 115 lb 12.8 oz (52.5 kg) 110 lb 8 oz (50.1 kg) 105 lb 12.8 oz (48 kg)    Telemetry    Sinus rhythm, 70s bpm - Personally Reviewed  ECG    n/a - Personally Reviewed  Physical Exam   GEN: Frail and elderly appearing; No acute distress. HOH. Neck: No JVD. Cardiac: RRR, II/VI systolic murmur at the apex, no rubs, or gallops.  Respiratory: Diminished breath sounds bilateral bases.  GI: Soft, nontender, non-distended.   MS: No edema; No deformity. Neuro:  Alert and oriented x 3; Nonfocal.  Psych: Normal affect.  Labs      Chemistry Recent Labs  Lab 03/02/17 1619 03/03/17 0435 03/04/17 0333  NA 136 140 137  K 3.4* 3.3* 3.6  CL 100* 99* 96*  CO2 29 33* 34*  GLUCOSE 107* 98 100*  BUN 15 13 14   CREATININE 1.09* 0.94 0.82  CALCIUM 9.2 8.6* 8.3*  GFRNONAA 43* 52* >60  GFRAA 50* >60 >60  ANIONGAP 7 8 7      Hematology Recent Labs  Lab 03/02/17 1619  WBC 6.3  RBC 4.06  HGB 13.0  HCT 38.9  MCV 95.6  MCH 32.0  MCHC 33.5  RDW 15.7*  PLT 193    Cardiac Enzymes Recent Labs  Lab 03/02/17 1619  TROPONINI 0.06*   No results for input(s): TROPIPOC in the last 168 hours.   BNP Recent Labs  Lab 03/02/17 1619  BNP 1,577.0*     DDimer No results for input(s): DDIMER in the last 168 hours.   Radiology    Ct Angio Chest Pe W And/or Wo Contrast  Result Date: 03/02/2017 IMPRESSION: No pulmonary embolus noted. Emphysematous changes with superimposed mild pulmonary vascular congestion. Mild enlargement of bilateral pleural effusions which are slightly loculated and greater on the  right. Basilar atelectasis greater on the right. Infectious infiltrate within the right lung base felt to be less likely consideration. Stable right upper lobe 4 mm nodule (series 6, image 38). No follow-up needed if patient is low-risk. Non-contrast chest CT can be considered in 12 months if patient is high-risk. This recommendation follows the consensus statement: Guidelines for Management of Incidental Pulmonary Nodules Detected on CT Images: From the Fleischner Society 2017; Radiology 2017; 284:228-243. Cardiomegaly.  Coronary artery calcifications. Similar appearance of T8 compression fracture mild retropulsion. No significant change T3 and T4 superior endplate compression fracture. Aortic Atherosclerosis (ICD10-I70.0). Emphysema (ICD10-J43.9). Electronically Signed   By: Genia Del M.D.   On: 03/02/2017 17:53    Cardiac Studies   TTE 02/02/2017: Study Conclusions  - Left ventricle: The cavity size was normal.  Wall thickness was   normal. Systolic function was normal. The estimated ejection   fraction was in the range of 55% to 60%. Wall motion was normal;   there were no regional wall motion abnormalities. - Aortic valve: There was mild regurgitation. - Mitral valve: There was mild regurgitation. - Left atrium: The atrium was mildly dilated. - Right ventricle: The cavity size was moderately dilated. Wall   thickness was increased. Systolic function was mildly reduced. - Right atrium: The atrium was severely dilated. - Tricuspid valve: There was moderate regurgitation. - Pulmonary arteries: Systolic pressure was severely increased. PA   peak pressure: 65 mm Hg (S). - Inferior vena cava: The vessel was dilated. The respirophasic   diameter changes were blunted (< 50%), consistent with elevated   central venous pressure. - Pericardium, extracardiac: A trivial pericardial effusion was   identified posterior to the heart.  Patient Profile     84 y.o. female with history of PAF on Elquis, chronic diastolic CHF/pulmonary HTN, HTN, osteoporosis, and HOH who is being seen today for the evaluation of intermittent SOB only in the morning, lasting ~ 1 hour.  Assessment & Plan    1. Acute respiratory distress with hypoxia/loculated pleural effusions: -Likely multifactorial including AECOPD and pleural effusions -Remains on supplemental oxygen, now at 3 L via nasal cannula to maintain saturations in the mid 90s  -Consider therapeutic thoracentesis given her persistent hypoxia with known loculated effusions on CTA chest, defer to IM -Continue PO Lasix 40 mg daily -Supportive care  2. PAF: -No evidence of Afib this admission -heart rates have improved from the 50s bpm to 70s bpm with tapering of Lopressor to 12.5 mg bid and amiodarone to 200 mg daily -Eliquis 2.5 mg bid (meets reduced dosing criteria)  3. Chronic diastolic CHF/pulmonary HTN: -PO Lasix as above  4. COPD: -Documented she was never  a smoker -Possible environmental exposure?, will defer further evaluation to IM  5. Pulmonary nodule: -Per IM  6. Elevated troponin: -Minimally elevated at 0.06, not trended upon admission -Likely supply demand ischemia 2/2 the above -No indication for heparin gtt at this time  -Recent echo as above, no indication to repeat  7. Hypokalemia/hypomagnesemia: -Improving -Recommend repletion to goal > 4.0 and > 2.0 respectively     For questions or updates, please contact [place] Please consult [email or web address] for contact info under Cardiology/STEMI.    Signed, Christell Faith, PA-C [facility] Pager: [phone] 03/04/2017, 10:12 AM

## 2017-03-04 NOTE — Care Management (Signed)
Patient is requiring supplemental oxygen which is acute.  Discussed during progression the need to perform home oxygen assessment.

## 2017-03-04 NOTE — Progress Notes (Addendum)
At 0900, her Oxygen sats were 85% on 2LNC.  Increased her flor to 3L and her sats increased to 93%.  She told her her breathing is always bad in the morning and improves by lunch.  Now sats are 99% on 3LNC.Marland Kitchen  Slowly decreasing he oxygen flow.  15:12.  O2 sats dropped to 88% on room air.  Returned her to [facility], sats increased to 93%

## 2017-03-04 NOTE — Progress Notes (Signed)
[place] at [place] NAME: Krystal White    MR#:  [id number]  DATE OF BIRTH:  10-19-1924  SUBJECTIVE:  CHIEF COMPLAINT:   Chief Complaint  Patient presents with  . Shortness of Breath   The patient has better shortness of breath, on oxygen 3 L.  Hypoxia at 85% on 2 L oxygen REVIEW OF SYSTEMS:  Review of Systems  Constitutional: Positive for malaise/fatigue. Negative for chills and fever.  HENT: Positive for hearing loss. Negative for sore throat.   Eyes: Negative for blurred vision and double vision.  Respiratory: Positive for cough and shortness of breath. Negative for hemoptysis, sputum production, wheezing and stridor.   Cardiovascular: Positive for leg swelling. Negative for chest pain, palpitations and orthopnea.  Gastrointestinal: Negative for abdominal pain, blood in stool, diarrhea, melena, nausea and vomiting.  Genitourinary: Negative for dysuria, flank pain and hematuria.  Musculoskeletal: Negative for back pain and joint pain.  Skin: Negative for rash.  Neurological: Positive for weakness. Negative for dizziness, sensory change, focal weakness, seizures, loss of consciousness and headaches.  Endo/Heme/Allergies: Negative for polydipsia.  Psychiatric/Behavioral: Negative for depression. The patient is not nervous/anxious.     DRUG ALLERGIES:  No Known Allergies VITALS:  Blood pressure (!) 109/34, pulse 76, temperature 98.3 F (36.8 C), resp. rate 18, height 5\' 1"  (1.549 m), weight 105 lb 12.8 oz (48 kg), SpO2 93 %. PHYSICAL EXAMINATION:  Physical Exam  Constitutional: She is oriented to person, place, and time and well-developed, well-nourished, and in no distress.  HENT:  Head: Normocephalic.  Mouth/Throat: Oropharynx is clear and moist.  Eyes: Conjunctivae and EOM are normal. Pupils are equal, round, and reactive to light. No scleral icterus.  Neck: Normal range of motion. Neck supple. No JVD present. No tracheal  deviation present.  Cardiovascular: Normal rate, regular rhythm and normal heart sounds. Exam reveals no gallop.  No murmur heard. Pulmonary/Chest: Effort normal. No respiratory distress. She has no wheezes. She has rales.  Abdominal: Soft. Bowel sounds are normal. She exhibits no distension. There is no tenderness. There is no rebound.  Musculoskeletal: Normal range of motion. She exhibits no edema or tenderness.  Neurological: She is alert and oriented to person, place, and time. No cranial nerve deficit.  Skin: No rash noted. No erythema.  Psychiatric: Affect normal.   LABORATORY PANEL:  Female CBC Recent Labs  Lab 03/02/17 1619  WBC 6.3  HGB 13.0  HCT 38.9  PLT 193   ------------------------------------------------------------------------------------------------------------------ Chemistries  Recent Labs  Lab 03/04/17 0333  NA 137  K 3.6  CL 96*  CO2 34*  GLUCOSE 100*  BUN 14  CREATININE 0.82  CALCIUM 8.3*  MG 1.9   RADIOLOGY:  No results found. ASSESSMENT AND PLAN:   Krystal White  is a 84 y.o. female with a known history of congestive heart failure diastolic, hypertension, chronic A. fib on oral anticoagulation comes to the emergency room from home after she started having increasing shortness of breath.  Her physical therapist came to evaluate her told her that she had abnormal vital signs needed to go to the hospital.  Patient sats were 82% on room air.  She is 98% on 2 L nasal cannula oxygen.  She has elevated BNP and her chest x-ray is consistent with congestive heart failure.   1.  Acute on chronic diastolic congestive heart failure, LV EF: 55% -   60% On IV Lasix 20 twice daily, Changed IV Lasix to PO  40 mg Lasix -Monitor I's and O's, creatinine  Pleural effusion, possible due to above. Thoracentesis is suggested by cardiologist.  But the patient has high risk of pneumothorax due to emphysema.  Continue medical treatment and pulmonary consult.  Acute  respiratory failure with hypoxia due to CHF. -Wean oxygen as needed.  Patient does not use outpatient oxygen, NEB prn.  2.  Proximal A. fib with RVR--- rate controlled Per cardiology consult, decreased amiodarone to 200 mg daily and Lopressor to 12.5 mg bid, Eliquis 2.5 mg bid.  Continue current treatment per Dr. Saunders Revel.  3. Hypokalemia.  Given potassium supplement and improved. Hypomagnesemia.  Improved with IV magnesium.  4.  Elevated troponin.  Due to demanding ischemia.  The patient is on Eliquis.  Generalized weakness.  PT evaluation: HHPT. All the records are reviewed and case discussed with Care Management/Social Worker. Management plans discussed with the patient, family and they are in agreement.  CODE STATUS: DNR  TOTAL TIME TAKING CARE OF THIS PATIENT: 36 minutes.   More than 50% of the time was spent in counseling/coordination of care: YES  POSSIBLE D/C IN 2-3 DAYS, DEPENDING ON CLINICAL CONDITION.   Demetrios Loll M.D on 03/04/2017 at 1:34 PM  Between 7am to 6pm - Pager - [phone]  After 6pm go to [email or web address] - [facility]

## 2017-03-05 DIAGNOSIS — R0600 Dyspnea, unspecified: Secondary | ICD-10-CM

## 2017-03-05 LAB — BASIC METABOLIC PANEL
Anion gap: 8 (ref 5–15)
BUN: 18 mg/dL (ref 6–20)
CO2: 31 mmol/L (ref 22–32)
Calcium: 8.6 mg/dL — ABNORMAL LOW (ref 8.9–10.3)
Chloride: 96 mmol/L — ABNORMAL LOW (ref 101–111)
Creatinine, Ser: 0.97 mg/dL (ref 0.44–1.00)
GFR calc Af Amer: 58 mL/min — ABNORMAL LOW (ref >60)
GFR, EST NON AFRICAN AMERICAN: 50 mL/min — AB (ref >60)
Glucose, Bld: 102 mg/dL — ABNORMAL HIGH (ref 65–99)
POTASSIUM: 4.4 mmol/L (ref 3.5–5.1)
SODIUM: 135 mmol/L (ref 135–145)

## 2017-03-05 LAB — MAGNESIUM: MAGNESIUM: 1.9 mg/dL (ref 1.7–2.4)

## 2017-03-05 NOTE — Progress Notes (Signed)
Progress Note  Patient Name: Krystal White Date of Encounter: 03/05/2017  Primary Cardiologist: Kathlyn Sacramento, MD  Subjective   Feels better this am.  Breathing stable.  Hopeful to go home.  Inpatient Medications    Scheduled Meds: . amiodarone  200 mg Oral Daily  . apixaban  2.5 mg Oral BID  . dextromethorphan  30 mg Oral BID   And  . guaiFENesin  600 mg Oral BID  . furosemide  40 mg Oral Daily  . mouth rinse  15 mL Mouth Rinse BID  . metoprolol tartrate  12.5 mg Oral BID  . sodium chloride flush  3 mL Intravenous Q12H   Continuous Infusions:  PRN Meds: acetaminophen **OR** acetaminophen, albuterol, fluticasone, guaiFENesin, lip balm, polyethylene glycol   Vital Signs    Vitals:   03/04/17 1601 03/04/17 1952 03/05/17 0405 03/05/17 0749  BP: (!) 139/44 (!) 129/46 (!) 145/45 (!) 148/51  Pulse: (!) 59 66 (!) 59 70  Resp: 18 17 (!) 22 18  Temp: 98.2 F (36.8 C) 98.5 F (36.9 C) 98.1 F (36.7 C) 98.1 F (36.7 C)  TempSrc: Oral Oral Oral Oral  SpO2: 95% 93% 94% 94%  Weight:   108 lb 9.6 oz (49.3 kg)   Height:        Intake/Output Summary (Last 24 hours) at 03/05/2017 1120 Last data filed at 03/05/2017 1018 Gross per 24 hour  Intake 960 ml  Output 1100 ml  Net -140 ml   Filed Weights   03/03/17 0126 03/04/17 0347 03/05/17 0405  Weight: 110 lb 8 oz (50.1 kg) 105 lb 12.8 oz (48 kg) 108 lb 9.6 oz (49.3 kg)    Physical Exam   GEN: Well nourished, well developed, in no acute distress.  HEENT: Grossly normal.  Neck: Supple, no JVD, carotid bruits, or masses. Cardiac: RRR, no murmurs, rubs, or gallops. No clubbing, cyanosis, edema.  Radials/DP/PT 2+ and equal bilaterally.  Respiratory:  Respirations regular and unlabored, diminished breath sounds ~ 1/3 way up on right with crackles just above that.  Left basilar crackles. GI: Soft, nontender, nondistended, BS + x 4. MS: no deformity or atrophy. Skin: warm and dry, no rash. Neuro:  Strength and sensation  are intact. Psych: AAOx3.  Normal affect.  Labs    Chemistry Recent Labs  Lab 03/03/17 0435 03/04/17 0333 03/05/17 0418  NA 140 137 135  K 3.3* 3.6 4.4  CL 99* 96* 96*  CO2 33* 34* 31  GLUCOSE 98 100* 102*  BUN 13 14 18   CREATININE 0.94 0.82 0.97  CALCIUM 8.6* 8.3* 8.6*  GFRNONAA 52* >60 50*  GFRAA >60 >60 58*  ANIONGAP 8 7 8      Hematology Recent Labs  Lab 03/02/17 1619  WBC 6.3  RBC 4.06  HGB 13.0  HCT 38.9  MCV 95.6  MCH 32.0  MCHC 33.5  RDW 15.7*  PLT 193    Cardiac Enzymes Recent Labs  Lab 03/02/17 1619  TROPONINI 0.06*      BNP Recent Labs  Lab 03/02/17 1619  BNP 1,577.0*      Radiology    No results found.  Telemetry    Sinus rhythm, occas pac's. - Personally Reviewed  Cardiac Studies   TTE 02/02/2017: Study Conclusions   - Left ventricle: The cavity size was normal. Wall thickness was   normal. Systolic function was normal. The estimated ejection   fraction was in the range of 55% to 60%. Wall motion was normal;  there were no regional wall motion abnormalities. - Aortic valve: There was mild regurgitation. - Mitral valve: There was mild regurgitation. - Left atrium: The atrium was mildly dilated. - Right ventricle: The cavity size was moderately dilated. Wall   thickness was increased. Systolic function was mildly reduced. - Right atrium: The atrium was severely dilated. - Tricuspid valve: There was moderate regurgitation. - Pulmonary arteries: Systolic pressure was severely increased. PA   peak pressure: 65 mm Hg (S). - Inferior vena cava: The vessel was dilated. The respirophasic   diameter changes were blunted (< 50%), consistent with elevated   central venous pressure. - Pericardium, extracardiac: A trivial pericardial effusion was   identified posterior to the heart.   Patient Profile   84 y.o. female with history of PAF on Elquis, chronic diastolic CHF/pulmonary HTN, HTN, osteoporosis, and HOH, admitted with  dyspnea and loculated pl effusions.  Assessment & Plan    1.  Acute respiratory distress w/ hypoxia:  Likely multifactorial in the setting of diast chf w/ mild pulm vasc congestion and bilat Pl effusions - R>L.  Improved with increasing oral lasix dose.  Seen by pulm  no plan for thoracentesis.  Plan to f/u R effusion w/ outpt cxr.  2.  PAF: Dx in 01/2017.  In sinus on admission.  No AF on tele. Cont amio,  blocker, eliquis (reduced dose 2/2 age/wt).  Will need outpt f/u of LFTs, PFTs, TFTs, and annual eye exams on amio.  3.  HFpEF: EF 55-60% by echo 01/2017.  BNP elevated on admission.  Minus 2.9L since admission on oral lasix and breathing improved. Renal fxn stable. HR/BP reasonable. Cont lasix 40 daily @ d/c (was on 20 daily prev). Has CHF clinic f/u on 1/14.  4.  Elevated troponin:  0.06 on admission.  Not trended.  No chest pain.  Likely represents demand ischemia in setting of resp failure.  No plans for ischemic eval.  5.  RUL nodule:  Stable @ 35mm on 03/02/17 CTA.  F/u in 1 yr.  Signed, Murray Hodgkins, NP  03/05/2017, 11:20 AM    For questions or updates, please contact   Please consult [email or web address] for contact info under Cardiology/STEMI.

## 2017-03-05 NOTE — Care Management (Signed)
Has qualified for home 02 if discharges within the next 48 hours

## 2017-03-05 NOTE — Plan of Care (Signed)
  Progressing Education: Knowledge of General Education information will improve 03/05/2017 1205 - Progressing by Darrelyn Hillock, RN Health Behavior/Discharge Planning: Ability to manage health-related needs will improve 03/05/2017 1205 - Progressing by Darrelyn Hillock, RN Clinical Measurements: Ability to maintain clinical measurements within normal limits will improve 03/05/2017 1205 - Progressing by Darrelyn Hillock, RN Will remain free from infection 03/05/2017 1205 - Progressing by Darrelyn Hillock, RN Diagnostic test results will improve 03/05/2017 1205 - Progressing by Darrelyn Hillock, RN Respiratory complications will improve 03/05/2017 1205 - Progressing by Darrelyn Hillock, RN Cardiovascular complication will be avoided 03/05/2017 1205 - Progressing by Darrelyn Hillock, RN Activity: Risk for activity intolerance will decrease 03/05/2017 1205 - Progressing by Darrelyn Hillock, RN Nutrition: Adequate nutrition will be maintained 03/05/2017 1205 - Progressing by Darrelyn Hillock, RN Coping: Level of anxiety will decrease 03/05/2017 1205 - Progressing by Darrelyn Hillock, RN Elimination: Will not experience complications related to bowel motility 03/05/2017 1205 - Progressing by Darrelyn Hillock, RN Will not experience complications related to urinary retention 03/05/2017 1205 - Progressing by Darrelyn Hillock, RN Pain Managment: General experience of comfort will improve 03/05/2017 1205 - Progressing by Darrelyn Hillock, RN Safety: Ability to remain free from injury will improve 03/05/2017 1205 - Progressing by Darrelyn Hillock, RN Skin Integrity: Risk for impaired skin integrity will decrease 03/05/2017 1205 - Progressing by Darrelyn Hillock, RN Cardiac: Ability to achieve and maintain adequate cardiopulmonary perfusion will improve 03/05/2017 1205 -  Progressing by Darrelyn Hillock, RN

## 2017-03-05 NOTE — Progress Notes (Signed)
SATURATION QUALIFICATIONS: (This note is used to comply with regulatory documentation for home oxygen)  Patient Saturations on Room Air at Rest =  88%  Patient Saturations on Room Air while Ambulating =88%  2 Liters of oxygen while Ambulating = 92%  Please briefly explain why patient needs home oxygen:

## 2017-03-05 NOTE — Progress Notes (Signed)
[facility] [place] NAME: Chandrika Sandles    MR#:  [id number]  DATE OF BIRTH:  Jul 15, 1924  SUBJECTIVE:  CHIEF COMPLAINT:   Chief Complaint  Patient presents with  . Shortness of Breath    REVIEW OF SYSTEMS:  CONSTITUTIONAL: No fever, fatigue or weakness.  EYES: No blurred or double vision.  EARS, NOSE, AND THROAT: No tinnitus or ear pain.  RESPIRATORY: No cough, shortness of breath, wheezing or hemoptysis.  CARDIOVASCULAR: No chest pain, orthopnea, edema.  GASTROINTESTINAL: No nausea, vomiting, diarrhea or abdominal pain.  GENITOURINARY: No dysuria, hematuria.  ENDOCRINE: No polyuria, nocturia,  HEMATOLOGY: No anemia, easy bruising or bleeding SKIN: No rash or lesion. MUSCULOSKELETAL: No joint pain or arthritis.   NEUROLOGIC: No tingling, numbness, weakness.  PSYCHIATRY: No anxiety or depression.   ROS  DRUG ALLERGIES:  No Known Allergies  VITALS:  Blood pressure (!) 134/42, pulse (!) 57, temperature 98.2 F (36.8 C), temperature source Oral, resp. rate 18, height 5\' 1"  (1.549 m), weight 49.3 kg (108 lb 9.6 oz), SpO2 95 %.  PHYSICAL EXAMINATION:  GENERAL:  84 y.o.-year-old patient lying in the bed with no acute distress.  EYES: Pupils equal, round, reactive to light and accommodation. No scleral icterus. Extraocular muscles intact.  HEENT: Head atraumatic, normocephalic. Oropharynx and nasopharynx clear.  NECK:  Supple, no jugular venous distention. No thyroid enlargement, no tenderness.  LUNGS: Normal breath sounds bilaterally, no wheezing, rales,rhonchi or crepitation. No use of accessory muscles of respiration.  CARDIOVASCULAR: S1, S2 normal. No murmurs, rubs, or gallops.  ABDOMEN: Soft, nontender, nondistended. Bowel sounds present. No organomegaly or mass.  EXTREMITIES: No pedal edema, cyanosis, or clubbing.  NEUROLOGIC: Cranial nerves II through XII are intact. Muscle strength 5/5 in all extremities. Sensation intact. Gait  not checked.  PSYCHIATRIC: The patient is alert and oriented x 3.  SKIN: No obvious rash, lesion, or ulcer.   Physical Exam LABORATORY PANEL:   CBC Recent Labs  Lab 03/02/17 1619  WBC 6.3  HGB 13.0  HCT 38.9  PLT 193   ------------------------------------------------------------------------------------------------------------------  Chemistries  Recent Labs  Lab 03/05/17 0418  NA 135  K 4.4  CL 96*  CO2 31  GLUCOSE 102*  BUN 18  CREATININE 0.97  CALCIUM 8.6*  MG 1.9   ------------------------------------------------------------------------------------------------------------------  Cardiac Enzymes Recent Labs  Lab 03/02/17 1619  TROPONINI 0.06*   ------------------------------------------------------------------------------------------------------------------  RADIOLOGY:  No results found.  ASSESSMENT AND PLAN:   [name] a89 y.o.femalewith a known history of congestive heart failure diastolic, hypertension, chronic A. fib on oral anticoagulation comes to the emergency room from home after she started having increasing shortness of breath.Her physical therapist came to evaluate her told her that she had abnormal vital signs needed to go to the hospital. Patient sats were 82% on room air. She is 98% on 2 L nasal cannula oxygen. She has elevated BNP and her chest x-ray is consistent with congestive heart failure.   1. Acute on chronic diastolic congestive heart failure, LV EF: 55% - 60% Resolving Continue IV Lasix, supplemental oxygen as needed, strict I&O monitoring, daily weights, BMP in the morning  2. Pleural effusion, possible due to above Critical care attending input greatly appreciated-no role for thoracentesis at this time Continue diuretic therapy and supplemental oxygen as needed-may require home O2  3. Acute respiratory failure with hypoxia due to CHF Resolving Plan of care as stated above  4.  Proximal A. fib with  RVR Cardiology input  appreciated- decreased amiodarone to 200 mg daily, Lopressor to 12.5 mg bid, Eliquis 2.5 mg bid as per Dr. Saunders Revel.  5. Hypokalemia Repleted  6. Elevated troponin Most likely due to congestive heart failure/demand ischemia Continue Eliquis.  Condition stable Prognosis poor DVT prophylaxis-on Eliquis Disposition home with home health services/PT in 1-2 days, may require home oxygen   All the records are reviewed and case discussed with Care Management/Social Workerr. Management plans discussed with the patient, family and they are in agreement.  TOTAL TIME TAKING CARE OF THIS PATIENT: 40 minutes.     POSSIBLE D/C IN 1-3 DAYS, DEPENDING ON CLINICAL CONDITION.   Avel Peace Latrice Storlie M.D on 03/05/2017   Between 7am to 6pm - Pager - [phone]  After 6pm go to [email or web address] - password [email or web address] [facility]  Office  [phone]  CC: Primary care physician; Derinda Late, MD  Note: This dictation was prepared with Dragon dictation along with smaller phrase technology. Any transcriptional errors that result from this process are unintentional.

## 2017-03-05 NOTE — Progress Notes (Signed)
Physical Therapy Treatment Patient Details Name: Krystal White MRN: [id number] DOB: 07-15-1924 Today's Date: 03/05/2017    History of Present Illness Pt admitted for CHF. Of note, negative for PE although positive for R upper lobe nodule. History includes CHF, HTN, and chronic A fib. Pt complains of SOB symptoms, currently on 2L of O2.    PT Comments    Pt agreeable to PT for bed exercises only. Denies pain. Pt notes she "just doesn't feel like doing much" despite encouragement. Pt does participate in supine bed exercises well with assist as needed. Pt given written exercise program and encouraged to perform several times a day. Continue PT to progress strength, endurance and out of bed tolerance to improve all functional mobility.    Follow Up Recommendations  Home health PT;Supervision for mobility/OOB     Equipment Recommendations  None recommended by PT    Recommendations for Other Services       Precautions / Restrictions Precautions Precautions: Fall Restrictions Weight Bearing Restrictions: No    Mobility  Bed Mobility               General bed mobility comments: Not tested; pt refuses out of bed  Transfers                    Ambulation/Gait                 Stairs            Wheelchair Mobility    Modified Rankin (Stroke Patients Only)       Balance                                            Cognition Arousal/Alertness: Awake/alert Behavior During Therapy: WFL for tasks assessed/performed Overall Cognitive Status: Within Functional Limits for tasks assessed                                        Exercises General Exercises - Lower Extremity Ankle Circles/Pumps: AROM;Both;20 reps;Supine Quad Sets: Strengthening;Both;10 reps;Supine Gluteal Sets: Strengthening;Both;10 reps;Supine Short Arc Quad: AROM;Both;10 reps;Supine Heel Slides: AROM;Both;20 reps;Supine Hip ABduction/ADduction:  AAROM;Both;20 reps;Supine Straight Leg Raises: AAROM;Both;10 reps;Supine    General Comments        Pertinent Vitals/Pain Pain Assessment: No/denies pain    Home Living                      Prior Function            PT Goals (current goals can now be found in the care plan section) Progress towards PT goals: Progressing toward goals    Frequency    Min 2X/week      PT Plan Current plan remains appropriate    Co-evaluation              AM-PAC PT "6 Clicks" Daily Activity  Outcome Measure  Difficulty turning over in bed (including adjusting bedclothes, sheets and blankets)?: None Difficulty moving from lying on back to sitting on the side of the bed? : A Little Difficulty sitting down on and standing up from a chair with arms (e.g., wheelchair, bedside commode, etc,.)?: A Little Help needed moving to and from a bed to chair (including a wheelchair)?: A Little Help needed  walking in hospital room?: A Little Help needed climbing 3-5 steps with a railing? : A Little 6 Click Score: 19    End of Session Equipment Utilized During Treatment: Oxygen Activity Tolerance: Patient tolerated treatment well Patient left: in bed;with call bell/phone within reach;with bed alarm set   PT Visit Diagnosis: Unsteadiness on feet (R26.81);Muscle weakness (generalized) (M62.81);Difficulty in walking, not elsewhere classified (R26.2)     Time: 6219-4712 PT Time Calculation (min) (ACUTE ONLY): 20 min  Charges:  $Therapeutic Exercise: 8-22 mins                    G Codes:        Larae Grooms, PTA 03/05/2017, 4:51 PM

## 2017-03-05 NOTE — Progress Notes (Signed)
Rounded on patient.  84 year old female with PAF on Eliquis, chronic diastolic CHF/pulmonary HTN, HTN, osteoporosis, and HOH.   Patent admitted on 03/02/2017 with dx of dyspnea and loculated pleural effusions.    Active problem list this admission includes (per Angelica Ran, NP's note) 03/05/2017:  Assessment & Plan    1.  Acute respiratory distress w/ hypoxia:  Likely multifactorial in the setting of diast chf w/ mild pulm vasc congestion and bilat Pl effusions - R>L.  Improved with increasing oral lasix dose.  Seen by pulm  no plan for thoracentesis.  Plan to f/u R effusion w/ outpt cxr.  2.  PAF: Dx in 01/2017.  In sinus on admission.  No AF on tele. Cont amio, ? blocker, eliquis (reduced dose 2/2 age/wt).  Will need outpt f/u of LFTs, PFTs, TFTs, and annual eye exams on amio.  3.  HFpEF: EF 55-60% by echo 01/2017.  BNP elevated on admission.  Minus 2.9L since admission on oral lasix and breathing improved. Renal fxn stable. HR/BP reasonable. Cont lasix 40 daily @ d/c (was on 20 daily prev). Has CHF clinic f/u on 1/14.  4.  Elevated troponin:  0.06 on admission.  Not trended.  No chest pain.  Likely represents demand ischemia in setting of resp failure.  No plans for ischemic eval.  5.  RUL nodule:  Stable @ 68mm on 03/02/17 CTA.  F/u in 1 yr.  Signed, Murray Hodgkins, NP  03/05/2017, 11:20 AM     This RN saw patient on 02/04/2017 during last admission.  Reviewed CHF education with patient.  Son not present in room today.    **Patient has scales, but does not weigh herself every morning.  Upon asking patient why she does not do this, patient replied, "I do not know why I do not weigh myself daily. The home health nurse weighs me as well as the physical therapist.  I just don't."    Encourage patient to weigh everyday and assess her weight along with her symptoms.    Reviewed the following information with patient:  *Discussed when to call the Dr= weight gain of >2-3 lb overnight of 5lb in  a week,  *Discussed yellow zone= call MD: weight gain of >2-3 lb overnight of 5lb in a week, increased swelling, increased SOB when lying down, chest discomfort, dizziness, increased fatigue *Red Zone= call 911: struggle to breath, fainting or near fainting, significant chest pain   **Reviewed low sodium diet-provided handout of recommended and not recommended foods. Patient informed this RN that she just loves salt.  Patient stated, "I had rather have salty foods over sweets.  And those cheese curls, well, I could eat a peck."  This RN explained what salt does to her body and heart failure.  Patient stated, "I'm going to quit eating salt."   ? **Instructed patient to take medications as prescribed for heart failure. Explained briefly why pt is on the medications (either make you feel better, live longer or keep you out of the hospital) and discussed monitoring and side effects.  ? **Discussed exercise. Patient reports being active in her home.    Encouraged patient to be as active as possible.   ? **Smoking Cessation - Patient is a non-smoker.   ? **[facility] - patient is being followed int he [facility].  F/U appt scheduled for 03/09/2017 at 1:40 p.m.    Patient thanked me for stopping by to talk with her.  Roanna Epley, RN, BSN, [facility] Cardiovascular and Pulmonary Nurse Navigator

## 2017-03-05 NOTE — Consult Note (Signed)
[facility] Consultation    ASSESSMENT/PLAN   Congestive heart failure. CT scan does show bilateral pleural effusions right greater than left, there appears to be some minimal component of loculation in particular on the right. Patient does not have any clinical evidence of empyema, no infection symptomatology, fever, chills or purulent secretions or pleuritic chest pain. Would maximize cardiac medication and obtain follow-up chest x-ray in the outpatient setting. No additional recommendations from pulmonary point of view  Name: Krystal White MRN: [id number] DOB: Jun 23, 1924    ADMISSION DATE:  03/02/2017 CONSULTATION DATE:  03/05/2017  REFERRING MD :  Hospitalist  CHIEF COMPLAINT: Shortness of Breath   HISTORY OF PRESENT ILLNESS:   Krystal White is a very pleasant 84 year old female with a past medical history remarkable for hypertension, pulmonary hypertension, atrial fibrillation, congestive heart failure and osteoporosis, presents emergency department with increasing shortness of breath. She was noted to be hypoxemic on room air by her physical therapist who is viewing her at home.  emergency department she was noted to be hypoxemic, with an elevated BMP and chest x-ray findings consistent with congestive heart failure. She received diuretic therapy and was admitted for congestive heart failure and atrial fibrillation with rapid ventricular response. She states she feels excellent right now, is denying any dyspnea, denied any prodromal fever, chills, night sweats, weight loss or signs and symptoms of infection. Denied any mucopurulent secretions or pleuritic chest pain   PAST MEDICAL HISTORY :  Past Medical History:  Diagnosis Date  . Hypertension   . Osteoporosis   . PAF (paroxysmal atrial fibrillation) ([place])   . Pulmonary hypertension ([place])    History reviewed. No pertinent surgical history. Prior to Admission medications   Medication Sig Start Date End  Date Taking? Authorizing Provider  amiodarone (PACERONE) 200 MG tablet Take 1 tablet (200 mg total) by mouth 2 (two) times daily. 02/07/17  Yes Sainani, Belia Heman, MD  apixaban (ELIQUIS) 2.5 MG TABS tablet Take 1 tablet (2.5 mg total) by mouth 2 (two) times daily. 02/07/17  Yes Sainani, Belia Heman, MD  fluticasone (FLONASE) 50 MCG/ACT nasal spray Place 2 sprays into the nose daily. 12/23/16 12/23/17 Yes [provider]  metoprolol succinate (TOPROL-XL) 100 MG 24 hr tablet Take 100 mg PO in the morning and 50 mg in the evening. 02/07/17  Yes Henreitta Leber, MD  simvastatin (ZOCOR) 20 MG tablet Take 1 tablet by mouth daily. 12/10/16   [provider]   No Known Allergies  FAMILY HISTORY:  Family History  Problem Relation Age of Onset  . Heart failure Sister    SOCIAL HISTORY:  reports that  has never smoked. she has never used smokeless tobacco. She reports that she does not drink alcohol or use drugs.  REVIEW OF SYSTEMS:   Constitutional: Feels well. Cardiovascular: No chest pain.  Pulmonary: States her shortness of breath has resolved.   The remainder of systems were reviewed and were found to be negative other than what is documented in the HPI.    VITAL SIGNS: Temp:  [98.1 F (36.7 C)-98.5 F (36.9 C)] 98.1 F (36.7 C) (01/10 0749) Pulse Rate:  [55-70] 70 (01/10 0749) Resp:  [17-22] 18 (01/10 0749) BP: (129-148)/(44-51) 148/51 (01/10 0749) SpO2:  [88 %-99 %] 94 % (01/10 0749) Weight:  [49.3 kg (108 lb 9.6 oz)] 49.3 kg (108 lb 9.6 oz) (01/10 0405) HEMODYNAMICS:  INTAKE / OUTPUT:  Intake/Output Summary (Last 24 hours) at 03/05/2017 1058 Last data filed  at 03/05/2017 1018 Gross per 24 hour  Intake 960 ml  Output 1100 ml  Net -140 ml    Physical Examination:   VS: BP (!) 148/51 (BP Location: Left Arm)   Pulse 70   Temp 98.1 F (36.7 C) (Oral)   Resp 18   Ht 5\' 1"  (1.549 m)   Wt 49.3 kg (108 lb 9.6 oz)   SpO2 94%   BMI 20.52 kg/m   General  Appearance: No distress  Neuro:without focal findings, mental status, speech normal,. patient is hard of hearing  HEENT:  trachea is midline, no thyromegaly appreciated, no jugular venous distention noted  Pulmonary: clear with mild reduction in breath sounds in the lung bases. Cardiovascular irregularly irregular rhythm with controlled ventricular response    Abdomen: Benign, Soft, non-tender, No masses, hepatosplenomegaly, No lymphadenopathy Skin:   warm, no rashes, no ecchymosis  Extremities: normal, no cyanosis, clubbing, no edema, warm with normal capillary refill.    LABS: Reviewed   LABORATORY PANEL:   CBC Recent Labs  Lab 03/02/17 1619  WBC 6.3  HGB 13.0  HCT 38.9  PLT 193    Chemistries  Recent Labs  Lab 03/05/17 0418  NA 135  K 4.4  CL 96*  CO2 31  GLUCOSE 102*  BUN 18  CREATININE 0.97  CALCIUM 8.6*  MG 1.9    No results for input(s): GLUCAP in the last 168 hours. No results for input(s): PHART, PCO2ART, PO2ART in the last 168 hours. No results for input(s): AST, ALT, ALKPHOS, BILITOT, ALBUMIN in the last 168 hours.  Cardiac Enzymes Recent Labs  Lab 03/02/17 1619  TROPONINI 0.06*    RADIOLOGY:  No results found.    Krystal Dellen, DO  03/05/2017, 10:58 AM

## 2017-03-06 ENCOUNTER — Inpatient Hospital Stay: Payer: Medicare Other

## 2017-03-06 MED ORDER — AMIODARONE HCL 200 MG PO TABS: 200.0000 mg | ORAL_TABLET | Freq: Every day | ORAL | 0 refills | Status: DC

## 2017-03-06 MED ORDER — BUDESONIDE-FORMOTEROL FUMARATE 160-4.5 MCG/ACT IN AERO: 2.0000 | INHALATION_SPRAY | Freq: Two times a day (BID) | RESPIRATORY_TRACT | 12 refills | Status: DC

## 2017-03-06 MED ORDER — METOPROLOL TARTRATE 25 MG PO TABS: 12.5000 mg | ORAL_TABLET | Freq: Two times a day (BID) | ORAL | 0 refills | Status: DC

## 2017-03-06 MED ORDER — ALBUTEROL SULFATE HFA 108 (90 BASE) MCG/ACT IN AERS: 2.0000 | INHALATION_SPRAY | Freq: Four times a day (QID) | RESPIRATORY_TRACT | 2 refills | Status: DC | PRN

## 2017-03-06 MED ORDER — FUROSEMIDE 40 MG PO TABS: 40.0000 mg | ORAL_TABLET | Freq: Every day | ORAL | 0 refills | Status: DC

## 2017-03-06 NOTE — Care Management Important Message (Signed)
Important Message  Patient Details  Name: Krystal White MRN: [id number] Date of Birth: March 27, 1924   Medicare Important Message Given:  Yes    Beverly Sessions, RN 03/06/2017, 3:24 PM

## 2017-03-06 NOTE — Progress Notes (Signed)
Patient discharge was cancelled due to safety reason as pt live alone . With some confusion and hard of hearing , and .  will be re-evaluated in am by PT .

## 2017-03-06 NOTE — Care Management (Signed)
Patient for discharge home today. Order present for oxygen and referral called to and accepted by [facility].  Notified [facility] of discharge today.

## 2017-03-06 NOTE — Progress Notes (Signed)
    Remains in sinus rhythm with heart rates in the 60s bpm. No labs today. No changes from rounding note on 1/10.

## 2017-03-06 NOTE — Clinical Social Work Note (Addendum)
Patient was to discharge today, PT was recommending home health, however bedside nurse and family expressed safety concerns about patient returning home alone due to weakness, increased confusion, and new oxygen.  This CSW attempted to contact PT to see if they can reevaluate on Saturday 03/07/17, unable to contact a PT.  CSW contacted weekend CSW who will call PT in the morning to request reevaluation and work on SNF placement.  Case manager and physician made aware of concerns by family and CSW.  Jones Broom. [place], MSW, [place]  03/06/2017 6:28 PM

## 2017-03-06 NOTE — Progress Notes (Signed)
Pt transferred to room 107. Pt's belongings and home o2 with patient. VSS. No further complaints.

## 2017-03-06 NOTE — Discharge Summary (Signed)
[place] [facility] [place] NAME: Jadynn Epping    MR#:  [id number]  DATE OF BIRTH:  1925-02-06  DATE OF ADMISSION:  03/02/2017 ADMITTING PHYSICIAN: Fritzi Mandes, MD  DATE OF DISCHARGE: No discharge date for patient encounter.  PRIMARY CARE PHYSICIAN: Derinda Late, MD    ADMISSION DIAGNOSIS:  Hypokalemia [E87.6] Sinus bradycardia [R00.1] Prolonged Q-T interval on ECG [R94.31] Hypoxia [R09.02] Elevated troponin [R74.8] Bilateral pleural effusion [J90] Acute on chronic congestive heart failure, unspecified heart failure type (HCC) [I50.9]  DISCHARGE DIAGNOSIS:  Active Problems:   CHF (congestive heart failure) ([place])   SECONDARY DIAGNOSIS:   Past Medical History:  Diagnosis Date  . Hypertension   . Osteoporosis   . PAF (paroxysmal atrial fibrillation) ([place])   . Pulmonary hypertension ([place])     HOSPITAL COURSE:   [name] a84 y.o.femalewith a known history of congestive heart failure diastolic, hypertension, chronic A. fib on oral anticoagulation comes to the emergency room from home after she started having increasing shortness of breath.Her physical therapist came to evaluate her told her that she had abnormal vital signs needed to go to the hospital. Patient sats were 82% on room air. She is 98% on 2 L nasal cannula oxygen. She has elevated BNP and her chest x-ray is consistent with congestive heart failure.   1. Acute on chronic diastolic congestive heart failure, LV EF: 55% - 60% Resolved Patient was placed on our congestive heart failure protocol, treated with IV Lasix which was converted to p.o., patient with continued O2 requirement on day of discharge-we will continue with home oxygen status post discharge 2 L continuous, chest x-ray on discharge noted for resolution of congestive heart failure/edema, to follow-up with congestive heart failure clinic status post discharge for reevaluation  2.  Pleural effusions, bilaterally Critical care attending/pulmonology did not recommend thoracentesis-recommended outpatient follow-up with chest x-ray and treatment of congestive heart failure per above  3. Acute respiratory failure with hypoxia due to CHF Stable Plan of care as stated above-patient was discharged home on home oxygen 2 L via nasal cannula continuous, 88% on room air/with activity, required 2 L to maintain 92% saturation  4. Proximal A. fib with RVR Cardiology did see patient while in house- decreasedamiodarone to 200 mg daily, Lopressor to 12.5 mg bid, Eliquis 2.5 mg bid as per Dr. Saunders Revel.  5. Hypokalemia Repleted  6.Elevated troponin Most likely due to congestive heart failure/demand ischemia Continued Eliquis.    DISCHARGE CONDITIONS:   On day of discharge patient is afebrile, stable, tolerating diet without difficulty, ready for discharge to home with appropriate follow-up with primary care provider in 3-5 days for reevaluation, pulmonology in 2 weeks for reevaluation of COPD/bilateral pleural effusions, patient to follow-up in congestive heart failure clinic as well  CONSULTS OBTAINED:  Treatment Team:  Minus Breeding, MD  DRUG ALLERGIES:  No Known Allergies  DISCHARGE MEDICATIONS:   Allergies as of 03/06/2017   No Known Allergies     Medication List    STOP taking these medications   metoprolol succinate 100 MG 24 hr tablet Commonly known as:  TOPROL-XL     TAKE these medications   albuterol 108 (90 Base) MCG/ACT inhaler Commonly known as:  PROVENTIL HFA;VENTOLIN HFA Inhale 2 puffs into the lungs every 6 (six) hours as needed for wheezing or shortness of breath.   amiodarone 200 MG tablet Commonly known as:  PACERONE Take 1 tablet (200 mg total) by mouth daily. Start taking on:  03/07/2017 What changed:  when to take this   apixaban 2.5 MG Tabs tablet Commonly known as:  ELIQUIS Take 1 tablet (2.5 mg total) by mouth 2 (two) times  daily.   budesonide-formoterol 160-4.5 MCG/ACT inhaler Commonly known as:  SYMBICORT Inhale 2 puffs into the lungs 2 (two) times daily.   fluticasone 50 MCG/ACT nasal spray Commonly known as:  FLONASE Place 2 sprays into the nose daily.   furosemide 40 MG tablet Commonly known as:  LASIX Take 1 tablet (40 mg total) by mouth daily. Start taking on:  03/07/2017   metoprolol tartrate 25 MG tablet Commonly known as:  LOPRESSOR Take 0.5 tablets (12.5 mg total) by mouth 2 (two) times daily.   simvastatin 20 MG tablet Commonly known as:  ZOCOR Take 1 tablet by mouth daily.            Durable Medical Equipment  (From admission, onward)        Start     Ordered   03/06/17 1234  DME Oxygen  Once    Question Answer Comment  Mode or (Route) Nasal cannula   Liters per Minute 2   Frequency Continuous (stationary and portable oxygen unit needed)   Oxygen conserving device Yes   Oxygen delivery system Gas      03/06/17 1234       DISCHARGE INSTRUCTIONS:    If you experience worsening of your admission symptoms, develop shortness of breath, life threatening emergency, suicidal or homicidal thoughts you must seek medical attention immediately by calling 911 or calling your MD immediately  if symptoms less severe.  You Must read complete instructions/literature along with all the possible adverse reactions/side effects for all the Medicines you take and that have been prescribed to you. Take any new Medicines after you have completely understood and accept all the possible adverse reactions/side effects.   Please note  You were cared for by a hospitalist during your hospital stay. If you have any questions about your discharge medications or the care you received while you were in the hospital after you are discharged, you can call the unit and asked to speak with the hospitalist on call if the hospitalist that took care of you is not available. Once you are discharged, your  primary care physician will handle any further medical issues. Please note that NO REFILLS for any discharge medications will be authorized once you are discharged, as it is imperative that you return to your primary care physician (or establish a relationship with a primary care physician if you do not have one) for your aftercare needs so that they can reassess your need for medications and monitor your lab values.    Today   CHIEF COMPLAINT:   Chief Complaint  Patient presents with  . Shortness of Breath    HISTORY OF PRESENT ILLNESS:  Krystal White  is a 84 y.o. female with a known history of congestive heart failure diastolic, hypertension, chronic A. fib on oral anticoagulation comes to the emergency room from home after she started having increasing shortness of breath.  Her physical therapist came to evaluate her told her that she had abnormal vital signs needed to go to the hospital.  Patient sats were 82% on room air.  She is 98% on 2 L nasal cannula oxygen.  She has elevated BNP and her chest x-ray is consistent with congestive heart failure.  She received IV Lasix in the ER she is being admitted for acute on chronic congestive  heart failure with acute on chronic rapid A. fib with RVR.     VITAL SIGNS:  Blood pressure (!) 133/42, pulse 62, temperature 98.5 F (36.9 C), temperature source Oral, resp. rate 18, height 5\' 1"  (1.549 m), weight 48.8 kg (107 lb 8 oz), SpO2 90 %.  I/O:    Intake/Output Summary (Last 24 hours) at 03/06/2017 1236 Last data filed at 03/06/2017 1043 Gross per 24 hour  Intake 603 ml  Output 800 ml  Net -197 ml    PHYSICAL EXAMINATION:  GENERAL:  84 y.o.-year-old patient lying in the bed with no acute distress.  EYES: Pupils equal, round, reactive to light and accommodation. No scleral icterus. Extraocular muscles intact.  HEENT: Head atraumatic, normocephalic. Oropharynx and nasopharynx clear.  NECK:  Supple, no jugular venous distention. No  thyroid enlargement, no tenderness.  LUNGS: Normal breath sounds bilaterally, no wheezing, rales,rhonchi or crepitation. No use of accessory muscles of respiration.  CARDIOVASCULAR: S1, S2 normal. No murmurs, rubs, or gallops.  ABDOMEN: Soft, non-tender, non-distended. Bowel sounds present. No organomegaly or mass.  EXTREMITIES: No pedal edema, cyanosis, or clubbing.  NEUROLOGIC: Cranial nerves II through XII are intact. Muscle strength 5/5 in all extremities. Sensation intact. Gait not checked.  PSYCHIATRIC: The patient is alert and oriented x 3.  SKIN: No obvious rash, lesion, or ulcer.   DATA REVIEW:   CBC Recent Labs  Lab 03/02/17 1619  WBC 6.3  HGB 13.0  HCT 38.9  PLT 193    Chemistries  Recent Labs  Lab 03/05/17 0418  NA 135  K 4.4  CL 96*  CO2 31  GLUCOSE 102*  BUN 18  CREATININE 0.97  CALCIUM 8.6*  MG 1.9    Cardiac Enzymes Recent Labs  Lab 03/02/17 1619  TROPONINI 0.06*    Microbiology Results  Results for orders placed or performed during the hospital encounter of 03/02/17  Blood culture (routine x 2)     Status: None (Preliminary result)   Collection Time: 03/02/17  4:19 PM  Result Value Ref Range Status   Specimen Description BLOOD LEFT ANTECUBITAL  Final   Special Requests   Final    BOTTLES DRAWN AEROBIC AND ANAEROBIC Blood Culture adequate volume   Culture   Final    NO GROWTH 4 DAYS Performed at [facility], [address], [place], [place] [address]    Report Status PENDING  Incomplete  Blood culture (routine x 2)     Status: None (Preliminary result)   Collection Time: 03/02/17  4:19 PM  Result Value Ref Range Status   Specimen Description BLOOD BLOOD LEFT ARM  Final   Special Requests   Final    BOTTLES DRAWN AEROBIC AND ANAEROBIC Blood Culture adequate volume   Culture   Final    NO GROWTH 4 DAYS Performed at [facility], [address], [place], [place] [address]    Report Status PENDING  Incomplete     RADIOLOGY:  Dg Chest 2 View  Result Date: 03/06/2017 CLINICAL DATA:  Shortness of Breath EXAM: CHEST  2 VIEW COMPARISON:  Chest CT March 02, 2017 and chest radiograph February 02, 2017 FINDINGS: There is a degree of underlying fibrotic type change. There is no frank edema or consolidation. Heart is mildly enlarged with pulmonary vascularity within normal limits. There is aortic atherosclerosis. No adenopathy. There is marked collapse of a midthoracic vertebral body, stable. There is underlying osteoporosis. IMPRESSION: Underlying fibrotic type change without edema or consolidation. Stable cardiac silhouette.  There is aortic atherosclerosis. Bones osteoporotic with marked wedging of a midthoracic vertebral body, stable. Aortic Atherosclerosis (ICD10-I70.0). Electronically Signed   By: Lowella Grip III M.D.   On: 03/06/2017 09:35    EKG:   Orders placed or performed during the hospital encounter of 03/02/17  . ED EKG  . ED EKG      Management plans discussed with the patient, family and they are in agreement.  CODE STATUS:     Code Status Orders  (From admission, onward)        Start     Ordered   03/02/17 2025  Do not attempt resuscitation (DNR)  Continuous    Question Answer Comment  In the event of cardiac or respiratory ARREST Do not call a "code blue"   In the event of cardiac or respiratory ARREST Do not perform Intubation, CPR, defibrillation or ACLS   In the event of cardiac or respiratory ARREST Use medication by any route, position, wound care, and other measures to relive pain and suffering. May use oxygen, suction and manual treatment of airway obstruction as needed for comfort.      03/02/17 2024    Code Status History    Date Active Date Inactive Code Status Order ID Comments User Context   02/02/2017 11:46 02/07/2017 14:57 DNR [id number]  [name], MD Inpatient    [facility] Directive Documentation     Most Recent Value  Type of [facility]  Directive  Healthcare Power of Attorney, Living will  Pre-existing out of facility DNR order (yellow form or pink MOST form)  No data  "MOST" Form in Place?  No data      TOTAL TIME TAKING CARE OF THIS PATIENT: 45 minutes.    Avel Peace Duwan Adrian M.D on 03/06/2017 at 12:36 PM  Between 7am to 6pm - Pager - [phone]  After 6pm go to [email or web address] - password [email or web address] [facility]  Office  [phone]  CC: Primary care physician; Derinda Late, MD   Note: This dictation was prepared with Dragon dictation along with smaller phrase technology. Any transcriptional errors that result from this process are unintentional.

## 2017-03-06 NOTE — Care Management (Signed)
Primary nurse informed CM that patient lives alone and concern that patient will not be able to maneuver her walker and portable oxygen and or long oxygen tubing from a stationary concentrator.    CM spoke with son, Mr Schiller asks about sending patient to get rehab and discussed that physical therapy has recommended home with home health.  CM reviewed physical therapy notes.  Patient was evaluated 03/03/2017 and ambulated 20 feet and desatted with oxygen.  She needed 2+ assist to maneuver equipment.  During her treatment on 03/05/2017 it appears that patient was only able to perform bed exercises. She did not feel like ambulating during that session.  CM discussed with primary nurse to assess how patient ambulated with her portable oxygen.  Patient could not even stand alone from sitting position. Patient could not ambulate with 2+ assist.   Discussed with CSW and attending.  Will cancel the discharge and have physical therapy reassess patient 11.12.  as it is felt she most likely would meet skilled nursing facility placement. It is not a safe discharge for patient to discharge home in this deconditioned state to live alone- even if had home health services.  If she does not qualify for skilled nursing, then son would like to pursue assisted living environment.  Notified [facility] and [facility] that discharge has been cancelled

## 2017-03-06 NOTE — Plan of Care (Signed)
  Progressing Education: Knowledge of General Education information will improve 03/06/2017 1316 - Progressing by Darrelyn Hillock, RN Health Behavior/Discharge Planning: Ability to manage health-related needs will improve 03/06/2017 1316 - Progressing by Darrelyn Hillock, RN Clinical Measurements: Ability to maintain clinical measurements within normal limits will improve 03/06/2017 1316 - Progressing by Darrelyn Hillock, RN Will remain free from infection 03/06/2017 1316 - Progressing by Darrelyn Hillock, RN Diagnostic test results will improve 03/06/2017 1316 - Progressing by Darrelyn Hillock, RN Respiratory complications will improve 03/06/2017 1316 - Progressing by Darrelyn Hillock, RN Cardiovascular complication will be avoided 03/06/2017 1316 - Progressing by Darrelyn Hillock, RN Activity: Risk for activity intolerance will decrease 03/06/2017 1316 - Progressing by Darrelyn Hillock, RN Nutrition: Adequate nutrition will be maintained 03/06/2017 1316 - Progressing by Darrelyn Hillock, RN Coping: Level of anxiety will decrease 03/06/2017 1316 - Progressing by Darrelyn Hillock, RN Elimination: Will not experience complications related to bowel motility 03/06/2017 1316 - Progressing by Darrelyn Hillock, RN Will not experience complications related to urinary retention 03/06/2017 1316 - Progressing by Darrelyn Hillock, RN Pain Managment: General experience of comfort will improve 03/06/2017 1316 - Progressing by Darrelyn Hillock, RN Safety: Ability to remain free from injury will improve 03/06/2017 1316 - Progressing by Darrelyn Hillock, RN Skin Integrity: Risk for impaired skin integrity will decrease 03/06/2017 1316 - Progressing by Darrelyn Hillock, RN Cardiac: Ability to achieve and maintain adequate cardiopulmonary perfusion will improve 03/06/2017 1316 -  Progressing by Darrelyn Hillock, RN

## 2017-03-07 ENCOUNTER — Encounter
Admission: RE | Admit: 2017-03-07 | Discharge: 2017-03-07 | Disposition: A | Discharge status: Home / Self Care | Payer: Medicare Other | Source: Ambulatory Visit | Attending: Internal Medicine | Admitting: Internal Medicine

## 2017-03-07 LAB — CULTURE, BLOOD (ROUTINE X 2)
CULTURE: NO GROWTH
CULTURE: NO GROWTH
SPECIAL REQUESTS: ADEQUATE
SPECIAL REQUESTS: ADEQUATE

## 2017-03-07 LAB — BLOOD GAS, VENOUS
ACID-BASE EXCESS: 5.5 mmol/L — AB (ref 0.0–2.0)
Bicarbonate: 32.4 mmol/L — ABNORMAL HIGH (ref 20.0–28.0)
PATIENT TEMPERATURE: 37
pCO2, Ven: 56 mmHg (ref 44.0–60.0)
pH, Ven: 7.37 (ref 7.250–7.430)

## 2017-03-07 MED ORDER — BUDESONIDE 0.5 MG/2ML IN SUSP: 0.5000 mg | Freq: Two times a day (BID) | RESPIRATORY_TRACT | Status: DC

## 2017-03-07 NOTE — NC FL2 (Signed)
[place] LEVEL OF CARE SCREENING TOOL     IDENTIFICATION  Patient Name: Krystal White Birthdate: January 11, 1925 Sex: female Admission Date (Current Location): 03/02/2017  [place] and [place] Number:  Engineering geologist and Address:  [facility], [address], [place], [place] [address]      Provider Number: [id number]  Attending Physician Name and Address:  Gorden Harms, MD  Relative Name and Phone Number:       Current Level of Care: Hospital Recommended Level of Care: [place] Prior Approval Number:    Date Approved/Denied:   PASRR Number: [id number])  Discharge Plan: SNF    Current Diagnoses: Patient Active Problem List   Diagnosis Date Noted  . CHF (congestive heart failure) ([place]) 03/02/2017  . Chronic diastolic heart failure ([place]) 02/13/2017  . HTN (hypertension) 02/13/2017  . Atrial fibrillation with RVR ([place]) 02/02/2017  . Acute diastolic CHF (congestive heart failure) ([place]) 02/02/2017    Orientation RESPIRATION BLADDER Height & Weight     Self, Place, Situation  O2(2 Liters Oxygen. ) Continent Weight: 107 lb 8 oz (48.8 kg) Height:  5\' 1"  (154.9 cm)  BEHAVIORAL SYMPTOMS/MOOD NEUROLOGICAL BOWEL NUTRITION STATUS      Continent Diet(Diet: Low Sodium. )  AMBULATORY STATUS COMMUNICATION OF NEEDS Skin   Extensive Assist Verbally Normal                       Personal Care Assistance Level of Assistance  Bathing, Feeding, Dressing Bathing Assistance: Limited assistance Feeding assistance: Independent Dressing Assistance: Limited assistance     Functional Limitations Info  Sight, Hearing, Speech Sight Info: Impaired Hearing Info: Impaired Speech Info: Adequate    SPECIAL CARE FACTORS FREQUENCY  PT (By licensed PT), OT (By licensed OT)     PT Frequency: (5) OT Frequency: (5)            Contractures      Additional Factors Info  Code Status, Allergies Code Status Info: (DNR  ) Allergies Info: (No Known Allergies. )           Current Medications (03/07/2017):  This is the current hospital active medication list Current Facility-Administered Medications  Medication Dose Route Frequency Provider Last Rate Last Dose  . acetaminophen (TYLENOL) tablet 650 mg  650 mg Oral Q6H PRN Fritzi Mandes, MD       Or  . acetaminophen (TYLENOL) suppository 650 mg  650 mg Rectal Q6H PRN Fritzi Mandes, MD      . albuterol (PROVENTIL) (2.5 MG/3ML) 0.083% nebulizer solution 2.5 mg  2.5 mg Nebulization Q6H PRN Fritzi Mandes, MD      . amiodarone (PACERONE) tablet 200 mg  200 mg Oral Daily Christell Faith M, PA-C   200 mg at 03/06/17 0845  . apixaban (ELIQUIS) tablet 2.5 mg  2.5 mg Oral BID Fritzi Mandes, MD   2.5 mg at 03/06/17 2104  . budesonide (PULMICORT) nebulizer solution 0.5 mg  0.5 mg Nebulization BID Salary, Montell D, MD      . dextromethorphan (DELSYM) 30 MG/5ML liquid 30 mg  30 mg Oral BID Demetrios Loll, MD   30 mg at 03/06/17 2104   And  . guaiFENesin (MUCINEX) 12 hr tablet 600 mg  600 mg Oral BID Demetrios Loll, MD   600 mg at 03/06/17 2103  . fluticasone (FLONASE) 50 MCG/ACT nasal spray 2 spray  2 spray Each Nare Daily PRN Fritzi Mandes, MD      .  furosemide (LASIX) tablet 40 mg  40 mg Oral Daily Christell Faith M, PA-C   40 mg at 03/06/17 0846  . guaiFENesin (ROBITUSSIN) 100 MG/5ML solution 100 mg  5 mL Oral Q4H PRN Demetrios Loll, MD      . lip balm (BLISTEX) ointment   Topical PRN Demetrios Loll, MD      . MEDLINE mouth rinse  15 mL Mouth Rinse BID Fritzi Mandes, MD   15 mL at 03/06/17 0846  . metoprolol tartrate (LOPRESSOR) tablet 12.5 mg  12.5 mg Oral BID Christell Faith M, PA-C   12.5 mg at 03/06/17 2103  . polyethylene glycol (MIRALAX / GLYCOLAX) packet 17 g  17 g Oral Daily PRN Fritzi Mandes, MD      . sodium chloride flush (NS) 0.9 % injection 3 mL  3 mL Intravenous Q12H Demetrios Loll, MD   3 mL at 03/06/17 2105     Discharge Medications: Please see discharge summary for a list of discharge  medications.  Relevant Imaging Results:  Relevant Lab Results:   Additional Information (SSN: [id number])  Colen Eltzroth, Veronia Beets, LCSW

## 2017-03-07 NOTE — Progress Notes (Signed)
Clinical Education officer, museum (CSW) presented bed offers to patient and she chose [employer]. Per [facility] admissions coordinator at [facility] she started [facility] SN[facility] authorization today and authorization has been received. Per Sharyn Lull patient can come to [facility] tomorrow to room 208-B.  [employer], LCSW [phone]

## 2017-03-07 NOTE — Clinical Social Work Note (Signed)
Clinical Social Work Assessment  Patient Details  Name: Krystal White MRN: [id number] Date of Birth: 1924/05/08  Date of referral:  03/07/17               Reason for consult:  Facility Placement                Permission sought to share information with:  Chartered certified accountant granted to share information::  Yes, Verbal Permission Granted  Name::      [place]::   [place]   Relationship::     Contact Information:     Housing/Transportation Living arrangements for the past 2 months:  [place] of Information:  Patient Patient Interpreter Needed:  None Criminal Activity/Legal Involvement Pertinent to Current Situation/Hospitalization:  No - Comment as needed Significant Relationships:  Adult Children Lives with:  Self Do you feel safe going back to the place where you live?  Yes Need for family participation in patient care:  Yes (Comment)  Care giving concerns:  Patient lives alone in [place].    Social Worker assessment / plan:  Holiday representative (CSW) discussed case with PT and the recommendation has changed from home health to SNF. CSW met with patient alone at bedside to discuss D/C plan. Patient was alert and oriented X3 however she was very hard of hearing. CSW introduced self and explained role of CSW department. Patient reported that she lives alone in [place]. Per patient her son Ronalee Belts lives in [place] and has a bad hip and is suppose to be getting a hip replacement. Patient reported that she does not have any friends or neighbors that can assist her and wants to go to SNF for rehab. CSW explained SNF process and that [facility] will have to approve SNF. Patient verbalized her understanding and is agreeable to SNF search in [place]. Patient prefers [facility] Per patient she is okay with a semi-private room. FL2 complete and faxed out. CSW will continue to follow and assist as needed.     Employment status:  Disabled (Comment on whether or not currently receiving Disability), Retired Nurse, adult PT Recommendations:  Social Circle / Referral to community resources:  [place]  Patient/Family's Response to care:  Patient is agreeable to [facility] in [place].   Patient/Family's Understanding of and Emotional Response to Diagnosis, Current Treatment, and Prognosis:  Patient was very pleasant and thanked CSW for assistance.   Emotional Assessment Appearance:  Appears stated age Attitude/Demeanor/Rapport:    Affect (typically observed):  Accepting, Adaptable, Pleasant Orientation:  Oriented to Self, Oriented to Place, Oriented to  Time, Oriented to Situation Alcohol / Substance use:  Not Applicable Psych involvement (Current and /or in the community):  No (Comment)  Discharge Needs  Concerns to be addressed:  Discharge Planning Concerns Readmission within the last 30 days:  No Current discharge risk:  Dependent with Mobility Barriers to Discharge:  Continued Medical Work up   [employer], Veronia Beets, LCSW 03/07/2017, 9:57 AM

## 2017-03-07 NOTE — Progress Notes (Signed)
Krystal White is doing well throughout shift. Free of falls this shift. Ambulated with O2, walker and PT at side. (O2 1.5 L [place]). BP medication given/stable. Tolerating food. Resting in bed at the current time. Plan to d/c to SNF.

## 2017-03-07 NOTE — Progress Notes (Signed)
Physical Therapy Treatment Patient Details Name: Krystal White MRN: [id number] DOB: [birth year] Today's Date: 03/07/2017    History of Present Illness Pt admitted for CHF. Of note, negative for PE although positive for R upper lobe nodule. History includes CHF, HTN, and chronic A fib. Pt complains of SOB symptoms, currently on 2L of O2.    PT Comments    Pt in bed. Ready for therapy.  On 1 lpm O2 at rest.  Sats 84% HR 90.  Pt stated she did not use oxygen at home and did not want to return home with it.  O2 removed to check qualifying O2 sats.  While supine, pt dropped steadily to 87% HR 120 after several minutes.  O2 was reapplied at this time and it returned to baseline shortly after.  Session was continued with O2 at 1 lpm and she was able to walk 80' with walker and min guard/asssit for balance and poor walker navigation and full assist to manage O2 cording and tank. She often ran walker into wall before correcting and had poor placement upon returning to chair.  Practiced 5 X sit to stand from recliner with poor hand placements, multiple attempts each time to stand and difficulty gaining balance often falling back into chair with no control.  She remained up in recliner for breakfast with vitals at baseline.    After therapy session today and discussion with pt regarding home environment and her voicing increasing difficulties managing her home and obtaining groceries and medication before admission, it would be appropriate to transition to SNF upon discharge.  She would benefit from rehab to increase strength, balance, overall mobility, education for energy conservation and ADL tasks, and education for O2 tank and cord management as she is unable to do so at this time.  She is agreeable to SNF.  Pt is at an increased fall risk at this time and would have significant trouble managing oxygen at home and it is probable that she would not use it due to knowledge and difficulty.     Follow Up  Recommendations  SNF     Equipment Recommendations  Standard walker    Recommendations for Other Services       Precautions / Restrictions Precautions Precautions: Fall    Mobility  Bed Mobility Overal bed mobility: Needs Assistance Bed Mobility: Supine to Sit     Supine to sit: Supervision     General bed mobility comments: Slow with cues to continue with transition  Transfers Overall transfer level: Needs assistance Equipment used: Rolling walker (2 wheeled) Transfers: Sit to/from Stand Sit to Stand: Min guard;Min assist            Ambulation/Gait Ambulation/Gait assistance: Min guard;Min assist Ambulation Distance (Feet): 80 Feet Assistive device: Rolling walker (2 wheeled) Gait Pattern/deviations: Step-through pattern;Decreased step length - right;Decreased step length - left;Staggering right;Staggering left;Narrow base of support   Gait velocity interpretation: Below normal speed for age/gender     Stairs            Wheelchair Mobility    Modified Rankin (Stroke Patients Only)       Balance Overall balance assessment: Needs assistance Sitting-balance support: Feet supported Sitting balance-Leahy Scale: Normal     Standing balance support: Bilateral upper extremity supported Standing balance-Leahy Scale: Poor                              Cognition Arousal/Alertness: Awake/alert Behavior During  Therapy: WFL for tasks assessed/performed Overall Cognitive Status: Within Functional Limits for tasks assessed                                        Exercises Other Exercises Other Exercises: 5X sit to stand with emphasis on hand placements and gaining balance upon standing.  Overall does poor and requires multiple attempts to stand each try.    General Comments        Pertinent Vitals/Pain Pain Assessment: No/denies pain    Home Living                      Prior Function            PT Goals  (current goals can now be found in the care plan section) Progress towards PT goals: Progressing toward goals    Frequency    Min 2X/week      PT Plan Discharge plan needs to be updated    Co-evaluation              AM-PAC PT "6 Clicks" Daily Activity  Outcome Measure  Difficulty turning over in bed (including adjusting bedclothes, sheets and blankets)?: A Little Difficulty moving from lying on back to sitting on the side of the bed? : A Little Difficulty sitting down on and standing up from a chair with arms (e.g., wheelchair, bedside commode, etc,.)?: Unable Help needed moving to and from a bed to chair (including a wheelchair)?: A Little Help needed walking in hospital room?: A Little Help needed climbing 3-5 steps with a railing? : A Little 6 Click Score: 16    End of Session Equipment Utilized During Treatment: Gait belt;Oxygen Activity Tolerance: Patient tolerated treatment well Patient left: in chair;with chair alarm set;with call bell/phone within reach Nurse Communication: Mobility status;Other (comment)       Time: 7829-5621 PT Time Calculation (min) (ACUTE ONLY): 23 min  Charges:  $Gait Training: 8-22 mins $Therapeutic Activity: 8-22 mins                    G Codes:       Chesley Noon, PTA 03/07/17, 9:01 AM

## 2017-03-07 NOTE — Clinical Social Work Placement (Addendum)
   CLINICAL SOCIAL WORK PLACEMENT  NOTE  Date:  03/07/2017  Patient Details  Name: Krystal White MRN: [id number] Date of Birth: June 05, 1924  Clinical Social Work is seeking post-discharge placement for this patient at the [place] level of care (*CSW will initial, date and re-position this form in  chart as items are completed):  Yes   Patient/family provided with [place] Work Department's list of facilities offering this level of care within the geographic area requested by the patient (or if unable, by the patient's family).  Yes   Patient/family informed of their freedom to choose among providers that offer the needed level of care, that participate in Medicare, Medicaid or managed care program needed by the patient, have an available bed and are willing to accept the patient.  Yes   Patient/family informed of [facility]'s ownership interest in [facility] and [facility], as well as of the fact that they are under no obligation to receive care at these facilities.  PASRR submitted to EDS on 03/07/17     PASRR number received on 03/07/17     Existing PASRR number confirmed on       FL2 transmitted to all facilities in geographic area requested by pt/family on 03/07/17     FL2 transmitted to all facilities within larger geographic area on       Patient informed that his/her managed care company has contracts with or will negotiate with certain facilities, including the following:         03-07-17   Patient/family informed of bed offers received.  Patient chooses bed at  [facility]     Physician recommends and patient chooses bed at      Patient to be transferred to  [facility] on  03-09-17.  Patient to be transferred to facility by  [facility]  Patient family notified on  03-09-17 of transfer.  Name of family member notified:   Kristian Covey      PHYSICIAN       Additional Comment:     _______________________________________________ [place], Veronia Beets, LCSW 03/07/2017, 9:56 AM Updated Jones Broom. [place], MSW, [place]  03/09/2017 12:10 PM

## 2017-03-07 NOTE — Progress Notes (Signed)
[facility] [place] NAME: Krystal White    MR#:  [id number]  DATE OF BIRTH:  [birth year]  SUBJECTIVE:  CHIEF COMPLAINT:   Chief Complaint  Patient presents with  . Shortness of Breath    REVIEW OF SYSTEMS:  CONSTITUTIONAL: No fever, fatigue or weakness.  EYES: No blurred or double vision.  EARS, NOSE, AND THROAT: No tinnitus or ear pain.  RESPIRATORY: No cough, shortness of breath, wheezing or hemoptysis.  CARDIOVASCULAR: No chest pain, orthopnea, edema.  GASTROINTESTINAL: No nausea, vomiting, diarrhea or abdominal pain.  GENITOURINARY: No dysuria, hematuria.  ENDOCRINE: No polyuria, nocturia,  HEMATOLOGY: No anemia, easy bruising or bleeding SKIN: No rash or lesion. MUSCULOSKELETAL: No joint pain or arthritis.   NEUROLOGIC: No tingling, numbness, weakness.  PSYCHIATRY: No anxiety or depression.   ROS  DRUG ALLERGIES:  No Known Allergies  VITALS:  Blood pressure 121/72, pulse 98, temperature 98.2 F (36.8 C), resp. rate (!) 22, height 5\' 1"  (1.549 m), weight 48.8 kg (107 lb 8 oz), SpO2 98 %.  PHYSICAL EXAMINATION:  GENERAL:  84 y.o.-year-old patient lying in the bed with no acute distress.  EYES: Pupils equal, round, reactive to light and accommodation. No scleral icterus. Extraocular muscles intact.  HEENT: Head atraumatic, normocephalic. Oropharynx and nasopharynx clear.  NECK:  Supple, no jugular venous distention. No thyroid enlargement, no tenderness.  LUNGS: Normal breath sounds bilaterally, no wheezing, rales,rhonchi or crepitation. No use of accessory muscles of respiration.  CARDIOVASCULAR: S1, S2 normal. No murmurs, rubs, or gallops.  ABDOMEN: Soft, nontender, nondistended. Bowel sounds present. No organomegaly or mass.  EXTREMITIES: No pedal edema, cyanosis, or clubbing.  NEUROLOGIC: Cranial nerves II through XII are intact. Muscle strength 5/5 in all extremities. Sensation intact. Gait not checked.  PSYCHIATRIC:  The patient is alert and oriented x 3.  SKIN: No obvious rash, lesion, or ulcer.   Physical Exam LABORATORY PANEL:   CBC Recent Labs  Lab 03/02/17 1619  WBC 6.3  HGB 13.0  HCT 38.9  PLT 193   ------------------------------------------------------------------------------------------------------------------  Chemistries  Recent Labs  Lab 03/05/17 0418  NA 135  K 4.4  CL 96*  CO2 31  GLUCOSE 102*  BUN 18  CREATININE 0.97  CALCIUM 8.6*  MG 1.9   ------------------------------------------------------------------------------------------------------------------  Cardiac Enzymes Recent Labs  Lab 03/02/17 1619  TROPONINI 0.06*   ------------------------------------------------------------------------------------------------------------------  RADIOLOGY:  Dg Chest 2 View  Result Date: 03/06/2017 CLINICAL DATA:  Shortness of Breath EXAM: CHEST  2 VIEW COMPARISON:  Chest CT March 02, 2017 and chest radiograph February 02, 2017 FINDINGS: There is a degree of underlying fibrotic type change. There is no frank edema or consolidation. Heart is mildly enlarged with pulmonary vascularity within normal limits. There is aortic atherosclerosis. No adenopathy. There is marked collapse of a midthoracic vertebral body, stable. There is underlying osteoporosis. IMPRESSION: Underlying fibrotic type change without edema or consolidation. Stable cardiac silhouette. There is aortic atherosclerosis. Bones osteoporotic with marked wedging of a midthoracic vertebral body, stable. Aortic Atherosclerosis (ICD10-I70.0). Electronically Signed   By: Lowella Grip III M.D.   On: 03/06/2017 09:35    ASSESSMENT AND PLAN:  [name] a89 y.o.femalewith a known history of congestive heart failure diastolic, hypertension, chronic A. fib on oral anticoagulation comes to the emergency room from home after she started having increasing shortness of breath.Her physical therapist came to evaluate  her told her that she had abnormal vital signs needed to go to the hospital. Patient sats  were 82% on room air. She is 98% on 2 L nasal cannula oxygen. She has elevated BNP and her chest x-ray is consistent with congestive heart failure.   1. Acute on chronic diastolic congestive heart failure, LV EF: 55% - 60% Resolved Patient was placed on our congestive heart failure protocol, treated with IV Lasix which was converted to p.o., patient with continued O2 requirement -has qualified for home oxygen-2 L via nasal cannula continuous, repeat chest x-ray noted for resolution of congestive heart failure/edema, will f/u with congestive heart failure clinic status post discharge for reevaluation, discharge held on March 06, 2017 as patient was reevaluated and now is recommended for SNF placement given frailty/deconditioning  2.Pleural effusions, bilaterally Critical care attending/pulmonology did not recommend thoracentesis-recommended outpatient follow-up with chest x-ray and treatment of congestive heart failure per above  3.Acute respiratory failure with hypoxia due to CHF Stable Plan of care as stated above Home oxygen as stated above - 2 L via nasal cannula continuous, 88% on room air/with activity, required 2 L to maintain 92% saturation  4.Proximal A. fib with RVR Cardiology did see patient while in house-decreasedamiodarone to 200 mg daily,continue Lopressor to 12.5 mg bid, Eliquis 2.5 mg bidas perDr. End.  5. Hypokalemia Repleted  6.Elevated troponin Most likely due to congestive heart failure/demand ischemia ContinuedEliquis.  Disposition to SNF arranged for on Monday  All the records are reviewed and case discussed with Care Management/Social Workerr. Management plans discussed with the patient, family and they are in agreement.  CODE STATUS: dnr  TOTAL TIME TAKING CARE OF THIS PATIENT: 35 minutes.     POSSIBLE D/C IN 2 DAYS, DEPENDING ON CLINICAL  CONDITION.   Avel Peace Salary M.D on 03/07/2017   Between 7am to 6pm - Pager - [phone]  After 6pm go to [email or web address] - password [email or web address] [facility]  Office  [phone]  CC: Primary care physician; Derinda Late, MD  Note: This dictation was prepared with Dragon dictation along with smaller phrase technology. Any transcriptional errors that result from this process are unintentional.

## 2017-03-08 ENCOUNTER — Inpatient Hospital Stay: Payer: Medicare Other

## 2017-03-08 LAB — GLUCOSE, CAPILLARY: Glucose-Capillary: 117 mg/dL — ABNORMAL HIGH (ref 65–99)

## 2017-03-08 MED ORDER — SODIUM CHLORIDE 0.9 % IV SOLN
INTRAVENOUS | Status: AC
  Administered 2017-03-08: 10:00:00 via INTRAVENOUS

## 2017-03-08 MED ORDER — SODIUM CHLORIDE 0.9 % IV BOLUS (SEPSIS)
500.0000 mL | Freq: Once | INTRAVENOUS | Status: AC
  Administered 2017-03-08: 500 mL via INTRAVENOUS

## 2017-03-08 NOTE — Progress Notes (Deleted)
Patient ID: Krystal White, female    DOB: [birth year], 84 y.o.   MRN: [id number]  HPI  Krystal White is a 84 y/o female with a history of HTN, atrial fibrillation, osteoporosis and chronic heart failure.   Echo report from 02/02/17 reviewed and showed an EF of 55-60% along with moderate TR, mild AR/MR and severely elevated PA pressure of 65 mm Hg.  Admitted 02/02/17 due to acute HF along with AF with RVR. Initially needed IV diuretics along with IV cardizem and IV metoprolol. Cardiology consult obtained. Oral medications were adjusted. Discharged home after 5 days.   She presents today for her initial visit with a chief complaint of minimal shortness of breath upon moderate exertion. She describes this as chronic in nature having been present for several years with varying levels of severity. She has associated fatigue, cough, palpitations, light-headedness and difficulty sleeping at night. She denies any chest pain, edema or easy bruising. Would like some assistance in the home to help with her ADL's and managing her home environment so she can remain independent at home.   Past Medical History:  Diagnosis Date  . Hypertension   . Osteoporosis   . PAF (paroxysmal atrial fibrillation) ([place])   . Pulmonary hypertension ([place])    No past surgical history on file. Family History  Problem Relation Age of Onset  . Heart failure Sister    Social History   Tobacco Use  . Smoking status: Never Smoker  . Smokeless tobacco: Never Used  Substance Use Topics  . Alcohol use: No   No Known Allergies   Review of Systems  Constitutional: Positive for fatigue. Negative for appetite change.  HENT: Positive for hearing loss and rhinorrhea. Negative for congestion and sore throat.   Eyes: Negative.   Respiratory: Positive for cough and shortness of breath ("little bit"). Negative for chest tightness.   Cardiovascular: Positive for palpitations. Negative for chest pain and leg swelling.   Gastrointestinal: Negative for abdominal distention and abdominal pain.  Endocrine: Negative.   Genitourinary: Negative.   Musculoskeletal: Negative for back pain and neck pain.  Skin: Negative.   Allergic/Immunologic: Negative.   Neurological: Positive for light-headedness ("unsteady"). Negative for dizziness.  Hematological: Negative for adenopathy. Does not bruise/bleed easily.  Psychiatric/Behavioral: Positive for sleep disturbance (sleeping much during the day/awake at night). Negative for dysphoric mood. The patient is not nervous/anxious.    Assessment & Plan:  1: Chronic heart failure with preserved ejection fraction- - NYHA class II - euvolemic today - currently not weighing daily but does have some scales. Discussed the importance of weighing daily and to call for an overnight weight gain of >2 pounds or a weekly weight gain of >5 pounds - not adding salt and has been reading food labels for years. Reviewed the importance of closely following a 2000mg  sodium diet and written dietary information was given to her about this. - encouraged her to elevate her legs when she sits for long periods of time - referral made to [facility] for an evaluation/assistance in the home. May need PT or social work  2: HTN- - BP looks good today - saw PCP Baldemar Lenis) 12/23/16 - BMP from 02/06/17 reviewed and showed sodium 136, potassium 5.1 and GFR 53  3: Atrial fibrillation- - currently rate controlled at this time - currently on amiodarone, metoprolol succinate and apixaban - hasn't noticed any increase in bleeding/bruising - did not have cardiology follow-up so an appointment was made with Dr. Fletcher Anon for 04/07/16  Patient did not bring her medications nor a list. Each medication was verbally reviewed with the patient and she was encouraged to bring the bottles to every visit to confirm accuracy of list.  Return in 1 month or sooner for any questions/problems before then.

## 2017-03-08 NOTE — Progress Notes (Signed)
[place] arrived in response to rapid response Code stroke to patient's room. Medical staff working with patient. No family at this time. CH provided silent prayer and support. [place] available for follow-up at request of staff or family.   03/08/17 0727  Clinical Encounter Type  Visited With Patient not available;Health care provider  Visit Type Initial;Spiritual support;Code  Referral From Nurse;Physician

## 2017-03-08 NOTE — Significant Event (Signed)
Rapid Response Event Note  Overview: Time Called: 0727 Arrival Time: 0729 Event Type: Neurologic  Initial Focused Assessment: Pt in bathroom, syncopal episode? Stroke?   Interventions: Dr Jerelyn Charles to bedside to assess pt Patient back to bed and stabilized, NIH stoke scale normal, code stroke cancelled.  Plan of Care (if not transferred): Bolus 516ml ns, Maddy RN to call for further assistance.  Event Summary: Name of Physician Notified: Salary at 0730    at    Outcome: Stayed in room and stabalized  Event End Time: [place]

## 2017-03-08 NOTE — Progress Notes (Signed)
Called to room by NT Angela, Pt had walked to the BR with assistance, was walking back to the bed when pt became unresponsive and limp. Upon assessment, pt was unresponsive to voice and touch. This RN, Animator, and NT Levada Dy sat pt on bedside commode to assess pt. RRT called. BG 117. VS 143/122, HR 30, 02 77. Pt placed on 3L 02. MD called to come assess patient. Pt became responsive with slurred speech, code stroke called. MD came to room to assess patient and patient had returned to baseline. Code stroke discontinued. 534ml NS bolus given, maintenance fluid after at 48ml/hr. Pt now alert and oriented, no slurred speech. Will continue to monitor. Ammie Dalton, RN

## 2017-03-08 NOTE — Progress Notes (Signed)
[facility] NAME: Krystal White    MR#:  [id number]  DATE OF BIRTH:  Feb 01, 1925  SUBJECTIVE:  CHIEF COMPLAINT:   Chief Complaint  Patient presents with  . Shortness of Breath  Rapid response called on patient for ?  CVA, patient evaluated in her room with support staff present, patient had syncopal episode while up, brief period of loss of consciousness, patient evaluated by myself-deemed not to have acute CVA, symptomatology more consistent with acute syncopal episode from dehydration, noted sinus tachycardia with poor skin turgor, dry mucous membranes, patient ordered to have IV fluids rehydration and conservative monitoring  REVIEW OF SYSTEMS:  CONSTITUTIONAL: No fever, fatigue or weakness.  EYES: No blurred or double vision.  EARS, NOSE, AND THROAT: No tinnitus or ear pain.  RESPIRATORY: No cough, shortness of breath, wheezing or hemoptysis.  CARDIOVASCULAR: No chest pain, orthopnea, edema.  GASTROINTESTINAL: No nausea, vomiting, diarrhea or abdominal pain.  GENITOURINARY: No dysuria, hematuria.  ENDOCRINE: No polyuria, nocturia,  HEMATOLOGY: No anemia, easy bruising or bleeding SKIN: No rash or lesion. MUSCULOSKELETAL: No joint pain or arthritis.   NEUROLOGIC: No tingling, numbness, weakness.  PSYCHIATRY: No anxiety or depression.   ROS  DRUG ALLERGIES:  No Known Allergies  VITALS:  Blood pressure (!) 133/49, pulse (!) 107, temperature 97.7 F (36.5 C), temperature source Oral, resp. rate 16, height 5\' 1"  (1.549 m), weight 50.8 kg (112 lb 1 oz), SpO2 99 %.  PHYSICAL EXAMINATION:  GENERAL:  84 y.o.-year-old patient lying in the bed with no acute distress.  EYES: Pupils equal, round, reactive to light and accommodation. No scleral icterus. Extraocular muscles intact.  Frail-appearing HEENT: Head atraumatic, normocephalic. Oropharynx and nasopharynx clear.  Dry mucous membranes NECK:  Supple, no jugular venous distention.  No thyroid enlargement, no tenderness.  Poor skin turgor LUNGS: Normal breath sounds bilaterally, no wheezing, rales,rhonchi or crepitation. No use of accessory muscles of respiration.  CARDIOVASCULAR: S1, S2 normal. No murmurs, rubs, or gallops.  ABDOMEN: Soft, nontender, nondistended. Bowel sounds present. No organomegaly or mass.  EXTREMITIES: No pedal edema, cyanosis, or clubbing.  NEUROLOGIC: Cranial nerves II through XII are intact.  Normal motor function, no pronator drift, tongue is midline sensation intact. Gait not checked.  PSYCHIATRIC: The patient is alert and oriented x 3.  SKIN: No obvious rash, lesion, or ulcer.   Physical Exam LABORATORY PANEL:   CBC Recent Labs  Lab 03/02/17 1619  WBC 6.3  HGB 13.0  HCT 38.9  PLT 193   ------------------------------------------------------------------------------------------------------------------  Chemistries  Recent Labs  Lab 03/05/17 0418  NA 135  K 4.4  CL 96*  CO2 31  GLUCOSE 102*  BUN 18  CREATININE 0.97  CALCIUM 8.6*  MG 1.9   ------------------------------------------------------------------------------------------------------------------  Cardiac Enzymes Recent Labs  Lab 03/02/17 1619  TROPONINI 0.06*   ------------------------------------------------------------------------------------------------------------------  RADIOLOGY:  No results found.  ASSESSMENT AND PLAN:  [name] a84 y.o.femalewith a known history of congestive heart failure diastolic, hypertension, chronic A. fib on oral anticoagulation comes to the emergency room from home after she started having increasing shortness of breath.Her physical therapist came to evaluate her told her that she had abnormal vital signs needed to go to the hospital. Patient sats were 82% on room air. She is 98% on 2 L nasal cannula oxygen. She has elevated BNP and her chest x-ray is consistent with congestive heart failure.   1 acute  syncopal episode Most likely secondary to acute dehydration given  tachycardia, profound dry mucous membranes, poor skin turgor, orthostasis/loss of consciousness while upright with return to baseline with recumbency IV fluids for rehydration, encourage p.o. intake fluids for today with caution given history of congestive heart failure  2 acute on chronic diastolic congestive heart failure, LV EF: 55% - 60% Resolved Patient was placed on our CHF protocol, treated with IV Lasix which was converted to p.o., patient with continued O2 requirement -has qualified for home oxygen-2 L via nasal cannula continuous, repeat chest x-ray noted for resolution of congestive heart failure/edema, will f/u with congestive heart failure clinic s/p d/c for re-eval,  for SNF placement on tomorrow barring any complications  3acute pleural effusions, bilaterally Critical care attending/pulmonology did not recommend thoracentesis-recommended outpatient follow-up with chest x-ray and treatment of congestive heart failure per above  4 acute on chronic respiratory failure with hypoxia due to CHF/pulmonary fibrosis/COPD Stable Plan of care as stated above Home oxygen as stated above - 2 L via nasal cannula continuous, 88% on room air/with activity, required 2 L to maintain 92% saturation Radiographic studies noted for fibrotic change  5 paroxysmal A. fib with RVR Rate controlled currently Cardiologydid see patient while in house-decreasedamiodarone to 200 mg daily,continue Lopressor to 12.5 mg bid, Eliquis 2.5 mg bidas perDr. End.  6 acute hypokalemia Repleted  7 acute elevated troponin Most likely due to congestive heart failure/demand ischemia ContinuedEliquis.  Disposition to SNF arranged for on Monday Critical care time of 45 minutes regarding rapid response and reevaluation in the  All the records are reviewed and case discussed with Care Management/Social Workerr. Management plans discussed  with the patient, family and they are in agreement.  CODE STATUS: dnr    TOTAL TIME TAKING CARE OF THIS PATIENT: 45 minutes.     POSSIBLE D/C IN 1-2 DAYS, DEPENDING ON CLINICAL CONDITION.   Avel Peace Gar Glance M.D on 03/08/2017   Between 7am to 6pm - Pager - [phone]  After 6pm go to [email or web address] - password [email or web address] [facility]  Office  [phone]  CC: Primary care physician; Derinda Late, MD  Note: This dictation was prepared with Dragon dictation along with smaller phrase technology. Any transcriptional errors that result from this process are unintentional.

## 2017-03-09 ENCOUNTER — Ambulatory Visit: Payer: Medicare Other | Admitting: Family

## 2017-03-09 LAB — CREATININE, SERUM
CREATININE: 0.99 mg/dL (ref 0.44–1.00)
GFR, EST AFRICAN AMERICAN: 56 mL/min — AB (ref >60)
GFR, EST NON AFRICAN AMERICAN: 49 mL/min — AB (ref >60)

## 2017-03-09 NOTE — Discharge Summary (Signed)
[facility] NAME: Adelie Croswell    MR#:  [id number]  DATE OF BIRTH:  [birth year]  DATE OF ADMISSION:  03/02/2017 ADMITTING PHYSICIAN: Fritzi Mandes, MD  DATE OF DISCHARGE: No discharge date for patient encounter.  PRIMARY CARE PHYSICIAN: Derinda Late, MD    ADMISSION DIAGNOSIS:  Hypokalemia [E87.6] Sinus bradycardia [R00.1] Prolonged Q-T interval on ECG [R94.31] Hypoxia [R09.02] Elevated troponin [R74.8] Bilateral pleural effusion [J90] Acute on chronic congestive heart failure, unspecified heart failure type (HCC) [I50.9]  DISCHARGE DIAGNOSIS:  Active Problems:   CHF (congestive heart failure) ([place])   SECONDARY DIAGNOSIS:   Past Medical History:  Diagnosis Date  . Hypertension   . Osteoporosis   . PAF (paroxysmal atrial fibrillation) ([place])   . Pulmonary hypertension ([place])     HOSPITAL COURSE:   [name] a84 y.o.femalewith a known history of congestive heart failure diastolic, hypertension, chronic A. fib on oral anticoagulation comes to the emergency room from home after she started having increasing shortness of breath.Her physical therapist came to evaluate her told her that she had abnormal vital signs needed to go to the hospital. Patient sats were 82% on room air. She is 98% on 2 L nasal cannula oxygen. She has elevated BNP and her chest x-ray is consistent with congestive heart failure.   1. Acute on chronic diastolic congestive heart failure, LV EF: 55% - 60% Resolved Patient was placed on our congestive heart failure protocol, treated with IV Lasix which was converted to p.o., patient with continued O2 requirement on day of discharge-we will continue with home oxygen status post discharge 2 L continuous, chest x-ray on discharge noted for resolution of congestive heart failure/edema, to follow-up with congestive heart failure clinic status post discharge for  reevaluation  2.Pleural effusions, bilaterally Critical care attending/pulmonology did not recommend thoracentesis-recommended outpatient follow-up with chest x-ray and treatment of congestive heart failure per above  3.Acute respiratory failure with hypoxia due to CHF Stable Plan of care as stated above-patient was discharged home on home oxygen 2 L via nasal cannula continuous, 88% on room air/with activity, required 2 L to maintain 92% saturation  4.Proximal A. fib with RVR Cardiology did see patient while in house-decreasedamiodarone to 200 mg daily,Lopressor to 12.5 mg bid, Eliquis 2.5 mg bidas perDr. End.  5. Hypokalemia Repleted  6.Elevated troponin Most likely due to congestive heart failure/demand ischemia ContinuedEliquis.  7.  Acute syncope Secondary to dehydration while in hospital Resolved with IV fluids for rehydration     DISCHARGE CONDITIONS:   On day of discharge patient is afebrile, and that was stable, tolerating diet, ready for discharge to SNF for further evaluation/care, for more specific details please see chart  CONSULTS OBTAINED:    DRUG ALLERGIES:  No Known Allergies  DISCHARGE MEDICATIONS:   Allergies as of 03/09/2017   No Known Allergies     Medication List    STOP taking these medications   metoprolol succinate 100 MG 24 hr tablet Commonly known as:  TOPROL-XL     TAKE these medications   albuterol 108 (90 Base) MCG/ACT inhaler Commonly known as:  PROVENTIL HFA;VENTOLIN HFA Inhale 2 puffs into the lungs every 6 (six) hours as needed for wheezing or shortness of breath.   amiodarone 200 MG tablet Commonly known as:  PACERONE Take 1 tablet (200 mg total) by mouth daily. What changed:  when to take this   apixaban 2.5 MG Tabs tablet Commonly known as:  ELIQUIS  Take 1 tablet (2.5 mg total) by mouth 2 (two) times daily.   budesonide-formoterol 160-4.5 MCG/ACT inhaler Commonly known as:  SYMBICORT Inhale 2 puffs  into the lungs 2 (two) times daily.   fluticasone 50 MCG/ACT nasal spray Commonly known as:  FLONASE Place 2 sprays into the nose daily.   furosemide 40 MG tablet Commonly known as:  LASIX Take 1 tablet (40 mg total) by mouth daily.   metoprolol tartrate 25 MG tablet Commonly known as:  LOPRESSOR Take 0.5 tablets (12.5 mg total) by mouth 2 (two) times daily.   simvastatin 20 MG tablet Commonly known as:  ZOCOR Take 1 tablet by mouth daily.            Durable Medical Equipment  (From admission, onward)        Start     Ordered   03/06/17 1234  DME Oxygen  Once    Question Answer Comment  Mode or (Route) Nasal cannula   Liters per Minute 2   Frequency Continuous (stationary and portable oxygen unit needed)   Oxygen conserving device Yes   Oxygen delivery system Gas      03/06/17 1234       DISCHARGE INSTRUCTIONS:    If you experience worsening of your admission symptoms, develop shortness of breath, life threatening emergency, suicidal or homicidal thoughts you must seek medical attention immediately by calling 911 or calling your MD immediately  if symptoms less severe.  You Must read complete instructions/literature along with all the possible adverse reactions/side effects for all the Medicines you take and that have been prescribed to you. Take any new Medicines after you have completely understood and accept all the possible adverse reactions/side effects.   Please note  You were cared for by a hospitalist during your hospital stay. If you have any questions about your discharge medications or the care you received while you were in the hospital after you are discharged, you can call the unit and asked to speak with the hospitalist on call if the hospitalist that took care of you is not available. Once you are discharged, your primary care physician will handle any further medical issues. Please note that NO REFILLS for any discharge medications will be  authorized once you are discharged, as it is imperative that you return to your primary care physician (or establish a relationship with a primary care physician if you do not have one) for your aftercare needs so that they can reassess your need for medications and monitor your lab values.    Today   CHIEF COMPLAINT:   Chief Complaint  Patient presents with  . Shortness of Breath    HISTORY OF PRESENT ILLNESS:   [name] a84 y.o.femalewith a known history of congestive heart failure diastolic, hypertension, chronic A. fib on oral anticoagulation comes to the emergency room from home after she started having increasing shortness of breath.Her physical therapist came to evaluate her told her that she had abnormal vital signs needed to go to the hospital. Patient sats were 82% on room air. She is 98% on 2 L nasal cannula oxygen. She has elevated BNP and her chest x-ray is consistent with congestive heart failure. She received IV Lasix in the ER she is being admitted for acute on chronic congestive heart failure with acute on chronic rapid A. fib with RVR.    VITAL SIGNS:  Blood pressure 127/63, pulse (!) 108, temperature 97.8 F (36.6 C), temperature source Oral, resp. rate 17, height 5'  1" (1.549 m), weight 52.8 kg (116 lb 6.4 oz), SpO2 99 %.  I/O:    Intake/Output Summary (Last 24 hours) at 03/09/2017 1111 Last data filed at 03/08/2017 2115 Gross per 24 hour  Intake 1318 ml  Output 200 ml  Net 1118 ml    PHYSICAL EXAMINATION:  GENERAL:  84 y.o.-year-old patient lying in the bed with no acute distress.  EYES: Pupils equal, round, reactive to light and accommodation. No scleral icterus. Extraocular muscles intact.  HEENT: Head atraumatic, normocephalic. Oropharynx and nasopharynx clear.  NECK:  Supple, no jugular venous distention. No thyroid enlargement, no tenderness.  LUNGS: Normal breath sounds bilaterally, no wheezing, rales,rhonchi or crepitation. No use  of accessory muscles of respiration.  CARDIOVASCULAR: S1, S2 normal. No murmurs, rubs, or gallops.  ABDOMEN: Soft, non-tender, non-distended. Bowel sounds present. No organomegaly or mass.  EXTREMITIES: No pedal edema, cyanosis, or clubbing.  NEUROLOGIC: Cranial nerves II through XII are intact. Muscle strength 5/5 in all extremities. Sensation intact. Gait not checked.  PSYCHIATRIC: The patient is alert and oriented x 3.  SKIN: No obvious rash, lesion, or ulcer.   DATA REVIEW:   CBC Recent Labs  Lab 03/02/17 1619  WBC 6.3  HGB 13.0  HCT 38.9  PLT 193    Chemistries  Recent Labs  Lab 03/05/17 0418 03/09/17 0435  NA 135  --   K 4.4  --   CL 96*  --   CO2 31  --   GLUCOSE 102*  --   BUN 18  --   CREATININE 0.97 0.99  CALCIUM 8.6*  --   MG 1.9  --     Cardiac Enzymes Recent Labs  Lab 03/02/17 1619  TROPONINI 0.06*    Microbiology Results  Results for orders placed or performed during the hospital encounter of 03/02/17  Blood culture (routine x 2)     Status: None   Collection Time: 03/02/17  4:19 PM  Result Value Ref Range Status   Specimen Description BLOOD LEFT ANTECUBITAL  Final   Special Requests   Final    BOTTLES DRAWN AEROBIC AND ANAEROBIC Blood Culture adequate volume   Culture   Final    NO GROWTH 5 DAYS Performed at [facility], [address], [place], [place] [address]    Report Status 03/07/2017 FINAL  Final  Blood culture (routine x 2)     Status: None   Collection Time: 03/02/17  4:19 PM  Result Value Ref Range Status   Specimen Description BLOOD BLOOD LEFT ARM  Final   Special Requests   Final    BOTTLES DRAWN AEROBIC AND ANAEROBIC Blood Culture adequate volume   Culture   Final    NO GROWTH 5 DAYS Performed at [facility], [address], [place], [place] [address]    Report Status 03/07/2017 FINAL  Final    RADIOLOGY:  No results found.  EKG:   Orders placed or performed during the hospital encounter of  03/02/17  . ED EKG  . ED EKG      Management plans discussed with the patient, family and they are in agreement.  CODE STATUS:     Code Status Orders  (From admission, onward)        Start     Ordered   03/02/17 2025  Do not attempt resuscitation (DNR)  Continuous    Question Answer Comment  In the event of cardiac or respiratory ARREST Do not call a "code blue"  In the event of cardiac or respiratory ARREST Do not perform Intubation, CPR, defibrillation or ACLS   In the event of cardiac or respiratory ARREST Use medication by any route, position, wound care, and other measures to relive pain and suffering. May use oxygen, suction and manual treatment of airway obstruction as needed for comfort.      03/02/17 2024    Code Status History    Date Active Date Inactive Code Status Order ID Comments User Context   02/02/2017 11:46 02/07/2017 14:57 DNR [id number]  [name], MD Inpatient    Advance Directive Documentation     Most Recent Value  Type of Advance Directive  Healthcare Power of Attorney, Living will  Pre-existing out of facility DNR order (yellow form or pink MOST form)  No data  "MOST" Form in Place?  No data      TOTAL TIME TAKING CARE OF THIS PATIENT: 45 minutes.    Avel Peace Deontray Hunnicutt M.D on 03/09/2017 at 11:11 AM  Between 7am to 6pm - Pager - [phone]  After 6pm go to [email or web address] - password [email or web address] [facility]  Office  [phone]  CC: Primary care physician; Derinda Late, MD   Note: This dictation was prepared with Dragon dictation along with smaller phrase technology. Any transcriptional errors that result from this process are unintentional.

## 2017-03-09 NOTE — Clinical Social Work Note (Addendum)
Patient to be d/c'ed today to [facility] room 208B.  Patient and family agreeable to plans will transport via ems RN to call report to [phone].  CSW spoke to patient's son Gwyndolyn Saxon, and updated him that she will be discharging today.  Evette Cristal, MSW, [place]

## 2017-03-09 NOTE — Discharge Instructions (Signed)

## 2017-03-09 NOTE — Progress Notes (Signed)
Pt discharged via non-emergent [facility] to [facility]. Report called to accepting RN. IV removed, pt on 2L 02 and in no distress. Ammie Dalton, RN

## 2017-03-16 ENCOUNTER — Ambulatory Visit: Payer: Medicare Other | Admitting: Family

## 2017-03-16 NOTE — Progress Notes (Deleted)
   Patient ID: Krystal White, female    DOB: 09-08-1924, 84 y.o.   MRN: [id number]  HPI  Krystal White is a 84 y/o female with a history of HTN, atrial fibrillation, osteoporosis and chronic heart failure.   Echo report from 02/02/17 reviewed and showed an EF of 55-60% along with moderate TR, mild AR/MR and severely elevated PA pressure of 65 mm Hg.  Admitted 1/71/9 due to HF exacerbation. Cardiology consult obtained. Initially needed IV diuretics and then transitioned to oral diuretics. Medications adjusted and she was discharged to SNF after 5 day. Admitted 02/02/17 due to acute HF along with AF with RVR. Initially needed IV diuretics along with IV cardizem and IV metoprolol. Cardiology consult obtained. Oral medications were adjusted. Discharged home after 5 days.   She presents today for a follow-up visit with a chief complaint of    Past Medical History:  Diagnosis Date  . Hypertension   . Osteoporosis   . PAF (paroxysmal atrial fibrillation) ([place])   . Pulmonary hypertension ([place])    No past surgical history on file. Family History  Problem Relation Age of Onset  . Heart failure Sister    Social History   Tobacco Use  . Smoking status: Never Smoker  . Smokeless tobacco: Never Used  Substance Use Topics  . Alcohol use: No   No Known Allergies   Review of Systems  Constitutional: Positive for fatigue. Negative for appetite change.  HENT: Positive for hearing loss and rhinorrhea. Negative for congestion and sore throat.   Eyes: Negative.   Respiratory: Positive for cough and shortness of breath ("little bit"). Negative for chest tightness.   Cardiovascular: Positive for palpitations. Negative for chest pain and leg swelling.  Gastrointestinal: Negative for abdominal distention and abdominal pain.  Endocrine: Negative.   Genitourinary: Negative.   Musculoskeletal: Negative for back pain and neck pain.  Skin: Negative.   Allergic/Immunologic: Negative.   Neurological:  Positive for light-headedness ("unsteady"). Negative for dizziness.  Hematological: Negative for adenopathy. Does not bruise/bleed easily.  Psychiatric/Behavioral: Positive for sleep disturbance (sleeping much during the day/awake at night). Negative for dysphoric mood. The patient is not nervous/anxious.    Assessment & Plan:  1: Chronic heart failure with preserved ejection fraction- - NYHA class II - euvolemic today - currently not weighing daily but does have some scales. Discussed the importance of weighing daily and to call for an overnight weight gain of >2 pounds or a weekly weight gain of >5 pounds - not adding salt and has been reading food labels for years. Reviewed the importance of closely following a 2000mg  sodium diet and written dietary information was given to her about this. - encouraged her to elevate her legs when she sits for long periods of time - BNP from 03/02/17 was 1577.0  2: HTN- - BP looks good today - saw PCP Baldemar Lenis) 12/23/16 - BMP from 03/05/17 reviewed and showed sodium 135, potassium 4.4 and GFR 50  3: Atrial fibrillation- - currently rate controlled at this time - currently on amiodarone, metoprolol succinate and apixaban - hasn't noticed any increase in bleeding/bruising - did not have cardiology follow-up so an appointment was made with Dr. Fletcher Anon for   Patient did not bring her medications nor a list. Each medication was verbally reviewed with the patient and she was encouraged to bring the bottles to every visit to confirm accuracy of list.

## 2017-03-19 ENCOUNTER — Encounter: Payer: Self-pay | Admitting: Gerontology

## 2017-03-19 ENCOUNTER — Non-Acute Institutional Stay (SKILLED_NURSING_FACILITY): Payer: Medicare Other | Admitting: Gerontology

## 2017-03-19 DIAGNOSIS — I5032 Chronic diastolic (congestive) heart failure: Secondary | ICD-10-CM

## 2017-03-19 NOTE — Progress Notes (Signed)
Location:   [facility] Room Number: 208B Place of Service:  SNF [phone]) Provider:  Toni Arthurs, NP-C  Derinda Late, MD  Patient Care Team: Derinda Late, MD as PCP - General (Family Medicine) Wellington Hampshire, MD as PCP - Cardiology (Cardiology) Wellington Hampshire, MD as Consulting Physician (Cardiology) Alisa Graff, FNP as Nurse Practitioner (Family Medicine)  Extended Emergency Contact Information Primary Emergency Contact: Casimer Leek Address: [place], [place] [address] Home Phone: [phone] Relation: None  Code Status:  DNR Goals of care: Advanced Directive information Advanced Directives 03/19/2017  Does Patient Have a Medical Advance Directive? Yes  Type of Advance Directive Out of facility DNR (pink MOST or yellow form)  Does patient want to make changes to medical advance directive? No - Patient declined  Copy of [place] in Chart? No - copy requested  Would patient like information on creating a medical advance directive? -     Chief Complaint  Patient presents with  . Medical Management of Chronic Issues    Routine Visit    HPI:  Pt is a 84 y.o. female seen today for medical management of chronic diseases. Pt was admitted to the facility for rehab following hospitalization at [facility] for Hypoxia d/t CHF exacerbation. She has been participating in PT/OT. Sats WNL on O2 2L [place], 83% on RA/ pt removed oxygen. Intermittent confusion at times. Pt reports she does not have any pain and feels that her breathing has improved. She reports she is eating well, voiding without difficulty and having regular BMs. Her only concern is if she will be able to drive again after discharge as she helps care for her 69 year old sister, picks up her medications, etc. Very HOH. VSS. No other complaints.      Past Medical History:  Diagnosis Date  . Allergic rhinitis   . Anxiety    unspecified  . Back pain     unspecified  . Cystitis   . Edema   . Gait abnormality   . Hand pain   . Hearing loss    left ear  . Hematuria   . History of atrial fibrillation   . Hyperlipidemia, unspecified   . Hypertension   . Osteoporosis   . Osteoporosis, post-menopausal   . PAF (paroxysmal atrial fibrillation) ([place])   . Polymyalgia ([place])   . Pulmonary hypertension ([place])   . Skin cancer    Past Surgical History:  Procedure Laterality Date  . BREAST BIOPSY    . INGUINAL HERNIA REPAIR Right 2005    No Known Allergies  Allergies as of 03/19/2017   No Known Allergies     Medication List        Accurate as of 03/19/17  1:42 PM. Always use your most recent med list.          albuterol (2.5 MG/3ML) 0.083% nebulizer solution Commonly known as:  PROVENTIL Take 2.5 mg by nebulization every 6 (six) hours as needed for wheezing or shortness of breath.   amiodarone 200 MG tablet Commonly known as:  PACERONE Take 1 tablet (200 mg total) by mouth daily.   apixaban 2.5 MG Tabs tablet Commonly known as:  ELIQUIS Take 1 tablet (2.5 mg total) by mouth 2 (two) times daily.   azelastine 0.1 % nasal spray Commonly known as:  ASTELIN Place 2 sprays into both nostrils 2 (two) times daily. Use in  each nostril as directed   budesonide-formoterol 160-4.5 MCG/ACT inhaler Commonly known as:  SYMBICORT Inhale 2 puffs into the lungs 2 (two) times daily.   fluticasone 50 MCG/ACT nasal spray Commonly known as:  FLONASE Place 2 sprays into the nose daily.   furosemide 40 MG tablet Commonly known as:  LASIX Take 1 tablet (40 mg total) by mouth daily.   metoprolol tartrate 25 MG tablet Commonly known as:  LOPRESSOR Take 0.5 tablets (12.5 mg total) by mouth 2 (two) times daily.   OXYGEN Inhale 2 L into the lungs continuous.   simvastatin 20 MG tablet Commonly known as:  ZOCOR Take 20 mg by mouth daily.       Review of Systems  Constitutional: Negative for activity change, appetite change, chills,  diaphoresis and fever.  HENT: Negative for congestion, mouth sores, nosebleeds, postnasal drip, sneezing, sore throat, trouble swallowing and voice change.   Respiratory: Negative for apnea, cough, choking, chest tightness, shortness of breath and wheezing.   Cardiovascular: Negative for chest pain, palpitations and leg swelling.  Gastrointestinal: Negative for abdominal distention, abdominal pain, constipation, diarrhea and nausea.  Genitourinary: Negative for difficulty urinating, dysuria, frequency and urgency.  Musculoskeletal: Negative for back pain, gait problem and myalgias. Arthralgias: typical arthritis.  Skin: Negative for color change, pallor, rash and wound.  Neurological: Negative for dizziness, tremors, syncope, speech difficulty, weakness, numbness and headaches.  Psychiatric/Behavioral: Negative for agitation and behavioral problems.  All other systems reviewed and are negative.   Immunization History  Administered Date(s) Administered  . Pneumococcal Polysaccharide-23 02/07/1997, 01/02/2012, 06/23/2013  . Zoster 09/22/2007   Pertinent  Health Maintenance Due  Topic Date Due  . DEXA SCAN  11/01/1991  . PNA vac Low Risk Adult (1 of 2 - PCV13) 11/01/1991  . INFLUENZA VACCINE  09/24/2016   Fall Risk  02/13/2017  Falls in the past year? Yes  Number falls in past yr: 1  Injury with Fall? Yes  Risk Factor Category  High Fall Risk   Functional Status Survey:    Vitals:   03/19/17 1323  BP: (!) 118/99  Pulse: 94  Resp: 20  Temp: 97.8 F (36.6 C)  TempSrc: Oral  SpO2: 100%  Weight: 108 lb 11.2 oz (49.3 kg)  Height: 5' 1"  (1.549 m)   Body mass index is 20.54 kg/m. Physical Exam  Constitutional: She is oriented to person, place, and time. Vital signs are normal. She appears well-developed and well-nourished. She is active and cooperative. She does not appear ill. No distress. Nasal cannula in place.  HENT:  Head: Normocephalic and atraumatic.  Mouth/Throat:  Uvula is midline, oropharynx is clear and moist and mucous membranes are normal. Mucous membranes are not pale, not dry and not cyanotic.  Eyes: Conjunctivae, EOM and lids are normal. Pupils are equal, round, and reactive to light.  Neck: Trachea normal, normal range of motion and full passive range of motion without pain. Neck supple. No JVD present. No tracheal deviation, no edema and no erythema present. No thyromegaly present.  Cardiovascular: Normal rate, regular rhythm, normal heart sounds, intact distal pulses and normal pulses. Exam reveals no gallop, no distant heart sounds and no friction rub.  No murmur heard. Pulses:      Dorsalis pedis pulses are 2+ on the right side, and 2+ on the left side.  No edema  Pulmonary/Chest: Effort normal and breath sounds normal. No accessory muscle usage. No respiratory distress. She has no decreased breath sounds. She has no wheezes. She  has no rhonchi. She has no rales. She exhibits no tenderness.  Abdominal: Soft. Normal appearance and bowel sounds are normal. She exhibits no distension and no ascites. There is no tenderness.  Musculoskeletal: Normal range of motion. She exhibits no edema or tenderness.  Expected osteoarthritis, stiffness; Bilateral Calves soft, supple. Negative Homan's Sign. B- pedal pulses equal; generalized weakness and deconditioning  Neurological: She is alert and oriented to person, place, and time. She has normal strength.  Skin: Skin is warm, dry and intact. She is not diaphoretic. No cyanosis. No pallor. Nails show no clubbing.  Psychiatric: She has a normal mood and affect. Her speech is normal and behavior is normal. Judgment and thought content normal. Cognition and memory are impaired.  Nursing note and vitals reviewed.   Labs reviewed: Recent Labs    03/03/17 0435 03/04/17 0333 03/05/17 0418 03/09/17 0435  NA 140 137 135  --   K 3.3* 3.6 4.4  --   CL 99* 96* 96*  --   CO2 33* 34* 31  --   GLUCOSE 98 100* 102*   --   BUN 13 14 18   --   CREATININE 0.94 0.82 0.97 0.99  CALCIUM 8.6* 8.3* 8.6*  --   MG 1.5* 1.9 1.9  --    Recent Labs    02/02/17 0739  AST 49*  ALT 54  ALKPHOS 68  BILITOT 1.1  PROT 6.7  ALBUMIN 2.9*   Recent Labs    02/02/17 0739  02/06/17 0409 02/07/17 0619 03/02/17 1619  WBC 8.8   < > 8.6 8.3 6.3  NEUTROABS 7.0*  --   --   --   --   HGB 12.7   < > 14.2 15.1 13.0  HCT 37.3   < > 42.8 44.9 38.9  MCV 93.8   < > 95.1 94.6 95.6  PLT 299   < > 342 313 193   < > = values in this interval not displayed.   Lab Results  Component Value Date   TSH 1.470 02/04/2017   No results found for: HGBA1C No results found for: CHOL, HDL, LDLCALC, LDLDIRECT, TRIG, CHOLHDL  Significant Diagnostic Results in last 30 days:  Dg Chest 2 View  Result Date: 03/06/2017 CLINICAL DATA:  Shortness of Breath EXAM: CHEST  2 VIEW COMPARISON:  Chest CT March 02, 2017 and chest radiograph February 02, 2017 FINDINGS: There is a degree of underlying fibrotic type change. There is no frank edema or consolidation. Heart is mildly enlarged with pulmonary vascularity within normal limits. There is aortic atherosclerosis. No adenopathy. There is marked collapse of a midthoracic vertebral body, stable. There is underlying osteoporosis. IMPRESSION: Underlying fibrotic type change without edema or consolidation. Stable cardiac silhouette. There is aortic atherosclerosis. Bones osteoporotic with marked wedging of a midthoracic vertebral body, stable. Aortic Atherosclerosis (ICD10-I70.0). Electronically Signed   By: Lowella Grip III M.D.   On: 03/06/2017 09:35   Ct Angio Chest Pe W And/or Wo Contrast  Result Date: 03/02/2017 CLINICAL DATA:  84 year old female with low oxygen saturation. Shortness breath. History of congestive heart failure. Initial encounter. EXAM: CT ANGIOGRAPHY CHEST WITH CONTRAST TECHNIQUE: Multidetector CT imaging of the chest was performed using the standard protocol during bolus  administration of intravenous contrast. Multiplanar CT image reconstructions and MIPs were obtained to evaluate the vascular anatomy. CONTRAST:  36m ISOVUE-370 IOPAMIDOL (ISOVUE-370) INJECTION 76% COMPARISON:  02/03/2017 CT. FINDINGS: Cardiovascular:  No pulmonary embolus. Cardiomegaly.  Coronary artery calcification. Atherosclerotic changes thoracic aorta  without aneurysm. Limited for evaluating for dissection. No obvious dissection noted. Calcified plaque origin great vessels. Mediastinum/Nodes: No mediastinal or hilar adenopathy. Collapsed esophagus appear slightly thickened. Lungs/Pleura: Emphysematous changes with superimposed mild pulmonary vascular congestion. Slight enlargement of bilateral pleural effusions which are slightly loculated and greater on the right. Basilar atelectasis greater on the right. Infectious infiltrate within the right lung base felt to be less likely consideration. Stable right upper lobe 4 mm nodule (series 6, image 38). Scarring lung apices stable. Upper Abdomen: Left lobe liver 2.3 cm cyst suspected without change. Tiny right lobe liver hypodensities too small to characterize, without change and may represent cysts. Left renal 9 mm hyperdense lesion probably a hyperdense cyst. Hyperplasia adrenal glands. Atherosclerotic changes of of abdominal aorta and origin of the celiac/ superior mesenteric artery. Musculoskeletal: Compression fracture T8 with anterior wedge compression deformity with 90% loss of height centrally and slight retropulsion posterior inferior aspect contacting the ventral cord unchanged. Remote T3 and T4 superior endplate compression fracture. Review of the MIP images confirms the above findings. IMPRESSION: No pulmonary embolus noted. Emphysematous changes with superimposed mild pulmonary vascular congestion. Mild enlargement of bilateral pleural effusions which are slightly loculated and greater on the right. Basilar atelectasis greater on the right. Infectious  infiltrate within the right lung base felt to be less likely consideration. Stable right upper lobe 4 mm nodule (series 6, image 38). No follow-up needed if patient is low-risk. Non-contrast chest CT can be considered in 12 months if patient is high-risk. This recommendation follows the consensus statement: Guidelines for Management of Incidental Pulmonary Nodules Detected on CT Images: From the Fleischner Society 2017; Radiology 2017; 284:228-243. Cardiomegaly.  Coronary artery calcifications. Similar appearance of T8 compression fracture mild retropulsion. No significant change T3 and T4 superior endplate compression fracture. Aortic Atherosclerosis (ICD10-I70.0). Emphysema (ICD10-J43.9). Electronically Signed   By: Genia Del M.D.   On: 03/02/2017 17:53    Assessment/Plan Chronic diastolic congestive heart failure ([place])  Continue working with PT/OT  Continue exercises as taught by PT/OT  Continue O2 2L [place]  Continue Lasix 40 mg po Q Day  Recheck labs next week  Continue daily weights  CSW, ensure safe discharge plan as pt is concerned about driving and sister's care  Follow up with Cardiologist as instructed  Family/ staff Communication:   Total Time:  Documentation:  Face to Face:  Family/Phone:   Labs/tests ordered:  CBC, Met C on Tuesday   Medication list reviewed and assessed for continued appropriateness. Monthly medication orders reviewed and signed.  Vikki Ports, NP-C Geriatrics [facility] [phone], [place] [address] Cell Phone (Mon-Fri 8am-5pm):  [phone] On Call:  [phone] & follow prompts after 5pm & weekends Office Phone:  [phone] Office Fax:  [phone]

## 2017-03-20 NOTE — Progress Notes (Signed)
Patient ID: Krystal White, female    DOB: April 06, 1924, 84 y.o.   MRN: [id number]  HPI  Krystal White is a 84 y/o female with a history of HTN, atrial fibrillation, osteoporosis and chronic heart failure.   Echo report from 02/02/17 reviewed and showed an EF of 55-60% along with moderate TR, mild AR/MR and severely elevated PA pressure of 65 mm Hg.  Admitted 03/02/17 due to HF exacerbation. Cardiology consult obtained. Initially needed IV diuretics and then transitioned to oral diuretics. Medications adjusted and she was discharged to SNF after 5 day. Admitted 02/02/17 due to acute HF along with AF with RVR. Initially needed IV diuretics along with IV cardizem and IV metoprolol. Cardiology consult obtained. Oral medications were adjusted. Discharged home after 5 days.   She presents today for a follow-up visit with a chief complaint of minimal fatigue upon moderate exertion. She says this has been present for many years with varying levels of severity. She has associated cough, edema and difficulty sleeping. She denies any chest pain, shortness of breath, palpitations, abdominal distention, dizziness or weight gain. Says that she is supposed to go home on 03/26/17.  Past Medical History:  Diagnosis Date  . Allergic rhinitis   . Anxiety    unspecified  . Back pain    unspecified  . Cystitis   . Edema   . Gait abnormality   . Hand pain   . Hearing loss    left ear  . Hematuria   . History of atrial fibrillation   . Hyperlipidemia, unspecified   . Hypertension   . Osteoporosis   . Osteoporosis, post-menopausal   . PAF (paroxysmal atrial fibrillation) ([place])   . Polymyalgia ([place])   . Pulmonary hypertension ([place])   . Skin cancer    Past Surgical History:  Procedure Laterality Date  . BREAST BIOPSY    . INGUINAL HERNIA REPAIR Right 2005   Family History  Problem Relation Age of Onset  . Heart failure Sister   . Cancer Sister   . Aneurysm Mother   . Colon cancer Father   . Heart  attack Brother   . Cancer Sister    Social History   Tobacco Use  . Smoking status: Former Smoker    Packs/day: 1.00    Years: 25.00    Pack years: 25.00    Types: Cigarettes    Last attempt to quit: 02/24/1978    Years since quitting: 39.0  . Smokeless tobacco: Never Used  Substance Use Topics  . Alcohol use: No   No Known Allergies  Prior to Admission medications   Medication Sig Start Date End Date Taking? Authorizing Provider  acetaminophen (ACETAMINOPHEN 8 HOUR) 650 MG CR tablet Take 650 mg by mouth every 8 (eight) hours as needed for pain.   Yes [provider]  albuterol (PROVENTIL) (2.5 MG/3ML) 0.083% nebulizer solution Take 2.5 mg by nebulization every 6 (six) hours as needed for wheezing or shortness of breath.   Yes [provider]  amiodarone (PACERONE) 200 MG tablet Take 1 tablet (200 mg total) by mouth daily. 03/07/17  Yes Salary, Avel Peace, MD  apixaban (ELIQUIS) 2.5 MG TABS tablet Take 1 tablet (2.5 mg total) by mouth 2 (two) times daily. 02/07/17  Yes Sainani, Belia Heman, MD  azelastine (ASTELIN) 0.1 % nasal spray Place 2 sprays into both nostrils 2 (two) times daily. Use in each nostril as directed   Yes [provider]  budesonide-formoterol (SYMBICORT) 160-4.5 MCG/ACT inhaler  Inhale 2 puffs into the lungs 2 (two) times daily. 03/06/17  Yes Salary, Avel Peace, MD  Dextromethorphan Polistirex (DELSYM PO) Take 5 mLs by mouth daily.   Yes [provider]  fluticasone (FLONASE) 50 MCG/ACT nasal spray Place 2 sprays into the nose daily.  12/23/16 12/23/17 Yes [provider]  furosemide (LASIX) 40 MG tablet Take 1 tablet (40 mg total) by mouth daily. 03/07/17  Yes Salary, Avel Peace, MD  metoprolol tartrate (LOPRESSOR) 25 MG tablet Take 0.5 tablets (12.5 mg total) by mouth 2 (two) times daily. 03/06/17  Yes Salary, Avel Peace, MD  OXYGEN Inhale 2 L into the lungs continuous.    [provider]  simvastatin (ZOCOR) 20 MG tablet  Take 20 mg by mouth daily.    [provider]    Review of Systems  Constitutional: Positive for fatigue. Negative for appetite change.  HENT: Positive for rhinorrhea. Negative for congestion and sore throat.   Eyes: Negative.   Respiratory: Positive for cough (dry cough). Negative for chest tightness and shortness of breath.   Cardiovascular: Positive for leg swelling. Negative for chest pain and palpitations.  Gastrointestinal: Negative for abdominal distention and abdominal pain.  Endocrine: Negative.   Genitourinary: Negative.   Musculoskeletal: Negative for back pain and neck pain.  Skin: Negative.   Allergic/Immunologic: Negative.   Neurological: Negative for dizziness and light-headedness.  Hematological: Negative for adenopathy. Does not bruise/bleed easily.  Psychiatric/Behavioral: Positive for sleep disturbance (sleeping more during the day). Negative for dysphoric mood. The patient is not nervous/anxious.    Vitals:   03/23/17 0858  BP: (!) 154/50  Pulse: 82  Resp: 18  SpO2: 97%  Weight: 111 lb (50.3 kg)  Height: 5\' 1"  (1.549 m)   Wt Readings from Last 3 Encounters:  03/23/17 111 lb (50.3 kg)  03/19/17 108 lb 11.2 oz (49.3 kg)  03/09/17 116 lb 6.4 oz (52.8 kg)   Lab Results  Component Value Date   CREATININE 0.99 03/09/2017   CREATININE 0.97 03/05/2017   CREATININE 0.82 03/04/2017   Physical Exam  Constitutional: She is oriented to person, place, and time. She appears well-developed and well-nourished.  HENT:  Head: Normocephalic and atraumatic.  Neck: Normal range of motion. Neck supple. No JVD present.  Cardiovascular: Normal rate and regular rhythm.  Pulmonary/Chest: Effort normal. She has no wheezes. She has no rales.  Abdominal: Soft. She exhibits no distension. There is no tenderness.  Musculoskeletal: She exhibits edema (trace edema around ankles). She exhibits no tenderness.  Neurological: She is alert and oriented to person, place, and  time.  Skin: Skin is warm and dry.  Psychiatric: She has a normal mood and affect. Her behavior is normal. Thought content normal.  Nursing note and vitals reviewed.  Assessment & Plan:  1: Chronic heart failure with preserved ejection fraction- - NYHA class II - euvolemic today - being [facility] and is supposed to go home in a few days. Discussed the importance of weighing daily and to call for an overnight weight gain of >2 pounds or a weekly weight gain of >5 pounds - scales given to her today  - not adding salt and has been reading food labels for years. Reviewed the importance of closely following a 2000mg  sodium diet - encouraged her to elevate her legs when she sits for long periods of time - BNP from 03/02/17 was 1577.0 - she reports receiving her flu vaccine for this season - aide with  her says that the Education officer, museum at [place] is supposed to be helping patient set up transportation when she goes back home - patient says that she'll have a home health nurse for a short period of time when she goes home  2: HTN- - BP looks good today - saw PCP Baldemar Lenis) 12/23/16 - BMP from 03/05/17 reviewed and showed sodium 135, potassium 4.4 and GFR 50  3: Atrial fibrillation- - currently rate controlled at this time - currently on amiodarone, metoprolol succinate and apixaban - hasn't noticed any increase in bleeding/bruising - sees cardiology Fletcher Anon) 04/07/17  Facility medication list was reviewed.  Return in 3 months or sooner for any questions/problems before then.

## 2017-03-23 ENCOUNTER — Encounter: Payer: Self-pay | Admitting: Family

## 2017-03-23 ENCOUNTER — Ambulatory Visit: Payer: Medicare Other | Attending: Family | Admitting: Family

## 2017-03-23 VITALS — BP 154/50 | HR 82 | Resp 18 | Ht 61.0 in | Wt 111.0 lb

## 2017-03-23 DIAGNOSIS — Z87891 Personal history of nicotine dependence: Secondary | ICD-10-CM | POA: Insufficient documentation

## 2017-03-23 DIAGNOSIS — R5383 Other fatigue: Secondary | ICD-10-CM | POA: Diagnosis present

## 2017-03-23 DIAGNOSIS — I272 Pulmonary hypertension, unspecified: Secondary | ICD-10-CM | POA: Diagnosis not present

## 2017-03-23 DIAGNOSIS — M353 Polymyalgia rheumatica: Secondary | ICD-10-CM | POA: Diagnosis not present

## 2017-03-23 DIAGNOSIS — E785 Hyperlipidemia, unspecified: Secondary | ICD-10-CM | POA: Diagnosis not present

## 2017-03-23 DIAGNOSIS — M810 Age-related osteoporosis without current pathological fracture: Secondary | ICD-10-CM | POA: Insufficient documentation

## 2017-03-23 DIAGNOSIS — I110 Hypertensive heart disease with heart failure: Secondary | ICD-10-CM | POA: Insufficient documentation

## 2017-03-23 DIAGNOSIS — Z7901 Long term (current) use of anticoagulants: Secondary | ICD-10-CM | POA: Diagnosis not present

## 2017-03-23 DIAGNOSIS — F419 Anxiety disorder, unspecified: Secondary | ICD-10-CM | POA: Insufficient documentation

## 2017-03-23 DIAGNOSIS — I5032 Chronic diastolic (congestive) heart failure: Secondary | ICD-10-CM | POA: Insufficient documentation

## 2017-03-23 DIAGNOSIS — I10 Essential (primary) hypertension: Secondary | ICD-10-CM

## 2017-03-23 DIAGNOSIS — Z79899 Other long term (current) drug therapy: Secondary | ICD-10-CM | POA: Insufficient documentation

## 2017-03-23 DIAGNOSIS — Z85828 Personal history of other malignant neoplasm of skin: Secondary | ICD-10-CM | POA: Diagnosis not present

## 2017-03-23 DIAGNOSIS — I480 Paroxysmal atrial fibrillation: Secondary | ICD-10-CM | POA: Insufficient documentation

## 2017-03-23 NOTE — Patient Instructions (Signed)
Continue weighing daily and call for an overnight weight gain of > 2 pounds or a weekly weight gain of >5 pounds. 

## 2017-03-24 ENCOUNTER — Other Ambulatory Visit
Admission: RE | Admit: 2017-03-24 | Discharge: 2017-03-24 | Disposition: A | Discharge status: Home / Self Care | Payer: Medicare Other | Source: Ambulatory Visit | Attending: Gerontology | Admitting: Gerontology

## 2017-03-24 DIAGNOSIS — I110 Hypertensive heart disease with heart failure: Secondary | ICD-10-CM | POA: Insufficient documentation

## 2017-03-24 LAB — CBC WITH DIFFERENTIAL/PLATELET
Basophils Absolute: 0.1 10*3/uL (ref 0–0.1)
Basophils Relative: 1 %
EOS ABS: 0.2 10*3/uL (ref 0–0.7)
EOS PCT: 2 %
HCT: 36.8 % (ref 35.0–47.0)
HEMOGLOBIN: 12.3 g/dL (ref 12.0–16.0)
LYMPHS ABS: 1.6 10*3/uL (ref 1.0–3.6)
Lymphocytes Relative: 19 %
MCH: 31.8 pg (ref 26.0–34.0)
MCHC: 33.5 g/dL (ref 32.0–36.0)
MCV: 95.2 fL (ref 80.0–100.0)
MONOS PCT: 10 %
Monocytes Absolute: 0.8 10*3/uL (ref 0.2–0.9)
NEUTROS PCT: 68 %
Neutro Abs: 6 10*3/uL (ref 1.4–6.5)
Platelets: 209 10*3/uL (ref 150–440)
RBC: 3.86 MIL/uL (ref 3.80–5.20)
RDW: 15.5 % — ABNORMAL HIGH (ref 11.5–14.5)
WBC: 8.6 10*3/uL (ref 3.6–11.0)

## 2017-03-24 LAB — COMPREHENSIVE METABOLIC PANEL
ALK PHOS: 47 U/L (ref 38–126)
ALT: 22 U/L (ref 14–54)
ANION GAP: 9 (ref 5–15)
AST: 22 U/L (ref 15–41)
Albumin: 3.1 g/dL — ABNORMAL LOW (ref 3.5–5.0)
BUN: 23 mg/dL — ABNORMAL HIGH (ref 6–20)
CALCIUM: 9 mg/dL (ref 8.9–10.3)
CO2: 36 mmol/L — AB (ref 22–32)
Chloride: 95 mmol/L — ABNORMAL LOW (ref 101–111)
Creatinine, Ser: 1.2 mg/dL — ABNORMAL HIGH (ref 0.44–1.00)
GFR calc non Af Amer: 39 mL/min — ABNORMAL LOW (ref >60)
GFR, EST AFRICAN AMERICAN: 45 mL/min — AB (ref >60)
Glucose, Bld: 84 mg/dL (ref 65–99)
Potassium: 3.2 mmol/L — ABNORMAL LOW (ref 3.5–5.1)
SODIUM: 140 mmol/L (ref 135–145)
TOTAL PROTEIN: 6 g/dL — AB (ref 6.5–8.1)
Total Bilirubin: 0.6 mg/dL (ref 0.3–1.2)

## 2017-03-26 ENCOUNTER — Non-Acute Institutional Stay (SKILLED_NURSING_FACILITY): Payer: Medicare Other | Admitting: Gerontology

## 2017-03-26 ENCOUNTER — Encounter: Payer: Self-pay | Admitting: Gerontology

## 2017-03-26 DIAGNOSIS — E46 Unspecified protein-calorie malnutrition: Secondary | ICD-10-CM

## 2017-03-26 DIAGNOSIS — F0150 Vascular dementia without behavioral disturbance: Secondary | ICD-10-CM | POA: Diagnosis not present

## 2017-03-26 DIAGNOSIS — I5032 Chronic diastolic (congestive) heart failure: Secondary | ICD-10-CM | POA: Diagnosis not present

## 2017-03-27 ENCOUNTER — Encounter: Admission: RE | Admit: 2017-03-27 | Payer: Medicare Other | Source: Ambulatory Visit | Admitting: Internal Medicine

## 2017-03-27 ENCOUNTER — Encounter: Payer: Self-pay | Admitting: Gerontology

## 2017-03-27 NOTE — Progress Notes (Signed)
Location:   [facility] Room Number:   Place of Service:  SNF (31) Provider:   , NP-C  Babaoff, Marcus, MD  Patient Care Team: Babaoff, Marcus, MD as PCP - General (Family Medicine) Arida, Muhammad A, MD as PCP - Cardiology (Cardiology) Arida, Muhammad A, MD as Consulting Physician (Cardiology) Hackney, Tina A, FNP as Nurse Practitioner (Family Medicine)  Extended Emergency Contact Information Primary Emergency Contact: Sillas,WILLIAM M Address: [address]          [place], [place] [address] Home Phone: [phone] Relation: None  Code Status:  DNR Goals of care: Advanced Directive information Advanced Directives 03/27/2017  Does Patient Have a Medical Advance Directive? Yes  Type of Advance Directive Out of facility DNR (pink MOST or yellow form)  Does patient want to make changes to medical advance directive? No - Patient declined  Copy of Healthcare Power of Attorney in Chart? -  Would patient like information on creating a medical advance directive? -     Chief Complaint  Patient presents with  . Medical Management of Chronic Issues    Routine Visit    HPI:  Pt is a 84 y.o. female seen today for medical management of chronic diseases.  Patient was admitted to the facility for rehab following hospitalization at [facility] for CHF exacerbation.  Patient is participating in PT/OT for strengthening due to weakness and deconditioning.  Patient is able to ambulate short distances via rolling walker.  Patient is able to carry on a conversation, but does have confusion of details at times.  She did explain that she lives across the street from her older sister.  Patient helps to look after her sister, as her sister is unable to drive.  Patient's son is her primary caregiver.  He checks in with her from time to time but cannot stay with her. He lives in [place] and has his own health issues. Home situation is questionable. Pt has had multiple  hospitalizations within the past few months for hypoxia, CHF exacerbations. Pt was found to be hypoxic by her Home Health Physical Therapist and prompted pt go to the ED. Pt acknowledges that she does not eat well a lot of times at home. She is HOH. She is Alert, oriented x 2. A previous MRI from 2015 showed moderately advanced signal changes in the brain most suggestive of chronic small vessel disease. Pt denies pain. Pt denies n/v/d/f/c/cp/sob/ha/abd pain/dizziness/confusion/cough/congestion. VSS. No other complaints.      Past Medical History:  Diagnosis Date  . Allergic rhinitis   . Anxiety    unspecified  . Back pain    unspecified  . Cystitis   . Edema   . Gait abnormality   . Hand pain   . Hearing loss    left ear  . Hematuria   . History of atrial fibrillation   . Hyperlipidemia, unspecified   . Hypertension   . Osteoporosis   . Osteoporosis, post-menopausal   . PAF (paroxysmal atrial fibrillation) (HCC)   . Polymyalgia (HCC)   . Pulmonary hypertension (HCC)   . Skin cancer    Past Surgical History:  Procedure Laterality Date  . BREAST BIOPSY    . INGUINAL HERNIA REPAIR Right 2005    No Known Allergies  Allergies as of 03/26/2017   No Known Allergies     Medication List        Accurate as of 03/26/17 11:59 PM. Always use your most recent med list.            acetaminophen 325 MG tablet Commonly known as:  TYLENOL Take 650 mg by mouth every 4 (four) hours as needed.   albuterol (2.5 MG/3ML) 0.083% nebulizer solution Commonly known as:  PROVENTIL Take 2.5 mg by nebulization every 6 (six) hours as needed for wheezing or shortness of breath.   amiodarone 200 MG tablet Commonly known as:  PACERONE Take 1 tablet (200 mg total) by mouth daily.   apixaban 2.5 MG Tabs tablet Commonly known as:  ELIQUIS Take 1 tablet (2.5 mg total) by mouth 2 (two) times daily.   azelastine 0.1 % nasal spray Commonly known as:  ASTELIN Place 2 sprays into both nostrils 2  (two) times daily. Use in each nostril as directed   budesonide-formoterol 160-4.5 MCG/ACT inhaler Commonly known as:  SYMBICORT Inhale 2 puffs into the lungs 2 (two) times daily.   donepezil 10 MG tablet Commonly known as:  ARICEPT Take 10 mg by mouth at bedtime.   ENSURE ENLIVE PO Take 1 Bottle by mouth 2 (two) times daily between meals.   feeding supplement (PRO-STAT SUGAR FREE 64) Liqd Take 30 mLs by mouth 2 (two) times daily between meals.   fluticasone 50 MCG/ACT nasal spray Commonly known as:  FLONASE Place 2 sprays into the nose daily.   furosemide 40 MG tablet Commonly known as:  LASIX Take 1 tablet (40 mg total) by mouth daily.   metoprolol tartrate 25 MG tablet Commonly known as:  LOPRESSOR Take 0.5 tablets (12.5 mg total) by mouth 2 (two) times daily.   potassium chloride 10 MEQ CR capsule Commonly known as:  MICRO-K Take 10 mEq by mouth daily.   simvastatin 20 MG tablet Commonly known as:  ZOCOR Take 20 mg by mouth daily.       Review of Systems  Constitutional: Negative for activity change, appetite change, chills, diaphoresis and fever.  HENT: Negative for congestion, mouth sores, nosebleeds, postnasal drip, sneezing, sore throat, trouble swallowing and voice change.   Respiratory: Negative for apnea, cough, choking, chest tightness, shortness of breath and wheezing.   Cardiovascular: Negative for chest pain, palpitations and leg swelling.  Gastrointestinal: Negative for abdominal distention, abdominal pain, constipation, diarrhea and nausea.  Genitourinary: Negative for difficulty urinating, dysuria, frequency and urgency.  Musculoskeletal: Negative for back pain, gait problem and myalgias. Arthralgias: typical arthritis.  Skin: Negative for color change, pallor, rash and wound.  Neurological: Positive for syncope (in hospital) and weakness. Negative for dizziness, tremors, speech difficulty, numbness and headaches.  Psychiatric/Behavioral: Positive  for confusion. Negative for agitation and behavioral problems.  All other systems reviewed and are negative.   Immunization History  Administered Date(s) Administered  . Pneumococcal Polysaccharide-23 02/07/1997, 01/02/2012, 06/23/2013  . Zoster 09/22/2007   Pertinent  Health Maintenance Due  Topic Date Due  . DEXA SCAN  11/01/1991  . PNA vac Low Risk Adult (2 of 2 - PCV13) 06/24/2014  . INFLUENZA VACCINE  09/24/2016   Fall Risk  03/23/2017 02/13/2017  Falls in the past year? No Yes  Number falls in past yr: - 1  Injury with Fall? - Yes  Risk Factor Category  - High Fall Risk   Functional Status Survey:    Vitals:   03/26/17 1417  BP: 120/60  Pulse: 67  Resp: 20  Temp: 97.6 F (36.4 C)  TempSrc: Oral  SpO2: 97%  Weight: 106 lb 12.8 oz (48.4 kg)  Height: 5' 1" (1.549 m)   Body mass index is 20.18 kg/m. Physical Exam  Constitutional: Vital signs   are normal. She appears well-developed. She is active and cooperative. She does not appear ill. No distress. Nasal cannula in place.  Fail appearing  HENT:  Head: Normocephalic and atraumatic.  Mouth/Throat: Uvula is midline, oropharynx is clear and moist and mucous membranes are normal. Mucous membranes are not pale, not dry and not cyanotic.  Eyes: Conjunctivae, EOM and lids are normal. Pupils are equal, round, and reactive to light.  Neck: Trachea normal, normal range of motion and full passive range of motion without pain. Neck supple. No JVD present. No tracheal deviation, no edema and no erythema present. No thyromegaly present.  Cardiovascular: Normal rate, normal heart sounds, intact distal pulses and normal pulses. An irregular rhythm present. Exam reveals no gallop, no distant heart sounds and no friction rub.  No murmur heard. Pulses:      Dorsalis pedis pulses are 2+ on the right side, and 2+ on the left side.  1+ BLE edema  Pulmonary/Chest: Effort normal and breath sounds normal. No accessory muscle usage. No  respiratory distress. She has no decreased breath sounds. She has no wheezes. She has no rhonchi. She has no rales. She exhibits no tenderness.  Abdominal: Soft. Normal appearance and bowel sounds are normal. She exhibits no distension and no ascites. There is no tenderness.  Musculoskeletal: Normal range of motion. She exhibits no edema or tenderness.  Expected osteoarthritis, stiffness; Bilateral Calves soft, supple. Negative Homan's Sign. B- pedal pulses equal; generalized weakness  Neurological: She is alert. She has normal strength. A sensory deficit is present. She exhibits abnormal muscle tone. Coordination and gait abnormal.  Skin: Skin is warm, dry and intact. She is not diaphoretic. No cyanosis. No pallor. Nails show no clubbing.  Psychiatric: She has a normal mood and affect. Her speech is normal and behavior is normal. Thought content normal. Cognition and memory are impaired. She expresses impulsivity. She exhibits abnormal recent memory.  Nursing note and vitals reviewed.   Labs reviewed: Recent Labs    03/03/17 0435 03/04/17 0333 03/05/17 0418 03/09/17 0435 03/24/17 0500  NA 140 137 135  --  140  K 3.3* 3.6 4.4  --  3.2*  CL 99* 96* 96*  --  95*  CO2 33* 34* 31  --  36*  GLUCOSE 98 100* 102*  --  84  BUN _0 --  23*  CREATININE 0.94 0.82 0.97 0.99 1.20*  CALCIUM 8.6* 8.3* 8.6*  --  9.0  MG 1.5* 1.9 1.9  --   --    Recent Labs    02/02/17 0739 03/24/17 0500  AST 49* 22  ALT 54 22  ALKPHOS 68 47  BILITOT 1.1 0.6  PROT 6.7 6.0*  ALBUMIN 2.9* 3.1*   Recent Labs    02/02/17 0739  02/07/17 0619 03/02/17 1619 03/24/17 0500  WBC 8.8   < > 8.3 6.3 8.6  NEUTROABS 7.0*  --   --   --  6.0  HGB 12.7   < > 15.1 13.0 12.3  HCT 37.3   < > 44.9 38.9 36.8  MCV 93.8   < > 94.6 95.6 95.2  PLT 299   < > 313 193 209   < > = values in this interval not displayed.   Lab Results  Component Value Date   TSH 1.470 02/04/2017   No results found for: HGBA1C No  results found for: CHOL, HDL, LDLCALC, LDLDIRECT, TRIG, CHOLHDL  Significant Diagnostic Results in last 30 days:  Dg Chest  2 View  Result Date: 03/06/2017 CLINICAL DATA:  Shortness of Breath EXAM: CHEST  2 VIEW COMPARISON:  Chest CT March 02, 2017 and chest radiograph February 02, 2017 FINDINGS: There is a degree of underlying fibrotic type change. There is no frank edema or consolidation. Heart is mildly enlarged with pulmonary vascularity within normal limits. There is aortic atherosclerosis. No adenopathy. There is marked collapse of a midthoracic vertebral body, stable. There is underlying osteoporosis. IMPRESSION: Underlying fibrotic type change without edema or consolidation. Stable cardiac silhouette. There is aortic atherosclerosis. Bones osteoporotic with marked wedging of a midthoracic vertebral body, stable. Aortic Atherosclerosis (ICD10-I70.0). Electronically Signed   By: William  Woodruff III M.D.   On: 03/06/2017 09:35   Ct Angio Chest Pe W And/or Wo Contrast  Result Date: 03/02/2017 CLINICAL DATA:  84-year-old female with low oxygen saturation. Shortness breath. History of congestive heart failure. Initial encounter. EXAM: CT ANGIOGRAPHY CHEST WITH CONTRAST TECHNIQUE: Multidetector CT imaging of the chest was performed using the standard protocol during bolus administration of intravenous contrast. Multiplanar CT image reconstructions and MIPs were obtained to evaluate the vascular anatomy. CONTRAST:  60mL ISOVUE-370 IOPAMIDOL (ISOVUE-370) INJECTION 76% COMPARISON:  02/03/2017 CT. FINDINGS: Cardiovascular:  No pulmonary embolus. Cardiomegaly.  Coronary artery calcification. Atherosclerotic changes thoracic aorta without aneurysm. Limited for evaluating for dissection. No obvious dissection noted. Calcified plaque origin great vessels. Mediastinum/Nodes: No mediastinal or hilar adenopathy. Collapsed esophagus appear slightly thickened. Lungs/Pleura: Emphysematous changes with superimposed mild  pulmonary vascular congestion. Slight enlargement of bilateral pleural effusions which are slightly loculated and greater on the right. Basilar atelectasis greater on the right. Infectious infiltrate within the right lung base felt to be less likely consideration. Stable right upper lobe 4 mm nodule (series 6, image 38). Scarring lung apices stable. Upper Abdomen: Left lobe liver 2.3 cm cyst suspected without change. Tiny right lobe liver hypodensities too small to characterize, without change and may represent cysts. Left renal 9 mm hyperdense lesion probably a hyperdense cyst. Hyperplasia adrenal glands. Atherosclerotic changes of of abdominal aorta and origin of the celiac/ superior mesenteric artery. Musculoskeletal: Compression fracture T8 with anterior wedge compression deformity with 90% loss of height centrally and slight retropulsion posterior inferior aspect contacting the ventral cord unchanged. Remote T3 and T4 superior endplate compression fracture. Review of the MIP images confirms the above findings. IMPRESSION: No pulmonary embolus noted. Emphysematous changes with superimposed mild pulmonary vascular congestion. Mild enlargement of bilateral pleural effusions which are slightly loculated and greater on the right. Basilar atelectasis greater on the right. Infectious infiltrate within the right lung base felt to be less likely consideration. Stable right upper lobe 4 mm nodule (series 6, image 38). No follow-up needed if patient is low-risk. Non-contrast chest CT can be considered in 12 months if patient is high-risk. This recommendation follows the consensus statement: Guidelines for Management of Incidental Pulmonary Nodules Detected on CT Images: From the Fleischner Society 2017; Radiology 2017; 284:228-243. Cardiomegaly.  Coronary artery calcifications. Similar appearance of T8 compression fracture mild retropulsion. No significant change T3 and T4 superior endplate compression fracture. Aortic  Atherosclerosis (ICD10-I70.0). Emphysema (ICD10-J43.9). Electronically Signed   By: Steven  Olson M.D.   On: 03/02/2017 17:53    Assessment/Plan Chronic diastolic congestive heart failure (HCC)  Stable  Continue Lasix 40 mg po Q Day  Continue Potassium Chloride 10 meq po Q Day  Vascular Dementia  Start Aricept 10 mg po Q HS for dementia  Monitor for safety  Protein-calorie malnutrition, unspecified severity (HCC)    Pro-stat 30 mL PO BID  Ensure Enlive 1 bottle po BID  Encourage increased po intake  Family/ staff Communication:   Total Time:  Documentation:  Face to Face:  Family/Phone:   Labs/tests ordered:  Reviewed recent labs, cbc, met c 1 week  Medication list reviewed and assessed for continued appropriateness. Monthly medication orders reviewed and signed.   H. , NP-C Geriatrics [facility] [address] [place], [place] [address] Cell Phone (Mon-Fri 8am-5pm):  [phone] On Call:  [phone] & follow prompts after 5pm & weekends Office Phone:  [phone] Office Fax:  [phone]    

## 2017-04-01 ENCOUNTER — Other Ambulatory Visit
Admission: RE | Admit: 2017-04-01 | Discharge: 2017-04-01 | Disposition: A | Discharge status: Home / Self Care | Payer: Medicare Other | Source: Other Acute Inpatient Hospital | Attending: Gerontology | Admitting: Gerontology

## 2017-04-01 DIAGNOSIS — E876 Hypokalemia: Secondary | ICD-10-CM | POA: Insufficient documentation

## 2017-04-01 DIAGNOSIS — R197 Diarrhea, unspecified: Secondary | ICD-10-CM | POA: Diagnosis present

## 2017-04-01 LAB — CBC WITH DIFFERENTIAL/PLATELET
BASOS ABS: 0.1 10*3/uL (ref 0–0.1)
BASOS PCT: 1 %
EOS ABS: 0.1 10*3/uL (ref 0–0.7)
Eosinophils Relative: 2 %
HCT: 41.1 % (ref 35.0–47.0)
Hemoglobin: 13.6 g/dL (ref 12.0–16.0)
Lymphocytes Relative: 16 %
Lymphs Abs: 1 10*3/uL (ref 1.0–3.6)
MCH: 31.6 pg (ref 26.0–34.0)
MCHC: 33.1 g/dL (ref 32.0–36.0)
MCV: 95.5 fL (ref 80.0–100.0)
MONO ABS: 0.8 10*3/uL (ref 0.2–0.9)
Monocytes Relative: 12 %
Neutro Abs: 4.6 10*3/uL (ref 1.4–6.5)
Neutrophils Relative %: 69 %
PLATELETS: 207 10*3/uL (ref 150–440)
RBC: 4.31 MIL/uL (ref 3.80–5.20)
RDW: 16.1 % — ABNORMAL HIGH (ref 11.5–14.5)
WBC: 6.6 10*3/uL (ref 3.6–11.0)

## 2017-04-01 LAB — BASIC METABOLIC PANEL
ANION GAP: 11 (ref 5–15)
BUN: 36 mg/dL — ABNORMAL HIGH (ref 6–20)
CO2: 32 mmol/L (ref 22–32)
Calcium: 9.2 mg/dL (ref 8.9–10.3)
Chloride: 95 mmol/L — ABNORMAL LOW (ref 101–111)
Creatinine, Ser: 0.9 mg/dL (ref 0.44–1.00)
GFR, EST NON AFRICAN AMERICAN: 55 mL/min — AB (ref >60)
GLUCOSE: 110 mg/dL — AB (ref 65–99)
Potassium: 4.1 mmol/L (ref 3.5–5.1)
Sodium: 138 mmol/L (ref 135–145)

## 2017-04-01 LAB — HEPATIC FUNCTION PANEL
ALT: 35 U/L (ref 14–54)
AST: 30 U/L (ref 15–41)
Albumin: 3.8 g/dL (ref 3.5–5.0)
Alkaline Phosphatase: 65 U/L (ref 38–126)
BILIRUBIN DIRECT: 0.1 mg/dL (ref 0.1–0.5)
Indirect Bilirubin: 0.6 mg/dL (ref 0.3–0.9)
Total Bilirubin: 0.7 mg/dL (ref 0.3–1.2)
Total Protein: 7.3 g/dL (ref 6.5–8.1)

## 2017-04-02 ENCOUNTER — Encounter: Payer: Self-pay | Admitting: Gerontology

## 2017-04-02 ENCOUNTER — Non-Acute Institutional Stay (SKILLED_NURSING_FACILITY): Payer: Medicare Other | Admitting: Gerontology

## 2017-04-02 DIAGNOSIS — R197 Diarrhea, unspecified: Secondary | ICD-10-CM

## 2017-04-02 DIAGNOSIS — I5032 Chronic diastolic (congestive) heart failure: Secondary | ICD-10-CM | POA: Diagnosis not present

## 2017-04-02 DIAGNOSIS — E46 Unspecified protein-calorie malnutrition: Secondary | ICD-10-CM | POA: Diagnosis not present

## 2017-04-02 DIAGNOSIS — F0150 Vascular dementia without behavioral disturbance: Secondary | ICD-10-CM | POA: Diagnosis not present

## 2017-04-02 NOTE — Progress Notes (Signed)
Location:   [facility] Room Number: 208B Place of Service:  SNF [phone]) Provider:  Toni Arthurs, NP-C  Derinda Late, MD  Patient Care Team: Derinda Late, MD as PCP - General (Family Medicine) Wellington Hampshire, MD as PCP - Cardiology (Cardiology) [place] Hampshire, MD as Consulting Physician (Cardiology) Alisa Graff, FNP as Nurse Practitioner (Family Medicine)  Extended Emergency Contact Information Primary Emergency Contact: Casimer Leek Address: [place], [place] [address] Home Phone: [phone] Relation: None  Code Status:  DNR Goals of care: Advanced Directive information Advanced Directives 04/02/2017  Does Patient Have a Medical Advance Directive? Yes  Type of Advance Directive Out of facility DNR (pink MOST or yellow form)  Does patient want to make changes to medical advance directive? No - Patient declined  Copy of [place] in Chart? -  Would patient like information on creating a medical advance directive? -     Chief Complaint  Patient presents with  . Medical Management of Chronic Issues    Routine Visit    HPI:  Pt is a 84 y.o. female seen today for medical management of chronic diseases.  Patient was admitted to the facility for rehab following hospitalization at [facility] for hypoxia due to CHF exacerbation.  She has been participating in PT/OT.  O2 sats on room air are now WNL.  Patient continues to have intermittent confusion.  She reports she does not have any pain and feels that her breathing has improved.  She reports she is eating well, voiding without difficulty.  However, she does report some loose stools.  She was unable to really describe the diarrhea or how frequent.  The stool is foul-smelling.  Will check for C. difficile.  Patient does also have hyperactive bowel sounds.  Patient denies nausea and vomiting.  Also denies chest pain or shortness of breath.  Patient is afebrile.   Otherwise, patient is pleasant and confused.  Vital signs stable.  No other complaints.     Past Medical History:  Diagnosis Date  . Allergic rhinitis   . Anxiety    unspecified  . Back pain    unspecified  . Cystitis   . Edema   . Gait abnormality   . Hand pain   . Hearing loss    left ear  . Hematuria   . History of atrial fibrillation   . Hyperlipidemia, unspecified   . Hypertension   . Osteoporosis   . Osteoporosis, post-menopausal   . PAF (paroxysmal atrial fibrillation) ([place])   . Polymyalgia ([place])   . Pulmonary hypertension ([place])   . Skin cancer    Past Surgical History:  Procedure Laterality Date  . BREAST BIOPSY    . INGUINAL HERNIA REPAIR Right 2005    No Known Allergies  Allergies as of 04/02/2017   No Known Allergies     Medication List        Accurate as of 04/02/17 10:10 AM. Always use your most recent med list.          acetaminophen 325 MG tablet Commonly known as:  TYLENOL Take 650 mg by mouth every 4 (four) hours as needed.   albuterol (2.5 MG/3ML) 0.083% nebulizer solution Commonly known as:  PROVENTIL Take 2.5 mg by nebulization every 6 (six) hours as needed for wheezing or shortness of breath.   amiodarone 200 MG tablet Commonly known as:  PACERONE Take  1 tablet (200 mg total) by mouth daily.   apixaban 2.5 MG Tabs tablet Commonly known as:  ELIQUIS Take 1 tablet (2.5 mg total) by mouth 2 (two) times daily.   azelastine 0.1 % nasal spray Commonly known as:  ASTELIN Place 2 sprays into both nostrils 2 (two) times daily. Use in each nostril as directed   budesonide-formoterol 160-4.5 MCG/ACT inhaler Commonly known as:  SYMBICORT Inhale 2 puffs into the lungs 2 (two) times daily.   donepezil 10 MG tablet Commonly known as:  ARICEPT Take 10 mg by mouth at bedtime.   ENSURE ENLIVE PO Take 1 Bottle by mouth 2 (two) times daily between meals.   feeding supplement (PRO-STAT SUGAR FREE 64) Liqd Take 30 mLs by mouth 2 (two) times  daily between meals.   fluticasone 50 MCG/ACT nasal spray Commonly known as:  FLONASE Place 2 sprays into the nose daily.   furosemide 40 MG tablet Commonly known as:  LASIX Take 1 tablet (40 mg total) by mouth daily.   metoprolol tartrate 25 MG tablet Commonly known as:  LOPRESSOR Take 0.5 tablets (12.5 mg total) by mouth 2 (two) times daily.   potassium chloride 10 MEQ CR capsule Commonly known as:  MICRO-K Take 10 mEq by mouth daily.   simvastatin 20 MG tablet Commonly known as:  ZOCOR Take 20 mg by mouth daily.       Review of Systems  Unable to perform ROS: Dementia  Constitutional: Negative for activity change, appetite change, chills, diaphoresis and fever.  HENT: Negative for congestion, mouth sores, nosebleeds, postnasal drip, sneezing, sore throat, trouble swallowing and voice change.   Respiratory: Negative for apnea, cough, choking, chest tightness, shortness of breath and wheezing.   Cardiovascular: Negative for chest pain, palpitations and leg swelling.  Gastrointestinal: Positive for diarrhea. Negative for abdominal distention, abdominal pain, constipation and nausea.  Genitourinary: Negative for difficulty urinating, dysuria, frequency and urgency.  Musculoskeletal: Positive for arthralgias (typical arthritis). Negative for back pain, gait problem and myalgias.  Skin: Negative for color change, pallor, rash and wound.  Neurological: Negative for dizziness, tremors, syncope, speech difficulty, weakness, numbness and headaches.  Psychiatric/Behavioral: Positive for confusion. Negative for agitation and behavioral problems.  All other systems reviewed and are negative.   Immunization History  Administered Date(s) Administered  . Pneumococcal Polysaccharide-23 02/07/1997, 01/02/2012, 06/23/2013  . Zoster 09/22/2007   Pertinent  Health Maintenance Due  Topic Date Due  . DEXA SCAN  11/01/1991  . PNA vac Low Risk Adult (2 of 2 - PCV13) 06/24/2014  . INFLUENZA  VACCINE  09/24/2016   Fall Risk  03/23/2017 02/13/2017  Falls in the past year? No Yes  Number falls in past yr: - 1  Injury with Fall? - Yes  Risk Factor Category  - High Fall Risk   Functional Status Survey:    Vitals:   04/02/17 0957  BP: (!) 124/50  Pulse: 70  Resp: (!) 22  Temp: 98.1 F (36.7 C)  TempSrc: Oral  SpO2: 96%  Weight: 109 lb 11.2 oz (49.8 kg)  Height: 5' 1"  (1.549 m)   Body mass index is 20.73 kg/m. Physical Exam  Constitutional: She is oriented to person, place, and time. Vital signs are normal. She appears well-developed and well-nourished. She is active and cooperative. She does not appear ill. No distress.  HENT:  Head: Normocephalic and atraumatic.  Mouth/Throat: Uvula is midline, oropharynx is clear and moist and mucous membranes are normal. Mucous membranes are not pale, not  dry and not cyanotic.  Eyes: Conjunctivae, EOM and lids are normal. Pupils are equal, round, and reactive to light.  Neck: Trachea normal, normal range of motion and full passive range of motion without pain. Neck supple. No JVD present. No tracheal deviation, no edema and no erythema present. No thyromegaly present.  Cardiovascular: Normal rate, regular rhythm, normal heart sounds, intact distal pulses and normal pulses. Exam reveals no gallop, no distant heart sounds and no friction rub.  No murmur heard. Pulses:      Dorsalis pedis pulses are 2+ on the right side, and 2+ on the left side.  No edema  Pulmonary/Chest: Effort normal and breath sounds normal. No accessory muscle usage. No respiratory distress. She has no decreased breath sounds. She has no wheezes. She has no rhonchi. She has no rales. She exhibits no tenderness.  Abdominal: Soft. Normal appearance. She exhibits no distension and no ascites. Bowel sounds are increased. There is no tenderness.  Musculoskeletal: Normal range of motion. She exhibits no edema or tenderness.  Expected osteoarthritis, stiffness; Bilateral  Calves soft, supple. Negative Homan's Sign. B- pedal pulses equal; generalized weakness  Neurological: She is alert and oriented to person, place, and time. She has normal strength. Gait abnormal.  Skin: Skin is warm, dry and intact. She is not diaphoretic. No cyanosis. No pallor. Nails show no clubbing.  Psychiatric: She has a normal mood and affect. Her speech is normal and behavior is normal. Judgment and thought content normal. Cognition and memory are impaired. She exhibits abnormal recent memory.  Nursing note and vitals reviewed.   Labs reviewed: Recent Labs    03/03/17 0435 03/04/17 0333 03/05/17 0418 03/09/17 0435 03/24/17 0500 04/01/17 1522  NA 140 137 135  --  140 138  K 3.3* 3.6 4.4  --  3.2* 4.1  CL 99* 96* 96*  --  95* 95*  CO2 33* 34* 31  --  36* 32  GLUCOSE 98 100* 102*  --  84 110*  BUN 13 14 18   --  23* 36*  CREATININE 0.94 0.82 0.97 0.99 1.20* 0.90  CALCIUM 8.6* 8.3* 8.6*  --  9.0 9.2  MG 1.5* 1.9 1.9  --   --   --    Recent Labs    02/02/17 0739 03/24/17 0500 04/01/17 1522  AST 49* 22 30  ALT 54 22 35  ALKPHOS 68 47 65  BILITOT 1.1 0.6 0.7  PROT 6.7 6.0* 7.3  ALBUMIN 2.9* 3.1* 3.8   Recent Labs    02/02/17 0739  03/02/17 1619 03/24/17 0500 04/01/17 1522  WBC 8.8   < > 6.3 8.6 6.6  NEUTROABS 7.0*  --   --  6.0 4.6  HGB 12.7   < > 13.0 12.3 13.6  HCT 37.3   < > 38.9 36.8 41.1  MCV 93.8   < > 95.6 95.2 95.5  PLT 299   < > 193 209 207   < > = values in this interval not displayed.   Lab Results  Component Value Date   TSH 1.470 02/04/2017   No results found for: HGBA1C No results found for: CHOL, HDL, LDLCALC, LDLDIRECT, TRIG, CHOLHDL  Significant Diagnostic Results in last 30 days:  Dg Chest 2 View  Result Date: 03/06/2017 CLINICAL DATA:  Shortness of Breath EXAM: CHEST  2 VIEW COMPARISON:  Chest CT March 02, 2017 and chest radiograph February 02, 2017 FINDINGS: There is a degree of underlying fibrotic type change. There is no frank  edema or consolidation. Heart is mildly enlarged with pulmonary vascularity within normal limits. There is aortic atherosclerosis. No adenopathy. There is marked collapse of a midthoracic vertebral body, stable. There is underlying osteoporosis. IMPRESSION: Underlying fibrotic type change without edema or consolidation. Stable cardiac silhouette. There is aortic atherosclerosis. Bones osteoporotic with marked wedging of a midthoracic vertebral body, stable. Aortic Atherosclerosis (ICD10-I70.0). Electronically Signed   By: Lowella Grip III M.D.   On: 03/06/2017 09:35    Assessment/Plan  Chronic diastolic congestive heart failure (HCC)  Stable  Continue Lasix 40 mg p.o. daily  Continue potassium chloride 10 mEq p.o. daily  Continue to elevate legs when at rest  Continue working with PT/OT  Continue exercises as taught by PT/OT  Monitor oxygen saturations every shift  O2 2 L nasal cannula as needed for sats less than 90%  Continue daily weights  Labs today  Follow-up with cardiologist as instructed  Vascular dementia without behavioral disturbance  Monitor for safety  Fall risk  APS referral for safety in the home situation  Reorient frequently as needed  Protein-calorie malnutrition, unspecified severity ([place])  Ongoing  Continue Ensure Enlive 1 bottle p.o. twice daily  Continue pro-stat 30 mL p.o. twice daily  Diarrhea, unspecified type  Send stool for C. difficile evaluation  If negative, standing order loperamide  Assist with bathroom needs as needed  Monitor skin integrity  Family/ staff Communication:   Total Time:  Documentation:  Face to Face:  Family/Phone:   Labs/tests ordered: CBC, met C  Medication list reviewed and assessed for continued appropriateness. Monthly medication orders reviewed and signed.  Vikki Ports, NP-C Geriatrics [facility] [phone], [place] [address] Cell  Phone (Mon-Fri 8am-5pm):  [phone] On Call:  [phone] & follow prompts after 5pm & weekends Office Phone:  [phone] Office Fax:  [phone]

## 2017-04-07 ENCOUNTER — Ambulatory Visit: Payer: Medicare Other | Admitting: Cardiovascular Disease

## 2017-04-08 ENCOUNTER — Encounter: Payer: Self-pay | Admitting: Cardiovascular Disease

## 2017-04-11 ENCOUNTER — Emergency Department: Payer: Medicare Other

## 2017-04-11 ENCOUNTER — Emergency Department
Admission: EM | Admit: 2017-04-11 | Discharge: 2017-04-11 | Disposition: A | Discharge status: Skilled Nursing Facility | Payer: Medicare Other | Attending: Emergency Medicine | Admitting: Emergency Medicine

## 2017-04-11 ENCOUNTER — Other Ambulatory Visit: Payer: Self-pay

## 2017-04-11 DIAGNOSIS — S22000A Wedge compression fracture of unspecified thoracic vertebra, initial encounter for closed fracture: Secondary | ICD-10-CM | POA: Diagnosis not present

## 2017-04-11 DIAGNOSIS — Z79899 Other long term (current) drug therapy: Secondary | ICD-10-CM | POA: Insufficient documentation

## 2017-04-11 DIAGNOSIS — Y9289 Other specified places as the place of occurrence of the external cause: Secondary | ICD-10-CM | POA: Insufficient documentation

## 2017-04-11 DIAGNOSIS — X58XXXA Exposure to other specified factors, initial encounter: Secondary | ICD-10-CM | POA: Insufficient documentation

## 2017-04-11 DIAGNOSIS — Z7901 Long term (current) use of anticoagulants: Secondary | ICD-10-CM | POA: Diagnosis not present

## 2017-04-11 DIAGNOSIS — Y999 Unspecified external cause status: Secondary | ICD-10-CM | POA: Insufficient documentation

## 2017-04-11 DIAGNOSIS — Z87891 Personal history of nicotine dependence: Secondary | ICD-10-CM | POA: Diagnosis not present

## 2017-04-11 DIAGNOSIS — Y9389 Activity, other specified: Secondary | ICD-10-CM | POA: Insufficient documentation

## 2017-04-11 DIAGNOSIS — I4891 Unspecified atrial fibrillation: Secondary | ICD-10-CM | POA: Insufficient documentation

## 2017-04-11 DIAGNOSIS — F0150 Vascular dementia without behavioral disturbance: Secondary | ICD-10-CM | POA: Diagnosis not present

## 2017-04-11 DIAGNOSIS — M545 Low back pain: Secondary | ICD-10-CM | POA: Diagnosis present

## 2017-04-11 DIAGNOSIS — I110 Hypertensive heart disease with heart failure: Secondary | ICD-10-CM | POA: Diagnosis not present

## 2017-04-11 DIAGNOSIS — I5032 Chronic diastolic (congestive) heart failure: Secondary | ICD-10-CM | POA: Insufficient documentation

## 2017-04-11 LAB — URINALYSIS, COMPLETE (UACMP) WITH MICROSCOPIC
Bacteria, UA: NONE SEEN
Bilirubin Urine: NEGATIVE
GLUCOSE, UA: NEGATIVE mg/dL
HGB URINE DIPSTICK: NEGATIVE
Ketones, ur: NEGATIVE mg/dL
Leukocytes, UA: NEGATIVE
Nitrite: NEGATIVE
PH: 5 (ref 5.0–8.0)
Protein, ur: NEGATIVE mg/dL
SPECIFIC GRAVITY, URINE: 1.016 (ref 1.005–1.030)
Squamous Epithelial / LPF: NONE SEEN

## 2017-04-11 LAB — COMPREHENSIVE METABOLIC PANEL
ALK PHOS: 62 U/L (ref 38–126)
ALT: 35 U/L (ref 14–54)
AST: 36 U/L (ref 15–41)
Albumin: 3.3 g/dL — ABNORMAL LOW (ref 3.5–5.0)
Anion gap: 9 (ref 5–15)
BUN: 37 mg/dL — AB (ref 6–20)
CALCIUM: 8.8 mg/dL — AB (ref 8.9–10.3)
CO2: 30 mmol/L (ref 22–32)
CREATININE: 1.05 mg/dL — AB (ref 0.44–1.00)
Chloride: 99 mmol/L — ABNORMAL LOW (ref 101–111)
GFR calc non Af Amer: 45 mL/min — ABNORMAL LOW (ref >60)
GFR, EST AFRICAN AMERICAN: 53 mL/min — AB (ref >60)
Glucose, Bld: 122 mg/dL — ABNORMAL HIGH (ref 65–99)
Potassium: 4 mmol/L (ref 3.5–5.1)
Sodium: 138 mmol/L (ref 135–145)
Total Bilirubin: 0.8 mg/dL (ref 0.3–1.2)
Total Protein: 6.7 g/dL (ref 6.5–8.1)

## 2017-04-11 LAB — CBC WITH DIFFERENTIAL/PLATELET
Basophils Absolute: 0 10*3/uL (ref 0–0.1)
Basophils Relative: 0 %
EOS ABS: 0.1 10*3/uL (ref 0–0.7)
EOS PCT: 1 %
HCT: 38.1 % (ref 35.0–47.0)
HEMOGLOBIN: 12.7 g/dL (ref 12.0–16.0)
Lymphocytes Relative: 9 %
Lymphs Abs: 0.8 10*3/uL — ABNORMAL LOW (ref 1.0–3.6)
MCH: 31.5 pg (ref 26.0–34.0)
MCHC: 33.3 g/dL (ref 32.0–36.0)
MCV: 94.7 fL (ref 80.0–100.0)
MONO ABS: 1.1 10*3/uL — AB (ref 0.2–0.9)
MONOS PCT: 12 %
NEUTROS PCT: 78 %
Neutro Abs: 6.9 10*3/uL — ABNORMAL HIGH (ref 1.4–6.5)
PLATELETS: 208 10*3/uL (ref 150–440)
RBC: 4.03 MIL/uL (ref 3.80–5.20)
RDW: 16.9 % — AB (ref 11.5–14.5)
WBC: 8.9 10*3/uL (ref 3.6–11.0)

## 2017-04-11 LAB — LIPASE, BLOOD: Lipase: 50 U/L (ref 11–51)

## 2017-04-11 MED ORDER — KETOROLAC TROMETHAMINE 30 MG/ML IJ SOLN
10.0000 mg | Freq: Once | INTRAMUSCULAR | Status: AC
  Administered 2017-04-11: 9.9 mg via INTRAVENOUS
  Filled 2017-04-11: qty 1

## 2017-04-11 MED ORDER — MORPHINE SULFATE (PF) 2 MG/ML IV SOLN
2.0000 mg | Freq: Once | INTRAVENOUS | Status: AC
Start: 2017-04-11 — End: 2017-04-11
  Administered 2017-04-11: 2 mg via INTRAVENOUS
  Filled 2017-04-11: qty 1

## 2017-04-11 MED ORDER — ONDANSETRON HCL 4 MG/2ML IJ SOLN
4.0000 mg | Freq: Once | INTRAMUSCULAR | Status: AC
  Administered 2017-04-11: 4 mg via INTRAVENOUS
  Filled 2017-04-11: qty 2

## 2017-04-11 MED ORDER — MORPHINE SULFATE (PF) 2 MG/ML IV SOLN
2.0000 mg | Freq: Once | INTRAVENOUS | Status: AC
  Administered 2017-04-11: 2 mg via INTRAVENOUS
  Filled 2017-04-11: qty 1

## 2017-04-11 MED ORDER — HYDROCODONE-ACETAMINOPHEN 5-325 MG PO TABS: 1.0000 | ORAL_TABLET | Freq: Four times a day (QID) | ORAL | 0 refills | Status: DC | PRN

## 2017-04-11 NOTE — ED Notes (Signed)
Patient taken to toilet to urinate, hat placed in toilet, patient missed hat.

## 2017-04-11 NOTE — ED Notes (Signed)
During discharge patient was found to be 77% on RA.  Patient encouraged to take deep breaths but oxygen saturation did not change.  Patient alert and talking.  Patient was placed on 4L [place] and oxygen increased to 92%.  Charger RN notified at this time due to MD and primary nurse not being available.

## 2017-04-11 NOTE — ED Notes (Addendum)
ED provider stated that a private company will come and fit patient for the TLSO brace and apply it

## 2017-04-11 NOTE — ED Provider Notes (Signed)
[facility] Emergency Department Provider Note  ____________________________________________   First MD Initiated Contact with Patient 04/11/17 1528     (approximate)  I have reviewed the triage vital signs and the nursing notes.   HISTORY  Chief Complaint Back Pain  Level 5 exemption history limited by the patient's dementia  HPI Krystal White is a 84 y.o. female who comes to the emergency department by EMS with lower back pain for the past 24 hours.  The patient recently started living in an assisted living facility and noted that yesterday she had gradual onset diffuse lower back throbbing discomfort.  The pain is difficult for her to quantify.  It is severe.  Nothing in particular seems to make it better or worse.  It is constant.  It does seem to radiate somewhat down her right buttock.  She denies fevers or chills.  She denies trauma.  She denies falls.  The patient was ambulatory when EMS arrived.  Past Medical History:  Diagnosis Date  . Allergic rhinitis   . Anxiety    unspecified  . Back pain    unspecified  . Cystitis   . Edema   . Gait abnormality   . Hand pain   . Hearing loss    left ear  . Hematuria   . History of atrial fibrillation   . Hyperlipidemia, unspecified   . Hypertension   . Osteoporosis   . Osteoporosis, post-menopausal   . PAF (paroxysmal atrial fibrillation) ([place])   . Polymyalgia ([place])   . Pulmonary hypertension ([place])   . Skin cancer     Patient Active Problem List   Diagnosis Date Noted  . Vascular dementia 03/26/2017  . CHF (congestive heart failure) ([place]) 03/02/2017  . Chronic diastolic heart failure ([place]) 02/13/2017  . HTN (hypertension) 02/13/2017  . Atrial fibrillation with RVR ([place]) 02/02/2017    Past Surgical History:  Procedure Laterality Date  . BREAST BIOPSY    . INGUINAL HERNIA REPAIR Right 2005    Prior to Admission medications   Medication Sig Start Date End Date Taking? Authorizing  Provider  acetaminophen (TYLENOL) 325 MG tablet Take 650 mg by mouth every 4 (four) hours as needed.    [provider]  albuterol (PROVENTIL) (2.5 MG/3ML) 0.083% nebulizer solution Take 2.5 mg by nebulization every 6 (six) hours as needed for wheezing or shortness of breath.    [provider]  Amino Acids-Protein Hydrolys (FEEDING SUPPLEMENT, PRO-STAT SUGAR FREE 64,) LIQD Take 30 mLs by mouth 2 (two) times daily between meals.     [provider]  amiodarone (PACERONE) 200 MG tablet Take 1 tablet (200 mg total) by mouth daily. 03/07/17   Salary, Avel Peace, MD  apixaban (ELIQUIS) 2.5 MG TABS tablet Take 1 tablet (2.5 mg total) by mouth 2 (two) times daily. 02/07/17   Henreitta Leber, MD  azelastine (ASTELIN) 0.1 % nasal spray Place 2 sprays into both nostrils 2 (two) times daily. Use in each nostril as directed    [provider]  budesonide-formoterol (SYMBICORT) 160-4.5 MCG/ACT inhaler Inhale 2 puffs into the lungs 2 (two) times daily. 03/06/17   Salary, Avel Peace, MD  donepezil (ARICEPT) 10 MG tablet Take 10 mg by mouth at bedtime.    [provider]  fluticasone (FLONASE) 50 MCG/ACT nasal spray Place 2 sprays into the nose daily.  12/23/16 12/23/17  [provider]  furosemide (LASIX) 40 MG tablet Take 1 tablet (40 mg total) by mouth  daily. 03/07/17   Salary, Avel Peace, MD  HYDROcodone-acetaminophen (NORCO) 5-325 MG tablet Take 1 tablet by mouth every 6 (six) hours as needed for up to 15 doses for severe pain. 04/11/17   Darel Hong, MD  metoprolol tartrate (LOPRESSOR) 25 MG tablet Take 0.5 tablets (12.5 mg total) by mouth 2 (two) times daily. 03/06/17   Salary, Avel Peace, MD  Nutritional Supplements (ENSURE ENLIVE PO) Take 1 Bottle by mouth 2 (two) times daily between meals.     [provider]  potassium chloride (MICRO-K) 10 MEQ CR capsule Take 10 mEq by mouth daily.    [provider]  simvastatin (ZOCOR) 20 MG tablet  Take 20 mg by mouth daily.    [provider]    Allergies Patient has no known allergies.  Family History  Problem Relation Age of Onset  . Heart failure Sister   . Cancer Sister   . Aneurysm Mother   . Colon cancer Father   . Heart attack Brother   . Cancer Sister     Social History Social History   Tobacco Use  . Smoking status: Former Smoker    Packs/day: 1.00    Years: 25.00    Pack years: 25.00    Types: Cigarettes    Last attempt to quit: 02/24/1978    Years since quitting: 39.1  . Smokeless tobacco: Never Used  Substance Use Topics  . Alcohol use: No  . Drug use: No    Review of Systems Level 5 exemption history limited by the patient's dementia  ____________________________________________   PHYSICAL EXAM:  VITAL SIGNS: ED Triage Vitals  Enc Vitals Group     BP 04/11/17 1525 122/60     Pulse Rate 04/11/17 1525 64     Resp 04/11/17 1525 20     Temp --      Temp src --      SpO2 04/11/17 1525 94 %     Weight 04/11/17 1524 109 lb (49.4 kg)     Height 04/11/17 1524 5\' 1"  (1.549 m)     Head Circumference --      Peak Flow --      Pain Score 04/11/17 1524 4     Pain Loc --      Pain Edu? --      Excl. in [place]? --     Constitutional: Alert and oriented x4 appears quite uncomfortable tearful in bed Eyes: PERRL EOMI. Head: Atraumatic. Nose: No congestion/rhinnorhea. Mouth/Throat: No trismus Neck: No stridor.   Cardiovascular: Normal rate, regular rhythm. Grossly normal heart sounds.  Good peripheral circulation. Respiratory: Normal respiratory effort.  No retractions. Lungs CTAB and moving good air Gastrointestinal: Soft nondistended nontender no rebound no guarding no peritonitis no masses appreciated Musculoskeletal: Quite tender thoracic spine midline Neurologic:  Normal speech and language. No gross focal neurologic deficits are appreciated. 5 out of 5 all 4 sensation intact light touch L4 Skin:  Skin is warm, dry and intact. No rash  noted. Psychiatric: Mood and affect are normal. Speech and behavior are normal.    ____________________________________________   DIFFERENTIAL includes but not limited to  Kidney stone, pyelonephritis, AAA, aortic dissection, musculoskeletal pain ____________________________________________   LABS (all labs ordered are listed, but only abnormal results are displayed)  Labs Reviewed  COMPREHENSIVE METABOLIC PANEL - Abnormal; Notable for the following components:      Result Value   Chloride 99 (*)    Glucose, Bld 122 (*)    BUN 37 (*)  Creatinine, Ser 1.05 (*)    Calcium 8.8 (*)    Albumin 3.3 (*)    GFR calc non Af Amer 45 (*)    GFR calc Af Amer 53 (*)    All other components within normal limits  CBC WITH DIFFERENTIAL/PLATELET - Abnormal; Notable for the following components:   RDW 16.9 (*)    Neutro Abs 6.9 (*)    Lymphs Abs 0.8 (*)    Monocytes Absolute 1.1 (*)    All other components within normal limits  URINALYSIS, COMPLETE (UACMP) WITH MICROSCOPIC - Abnormal; Notable for the following components:   Color, Urine YELLOW (*)    APPearance CLEAR (*)    All other components within normal limits  LIPASE, BLOOD    Lab work reviewed by me as no acute disease __________________________________________  EKG    ____________________________________________  RADIOLOGY  Chest x-ray reviewed by me concerning for acute thoracic spine fracture CT scan of the thoracic and lumbar spine reviewed by me shows acute thoracic spinal fracture ____________________________________________   PROCEDURES  Procedure(s) performed: no  Procedures  Critical Care performed: no  Observation: no ____________________________________________   INITIAL IMPRESSION / ASSESSMENT AND PLAN / ED COURSE  Pertinent labs & imaging results that were available during my care of the patient were reviewed by me and considered in my medical decision making (see chart for details).  The  patient is a challenging historian and it is difficult for her to quantify her symptoms.  She arrives uncomfortable appearing with a diffusely tender back.  Noted to be hypoxic to about 87% on room air.  Differentials extremely broad but given her advanced age and comorbidities I am concerned about intra-abdominal pathology.  IV, blood work, and In-N-Out urinalysis, and CT scan with IV contrast are pending as well as chest x-ray.    Fortunately the patient remains neuro intact.  She has a single level thoracic spine fracture.  No evidence of metastases.  Placed in a TLSO brace with improvement in her symptoms.  Will be discharged back to her nursing home.  Her caretaker and the patient verbalized understanding and agreed with plan.  ____________________________________________   FINAL CLINICAL IMPRESSION(S) / ED DIAGNOSES  Final diagnoses:  Closed compression fracture of thoracic vertebra, initial encounter ([place])      NEW MEDICATIONS STARTED DURING THIS VISIT:  Discharge Medication List as of 04/11/2017  7:16 PM    START taking these medications   Details  HYDROcodone-acetaminophen (NORCO) 5-325 MG tablet Take 1 tablet by mouth every 6 (six) hours as needed for up to 15 doses for severe pain., Starting Sat 04/11/2017, Print         Note:  This document was prepared using Dragon voice recognition software and may include unintentional dictation errors.     Darel Hong, MD 04/13/17 [phone]

## 2017-04-11 NOTE — ED Notes (Addendum)
MD in to evaluate patient for report of lower oxygen sat. Patient repositioned in bed and instructed to take deep breaths. Oxygen saturation returned to 94% almost immediately. MD okayed patient for discharge.

## 2017-04-11 NOTE — ED Notes (Signed)
Patient transported to CT 

## 2017-04-11 NOTE — ED Notes (Signed)
Reports back pain over entire back 10/10

## 2017-04-11 NOTE — ED Notes (Addendum)
Oxygen removed from patient to evaluate a potential drop in oxygen saturation. Patient drops from 100 to 90 and when repositioned and asked to take deep breath O2 sat returns to 94%. MD made aware.

## 2017-04-11 NOTE — ED Notes (Signed)
Patient returned from CT

## 2017-04-11 NOTE — ED Notes (Signed)
This RN called Krystal White, director where pt resides, at [phone] who stated she would find a ride for pt and would be to ED in about 30 minutes.

## 2017-04-11 NOTE — ED Triage Notes (Signed)
Pt arrives via EMS from [facility] c/o back pain for 2 days. Pt ambulatory with walker upon EMS arrival. VS stable.

## 2017-04-11 NOTE — Discharge Instructions (Signed)
Please take your pain medication as needed for severe symptoms and wear your brace at all times for comfort.  Make an appointment to follow-up with the back specialist within 1 week for reevaluation and return to the emergency department sooner for any concerns.  It was a pleasure to take care of you today, and thank you for coming to our emergency department.  If you have any questions or concerns before leaving please ask the nurse to grab me and I'm more than happy to go through your aftercare instructions again.  If you were prescribed any opioid pain medication today such as Norco, Vicodin, Percocet, morphine, hydrocodone, or oxycodone please make sure you do not drive when you are taking this medication as it can alter your ability to drive safely.  If you have any concerns once you are home that you are not improving or are in fact getting worse before you can make it to your follow-up appointment, please do not hesitate to call 911 and come back for further evaluation.  Darel Hong, MD  Results for orders placed or performed during the hospital encounter of 04/11/17  Comprehensive metabolic panel  Result Value Ref Range   Sodium 138 135 - 145 mmol/L   Potassium 4.0 3.5 - 5.1 mmol/L   Chloride 99 (L) 101 - 111 mmol/L   CO2 30 22 - 32 mmol/L   Glucose, Bld 122 (H) 65 - 99 mg/dL   BUN 37 (H) 6 - 20 mg/dL   Creatinine, Ser 1.05 (H) 0.44 - 1.00 mg/dL   Calcium 8.8 (L) 8.9 - 10.3 mg/dL   Total Protein 6.7 6.5 - 8.1 g/dL   Albumin 3.3 (L) 3.5 - 5.0 g/dL   AST 36 15 - 41 U/L   ALT 35 14 - 54 U/L   Alkaline Phosphatase 62 38 - 126 U/L   Total Bilirubin 0.8 0.3 - 1.2 mg/dL   GFR calc non Af Amer 45 (L) >60 mL/min   GFR calc Af Amer 53 (L) >60 mL/min   Anion gap 9 5 - 15  CBC with Differential  Result Value Ref Range   WBC 8.9 3.6 - 11.0 K/uL   RBC 4.03 3.80 - 5.20 MIL/uL   Hemoglobin 12.7 12.0 - 16.0 g/dL   HCT 38.1 35.0 - 47.0 %   MCV 94.7 80.0 - 100.0 fL   MCH 31.5 26.0 - 34.0  pg   MCHC 33.3 32.0 - 36.0 g/dL   RDW 16.9 (H) 11.5 - 14.5 %   Platelets 208 150 - 440 K/uL   Neutrophils Relative % 78 %   Neutro Abs 6.9 (H) 1.4 - 6.5 K/uL   Lymphocytes Relative 9 %   Lymphs Abs 0.8 (L) 1.0 - 3.6 K/uL   Monocytes Relative 12 %   Monocytes Absolute 1.1 (H) 0.2 - 0.9 K/uL   Eosinophils Relative 1 %   Eosinophils Absolute 0.1 0 - 0.7 K/uL   Basophils Relative 0 %   Basophils Absolute 0.0 0 - 0.1 K/uL  Urinalysis, Complete w Microscopic  Result Value Ref Range   Color, Urine YELLOW (A) YELLOW   APPearance CLEAR (A) CLEAR   Specific Gravity, Urine 1.016 1.005 - 1.030   pH 5.0 5.0 - 8.0   Glucose, UA NEGATIVE NEGATIVE mg/dL   Hgb urine dipstick NEGATIVE NEGATIVE   Bilirubin Urine NEGATIVE NEGATIVE   Ketones, ur NEGATIVE NEGATIVE mg/dL   Protein, ur NEGATIVE NEGATIVE mg/dL   Nitrite NEGATIVE NEGATIVE   Leukocytes, UA NEGATIVE  NEGATIVE   RBC / HPF 0-5 0 - 5 RBC/hpf   WBC, UA 0-5 0 - 5 WBC/hpf   Bacteria, UA NONE SEEN NONE SEEN   Squamous Epithelial / LPF NONE SEEN NONE SEEN   Mucus PRESENT   Lipase, blood  Result Value Ref Range   Lipase 50 11 - 51 U/L   Dg Chest 2 View  Result Date: 04/11/2017 CLINICAL DATA:  Back pain. EXAM: CHEST  2 VIEW COMPARISON:  March 06, 2017 FINDINGS: Stable cardiomegaly. The hila and mediastinum are normal. No pulmonary nodules, masses, or focal infiltrates. There is a compression fracture of a midthoracic vertebral body which is stable. For levels lower there is a no other compression fracture which is new since March 06, 2017. No other acute abnormalities IMPRESSION: New compression fracture of a lower thoracic vertebral body. Stable compression fracture of a midthoracic vertebral body. No other acute abnormalities. Electronically Signed   By: Dorise Bullion III M.D   On: 04/11/2017 15:59   Ct Thoracic Spine Wo Contrast  Result Date: 04/11/2017 CLINICAL DATA:  84 y/o  F; 2 days of back pain. EXAM: CT THORACIC AND LUMBAR SPINE  WITHOUT CONTRAST TECHNIQUE: Multidetector CT imaging of the thoracic and lumbar spine was performed without contrast. Multiplanar CT image reconstructions were also generated. COMPARISON:  03/02/2017 CT chest. 03/30/2012 lumbar spine radiographs. 04/11/2017 chest radiograph. FINDINGS: CT THORACIC SPINE FINDINGS Alignment: Normal thoracic kyphosis. Vertebrae: Stable T3 and T4 superior endplate chronic fractures with mild loss of vertebral body height. Stable T8 vertebral body compression fracture with severe loss of height and minimal retropulsion of inferior endplate. New T12 vertebral body superior endplate fracture with moderate 50% loss of vertebral body height and 4 mm bony retropulsion of superior endplate mild associated canal stenosis. Bones are demineralized. No lytic or blastic lesion identified. Paraspinal and other soft tissues: Extensive respiratory motion artifact of visualized lung fields. Moderate centrilobular emphysema with upper lobe predominance. Biapical pleuroparenchymal scarring with calcifications. Scattered mucous impaction of airways. Severe calcific atherosclerosis of normal caliber thoracic aorta. Enlarged main pulmonary artery measuring 3.4 cm. Moderate to severe coronary artery calcification. Disc levels: Mild multilevel discogenic degenerative changes with anterior marginal osteophytes and disc space narrowing. No high-grade bony foraminal or canal stenosis. CT LUMBAR SPINE FINDINGS Segmentation: 5 lumbar type vertebrae. Alignment: Stable grade 1 L5-S1 anterolisthesis with associated chronic L5 pars defects bilaterally. Otherwise normal lumbar lordosis. Mild S-shaped curvature with rightward apex at L1 and leftward apex at L4. Vertebrae: No acute fracture or focal pathologic process. Paraspinal and other soft tissues: Left kidney upper pole subcentimeter hemorrhagic cyst. Disc levels: Mild loss of intervertebral disc space height at the L4-5 level and multilevel facet arthrosis. Small  disc bulges throughout the lumbar spine. No high-grade foraminal or canal stenosis. IMPRESSION: CT THORACIC SPINE IMPRESSION 1. T12 superior endplate fracture with moderate 50% loss of vertebral body height, new from 03/02/2017. Mild retropulsion of superior endplate with mild associated bony canal stenosis. 2. Stable mild T3, mild T4, and severe T8 chronic compression deformities. 3. No expansile lytic or blastic lesion identified. Bones are demineralized. CT LUMBAR SPINE IMPRESSION 1. No acute fracture or dislocation. 2. Stable mild S-shaped curvature, L5-S1 grade 1 anterolisthesis, and chronic L5 pars defects. 3. Stable mild lumbar spondylosis. No high-grade foraminal or canal stenosis identified Electronically Signed   By: Kristine Garbe M.D.   On: 04/11/2017 17:51   Ct Lumbar Spine Wo Contrast  Result Date: 04/11/2017 CLINICAL DATA:  84 y/o  F;  2 days of back pain. EXAM: CT THORACIC AND LUMBAR SPINE WITHOUT CONTRAST TECHNIQUE: Multidetector CT imaging of the thoracic and lumbar spine was performed without contrast. Multiplanar CT image reconstructions were also generated. COMPARISON:  03/02/2017 CT chest. 03/30/2012 lumbar spine radiographs. 04/11/2017 chest radiograph. FINDINGS: CT THORACIC SPINE FINDINGS Alignment: Normal thoracic kyphosis. Vertebrae: Stable T3 and T4 superior endplate chronic fractures with mild loss of vertebral body height. Stable T8 vertebral body compression fracture with severe loss of height and minimal retropulsion of inferior endplate. New T12 vertebral body superior endplate fracture with moderate 50% loss of vertebral body height and 4 mm bony retropulsion of superior endplate mild associated canal stenosis. Bones are demineralized. No lytic or blastic lesion identified. Paraspinal and other soft tissues: Extensive respiratory motion artifact of visualized lung fields. Moderate centrilobular emphysema with upper lobe predominance. Biapical pleuroparenchymal scarring  with calcifications. Scattered mucous impaction of airways. Severe calcific atherosclerosis of normal caliber thoracic aorta. Enlarged main pulmonary artery measuring 3.4 cm. Moderate to severe coronary artery calcification. Disc levels: Mild multilevel discogenic degenerative changes with anterior marginal osteophytes and disc space narrowing. No high-grade bony foraminal or canal stenosis. CT LUMBAR SPINE FINDINGS Segmentation: 5 lumbar type vertebrae. Alignment: Stable grade 1 L5-S1 anterolisthesis with associated chronic L5 pars defects bilaterally. Otherwise normal lumbar lordosis. Mild S-shaped curvature with rightward apex at L1 and leftward apex at L4. Vertebrae: No acute fracture or focal pathologic process. Paraspinal and other soft tissues: Left kidney upper pole subcentimeter hemorrhagic cyst. Disc levels: Mild loss of intervertebral disc space height at the L4-5 level and multilevel facet arthrosis. Small disc bulges throughout the lumbar spine. No high-grade foraminal or canal stenosis. IMPRESSION: CT THORACIC SPINE IMPRESSION 1. T12 superior endplate fracture with moderate 50% loss of vertebral body height, new from 03/02/2017. Mild retropulsion of superior endplate with mild associated bony canal stenosis. 2. Stable mild T3, mild T4, and severe T8 chronic compression deformities. 3. No expansile lytic or blastic lesion identified. Bones are demineralized. CT LUMBAR SPINE IMPRESSION 1. No acute fracture or dislocation. 2. Stable mild S-shaped curvature, L5-S1 grade 1 anterolisthesis, and chronic L5 pars defects. 3. Stable mild lumbar spondylosis. No high-grade foraminal or canal stenosis identified Electronically Signed   By: Kristine Garbe M.D.   On: 04/11/2017 17:51

## 2017-04-13 ENCOUNTER — Emergency Department
Admission: EM | Admit: 2017-04-13 | Discharge: 2017-04-13 | Disposition: A | Discharge status: Nursing Home (Includes ICF) | Payer: Medicare Other | Attending: Emergency Medicine | Admitting: Emergency Medicine

## 2017-04-13 ENCOUNTER — Encounter: Payer: Self-pay | Admitting: Emergency Medicine

## 2017-04-13 ENCOUNTER — Other Ambulatory Visit: Payer: Self-pay

## 2017-04-13 ENCOUNTER — Emergency Department: Payer: Medicare Other

## 2017-04-13 DIAGNOSIS — I5032 Chronic diastolic (congestive) heart failure: Secondary | ICD-10-CM | POA: Insufficient documentation

## 2017-04-13 DIAGNOSIS — M25559 Pain in unspecified hip: Secondary | ICD-10-CM | POA: Diagnosis not present

## 2017-04-13 DIAGNOSIS — I110 Hypertensive heart disease with heart failure: Secondary | ICD-10-CM | POA: Insufficient documentation

## 2017-04-13 DIAGNOSIS — Z87891 Personal history of nicotine dependence: Secondary | ICD-10-CM | POA: Insufficient documentation

## 2017-04-13 DIAGNOSIS — R1010 Upper abdominal pain, unspecified: Secondary | ICD-10-CM | POA: Diagnosis not present

## 2017-04-13 DIAGNOSIS — M4854XG Collapsed vertebra, not elsewhere classified, thoracic region, subsequent encounter for fracture with delayed healing: Secondary | ICD-10-CM | POA: Insufficient documentation

## 2017-04-13 DIAGNOSIS — S22000G Wedge compression fracture of unspecified thoracic vertebra, subsequent encounter for fracture with delayed healing: Secondary | ICD-10-CM

## 2017-04-13 DIAGNOSIS — M545 Low back pain: Secondary | ICD-10-CM | POA: Diagnosis present

## 2017-04-13 DIAGNOSIS — Z79899 Other long term (current) drug therapy: Secondary | ICD-10-CM | POA: Insufficient documentation

## 2017-04-13 MED ORDER — IOPAMIDOL (ISOVUE-300) INJECTION 61%
75.0000 mL | Freq: Once | INTRAVENOUS | Status: AC | PRN
  Administered 2017-04-13: 75 mL via INTRAVENOUS

## 2017-04-13 MED ORDER — OXYCODONE-ACETAMINOPHEN 5-325 MG PO TABS: 1.0000 | ORAL_TABLET | Freq: Four times a day (QID) | ORAL | 0 refills | Status: DC | PRN

## 2017-04-13 MED ORDER — HYDROMORPHONE HCL 1 MG/ML IJ SOLN
0.5000 mg | Freq: Once | INTRAMUSCULAR | Status: AC
  Administered 2017-04-13: 0.5 mg via INTRAVENOUS
  Filled 2017-04-13: qty 1

## 2017-04-13 MED ORDER — ONDANSETRON HCL 4 MG/2ML IJ SOLN
4.0000 mg | Freq: Once | INTRAMUSCULAR | Status: AC
  Administered 2017-04-13: 4 mg via INTRAVENOUS
  Filled 2017-04-13: qty 2

## 2017-04-13 NOTE — ED Notes (Signed)
Pt assisted to toilet to void 

## 2017-04-13 NOTE — Discharge Instructions (Signed)
Stop the hydrocodone. Use the roxicet 1 or 2 pills 4 x a day as needed for pain. I have spoken with Dr. Rudene Christians, the orthopedic surgeon. He can do kyphoplasty which involves taking a small cut in the back and putting glue into the broken vertebrae. This should make it feel much better. He will need to do to stop the Eliquis after tonight's dose and then he will see her in the office on Wednesday.please call her doctor as we discussed and make sure it's okay to stop the Elilquis

## 2017-04-13 NOTE — ED Triage Notes (Signed)
Pt arrived via ems for c/o lower back and hip pain - she fx back 2 wks ago and refuses to wear back brace because it makes the pain much worse per pt

## 2017-04-13 NOTE — ED Provider Notes (Signed)
[facility] Emergency Department Provider Note  \ ____________________________________________   First MD Initiated Contact with Patient 04/13/17 1122     (approximate)  I have reviewed the triage vital signs and the nursing notes.   HISTORY  Chief Complaint Back Pain    HPI Krystal White is a 84 y.o. female Patient comes from assisted living, complaining of severe back pain is worse when she sits. She was seen here 2 days ago and diagnosed with a new T12 compression fracture. There is some retropulsion and canal stenosis. She is refusing to wear her back brace because she says it makes it worse.right now patient says when she sits the pain is worse around the top of her hips and then in the left side as well. Pain around the top of her hips could be she's having some irritation of the 12th nerve root as sets were the dermatome for that segment is. Some pain in the left side may be due to something else. We'll get a CT scan of her abdomen and pelvis and just make sure there is no other fractures going on since she has such bad osteoporosis documented. She also has pain on compression of both hips.   Past Medical History:  Diagnosis Date  . Allergic rhinitis   . Anxiety    unspecified  . Back pain    unspecified  . Cystitis   . Edema   . Gait abnormality   . Hand pain   . Hearing loss    left ear  . Hematuria   . History of atrial fibrillation   . Hyperlipidemia, unspecified   . Hypertension   . Osteoporosis   . Osteoporosis, post-menopausal   . PAF (paroxysmal atrial fibrillation) ([place])   . Polymyalgia ([place])   . Pulmonary hypertension ([place])   . Skin cancer     Patient Active Problem List   Diagnosis Date Noted  . Vascular dementia 03/26/2017  . CHF (congestive heart failure) ([place]) 03/02/2017  . Chronic diastolic heart failure ([place]) 02/13/2017  . HTN (hypertension) 02/13/2017  . Atrial fibrillation with RVR ([place]) 02/02/2017    Past  Surgical History:  Procedure Laterality Date  . BREAST BIOPSY    . INGUINAL HERNIA REPAIR Right 2005    Prior to Admission medications   Medication Sig Start Date End Date Taking? Authorizing Provider  Amino Acids-Protein Hydrolys (FEEDING SUPPLEMENT, PRO-STAT SUGAR FREE 64,) LIQD Take 30 mLs by mouth 2 (two) times daily between meals.    Yes [provider]  amiodarone (PACERONE) 200 MG tablet Take 1 tablet (200 mg total) by mouth daily. 03/07/17  Yes Salary, Avel Peace, MD  apixaban (ELIQUIS) 2.5 MG TABS tablet Take 1 tablet (2.5 mg total) by mouth 2 (two) times daily. 02/07/17  Yes Sainani, Belia Heman, MD  azelastine (ASTELIN) 0.1 % nasal spray Place 2 sprays into both nostrils 2 (two) times daily. Use in each nostril as directed   Yes [provider]  budesonide-formoterol (SYMBICORT) 160-4.5 MCG/ACT inhaler Inhale 2 puffs into the lungs 2 (two) times daily. 03/06/17  Yes Salary, Montell D, MD  donepezil (ARICEPT) 10 MG tablet Take 10 mg by mouth at bedtime.   Yes [provider]  fluticasone (FLONASE) 50 MCG/ACT nasal spray Place 2 sprays into the nose daily.  12/23/16 12/23/17 Yes [provider]  furosemide (LASIX) 40 MG tablet Take 1 tablet (40 mg total) by mouth daily. 03/07/17  Yes Salary, Avel Peace, MD  metoprolol  tartrate (LOPRESSOR) 25 MG tablet Take 0.5 tablets (12.5 mg total) by mouth 2 (two) times daily. 03/06/17  Yes Salary, Avel Peace, MD  Nutritional Supplements (ENSURE ENLIVE PO) Take 1 Bottle by mouth 2 (two) times daily between meals.    Yes [provider]  potassium chloride (MICRO-K) 10 MEQ CR capsule Take 10 mEq by mouth daily.   Yes [provider]  simvastatin (ZOCOR) 20 MG tablet Take 20 mg by mouth daily.   Yes [provider]  acetaminophen (TYLENOL) 325 MG tablet Take 650 mg by mouth every 4 (four) hours as needed.    [provider]  albuterol (PROVENTIL) (2.5 MG/3ML) 0.083% nebulizer solution Take  2.5 mg by nebulization every 6 (six) hours as needed for wheezing or shortness of breath.    [provider]  HYDROcodone-acetaminophen (NORCO) 5-325 MG tablet Take 1 tablet by mouth every 6 (six) hours as needed for up to 15 doses for severe pain. 04/11/17   Darel Hong, MD    Allergies Patient has no known allergies.  Family History  Problem Relation Age of Onset  . Heart failure Sister   . Cancer Sister   . Aneurysm Mother   . Colon cancer Father   . Heart attack Brother   . Cancer Sister     Social History Social History   Tobacco Use  . Smoking status: Former Smoker    Packs/day: 1.00    Years: 25.00    Pack years: 25.00    Types: Cigarettes    Last attempt to quit: 02/24/1978    Years since quitting: 39.1  . Smokeless tobacco: Never Used  Substance Use Topics  . Alcohol use: No  . Drug use: No    Review of Systems  Constitutional: No fever/chills Eyes: No visual changes. ENT: No sore throat. Cardiovascular: Denies chest pain. Respiratory: Denies shortness of breath. Gastrointestinal: No abdominal pain.  No nausea, no vomiting.  No diarrhea.  No constipation. Genitourinary: Negative for dysuria. Musculoskeletal:  back pain. Skin: Negative for rash. Neurological: Negative for headaches, focal weakness   ____________________________________________   PHYSICAL EXAM:  VITAL SIGNS: ED Triage Vitals  Enc Vitals Group     BP 04/13/17 1129 (!) 133/51     Pulse Rate 04/13/17 1129 65     Resp 04/13/17 1129 16     Temp 04/13/17 1129 98 F (36.7 C)     Temp Source 04/13/17 1129 Oral     SpO2 04/13/17 1129 94 %     Weight 04/13/17 1131 110 lb (49.9 kg)     Height 04/13/17 1131 5\' 1"  (1.549 m)     Head Circumference --      Peak Flow --      Pain Score 04/13/17 1130 10     Pain Loc --      Pain Edu? --      Excl. in [place]? --     Constitutional: Alert and oriented. Well appearing and in no acute distress. Eyes: Conjunctivae are normal.  Head:  Atraumatic. Nose: No congestion/rhinnorhea. Mouth/Throat: Mucous membranes are moist.  Oropharynx non-erythematous. Neck: No stridor.  Cardiovascular: Normal rate, regular rhythm. Grossly normal heart sounds.  Good peripheral circulation. Respiratory: Normal respiratory effort.  No retractions. Lungs CTAB.patient has tenderness on palpation of the left lower ribs and upper abdomen laterally. Gastrointestinal: Soft and nontenderexcept as noted above and respiratory.. No distention. No abdominal bruits. No CVA tenderness. Musculoskeletal: No lower extremity tenderness nor edema.  No joint effusions.  Neurologic:  Normal speech and language. No gross focal neurologic deficits are appreciated. No gait instability. Skin:  Skin is warm, dry and intact. No rash noted. Psychiatric: Mood and affect are normal. Speech and behavior are normal.  ____________________________________________   LABS (all labs ordered are listed, but only abnormal results are displayed)  Labs Reviewed - No data to display ____________________________________________  EKG   ____________________________________________  RADIOLOGY  ED MD interpretation:   Official radiology report(s): Ct Abdomen Pelvis W Contrast  Result Date: 04/13/2017 CLINICAL DATA:  84 year old female with lower back and hip pain. Fractured back 2 weeks ago and will not wear brace (too much pain). Subsequent encounter. EXAM: CT ABDOMEN AND PELVIS WITH CONTRAST TECHNIQUE: Multidetector CT imaging of the abdomen and pelvis was performed using the standard protocol following bolus administration of intravenous contrast. CONTRAST:  26mL ISOVUE-300 IOPAMIDOL (ISOVUE-300) INJECTION 61% COMPARISON:  04/11/2017 and 03/02/2017 CT. FINDINGS: Lower chest: Scarring lung bases with granuloma right lung base. Top-normal slightly enlarged heart with prominent size right atrium. Mitral valve calcification. Hepatobiliary: Numerous liver lesions larger ones which are  cysts and others too small to characterize and possibly cysts as there is no reported history of malignancy to suggest metastatic disease. No evidence of liver injury. No calcified gallstone. Pancreas: No pancreatic mass or inflammation. No evidence of pancreatic injury. Spleen: No splenic injury mass or enlargement. Adrenals/Urinary Tract: No obstructing stone or hydronephrosis. No worrisome renal mass. Scattered small cysts. No renal injury identified. Adrenal glands unremarkable. Noncontrast filled views the urinary bladder unremarkable. Stomach/Bowel: No bowel inflammatory process identified. No bowel containing hernia. Prior hernia repair right pelvic region. Vascular/Lymphatic: Atherosclerotic changes aorta with ectasia. Bulge lower abdominal aorta with maximal transverse dimension of 2.5 cm. Atherosclerotic changes aortic branch vessels with narrowing but without large vessel occlusion. Iliac artery narrowing. No adenopathy. Reproductive: No worrisome uterine or adnexal abnormality. Other: No free intraperitoneal air. Musculoskeletal: Acute/subacute T12 compression fracture involving superior endplate with 85% loss of height and mild retropulsion (3.5 mm) with mild contact with the ventral aspect of the adjacent cord. Appearance similar to the 04/11/2017 exam. Chronic bilateral L5 pars defects with grade 1 anterior slip and mild bilateral foraminal narrowing without significant compression of the exiting L5 nerve root. L1-2 minimal bulge greater to the right. L2-3 broad-based bulge with mild spinal stenosis. L3-4 minimal to mild bulge. No hip fracture detected. IMPRESSION: Acute/subacute T12 compression fracture involving superior endplate with 88% loss of height and mild retropulsion (3.5 mm) with mild contact with the ventral aspect of the adjacent cord. Appearance similar to the 04/11/2017 exam. Chronic bilateral L5 pars defects with grade 1 anterior slip and mild bilateral foraminal narrowing without  significant compression of the exiting L5 nerve root. No right hip fracture. No visceral injury noted. Probable liver cysts. Prior right inguinal hernia repair without recurrent hernia. Ectatic aorta measuring up to 2.5 cm. Ectatic abdominal aorta at risk for aneurysm development. Recommend followup by ultrasound in 5 years. This recommendation follows ACR consensus guidelines: White Paper of the ACR Incidental Findings Committee II on Vascular Findings. J Am Coll Radiol 2013; 10:789-794. Electronically Signed   By: Genia Del M.D.   On: 04/13/2017 12:25    ____________________________________________   PROCEDURES  Procedure(s) performed:   Procedures  Critical Care performed:   ____________________________________________   INITIAL IMPRESSION / ASSESSMENT AND PLAN / ED COURSE  discussed with Krystal White a director of the care facility. When over the plan with her she will contact the  patient's doctor which I have not been able to do and she will also contact the patient's DSS guardian. She reports she's tried today with the office is now closed. She will contact him tomorrow.         ____________________________________________   FINAL CLINICAL IMPRESSION(S) / ED DIAGNOSES  Final diagnoses:  Closed compression fracture of thoracic vertebra with delayed healing, subsequent encounter     ED Discharge Orders    None       Note:  This document was prepared using Dragon voice recognition software and may include unintentional dictation errors.    Nena Polio, MD 04/13/17 1515

## 2017-04-13 NOTE — ED Notes (Signed)
Per [facility] pt has to be returned to facility by ems when she is discharged - secretary notified to call ems for transport

## 2017-04-13 NOTE — ED Notes (Signed)
PT pressed call bell and reported her head pain has worsened and she is having left side pain. RN asked if pain had just started and pt reports, "no it has always been there" head of bed adjusted and pt reports feeling a little more comfortable. RN made aware.

## 2017-04-15 NOTE — Pre-Procedure Instructions (Signed)
PREOP INSTRUCTIONS GIVEN TO Krystal White PHONE - VERBALIZED UNDERSTANDING

## 2017-04-15 NOTE — Patient Instructions (Signed)
Your procedure is scheduled DT:OIZTIWPY 22, 2019  Report to Same Day Surgery on the 2nd floor in the [facility]. To find out your arrival time, please call [phone] between 1PM - 3PM on: April 16, 2017  REMEMBER: Instructions that are not followed completely may result in serious medical risk, up to and including death; or upon the discretion of your surgeon and anesthesiologist your surgery may need to be rescheduled.  Do not eat OR DRINK after midnight the night before your procedure.  No gum chewing or hard candies.   No Alcohol for 24 hours before or after surgery.  No Smoking including e-cigarettes for 24 hours prior to surgery. No chewable tobacco products for at least 6 hours prior to surgery. No nicotine patches on the day of surgery.  On the morning of surgery brush your teeth with toothpaste and water, you may rinse your mouth with mouthwash if you wish. Do not swallow any  toothpaste of mouthwash.  Notify your doctor if there is any change in your medical condition (cold, fever, infection).  Do not wear jewelry, make-up, hairpins, clips or nail polish.  Do not wear lotions, powders, or perfumes. You may wear deodorant.  Do not shave 48 hours prior to surgery. Men may shave face and neck.  Contacts and dentures may not be worn into surgery.  Do not bring valuables to the hospital. [facility] is not responsible for any belongings or valuables.   TAKE THESE MEDICATIONS THE MORNING OF SURGERY WITH A SIP OF WATER: SIMVASTATIN METOPROLOL AMIODARONE PERCOCET IF NEEDED  Use inhalers AND NASAL SPRAY on the day of surgery and bring to the hospital.  Follow recommendations from Cardiologist, Pulmonologist or PCP regarding stopping Aspirin, Coumadin, Plavix, Eliquis, Pradaxa, or Pletal IS ON HOLD.  Stop Anti-inflammatories such as Advil, Aleve, Ibuprofen, Motrin, Naproxen, Naprosyn, Goodie powder, or aspirin products. (May take Tylenol or Acetaminophen if  needed.)  Stop ANY OVER THE COUNTER supplements until after surgery. (May continue Vitamin D, Vitamin B, and multivitamin.)  If you are being admitted to the hospital overnight, leave your suitcase in the car. After surgery it may be brought to your room.  If you are being discharged the day of surgery, you will not be allowed to drive home. You will need someone to drive you home and stay with you that night.   If you are taking public transportation, you will need to have a responsible adult to with you.  Please call the number above if you have any questions about these instructions.

## 2017-04-16 MED ORDER — DEXTROSE 5 % IV SOLN
1000.0000 mg | Freq: Once | INTRAVENOUS | Status: AC
  Administered 2017-04-17: 1000 mg via INTRAVENOUS
  Filled 2017-04-16: qty 10

## 2017-04-17 ENCOUNTER — Encounter
Admission: RE | Disposition: A | Discharge status: Home / Self Care | Payer: Self-pay | Source: Ambulatory Visit | Attending: Orthopedic Surgery

## 2017-04-17 ENCOUNTER — Ambulatory Visit: Payer: Medicare Other | Admitting: Certified Registered"

## 2017-04-17 ENCOUNTER — Encounter: Payer: Self-pay | Admitting: Certified Registered"

## 2017-04-17 ENCOUNTER — Ambulatory Visit
Admission: RE | Admit: 2017-04-17 | Discharge: 2017-04-17 | Disposition: A | Discharge status: Home / Self Care | Payer: Medicare Other | Source: Ambulatory Visit | Attending: Orthopedic Surgery | Admitting: Orthopedic Surgery

## 2017-04-17 ENCOUNTER — Ambulatory Visit: Payer: Medicare Other

## 2017-04-17 DIAGNOSIS — I4891 Unspecified atrial fibrillation: Secondary | ICD-10-CM | POA: Diagnosis not present

## 2017-04-17 DIAGNOSIS — I509 Heart failure, unspecified: Secondary | ICD-10-CM | POA: Diagnosis not present

## 2017-04-17 DIAGNOSIS — J449 Chronic obstructive pulmonary disease, unspecified: Secondary | ICD-10-CM | POA: Insufficient documentation

## 2017-04-17 DIAGNOSIS — Z87891 Personal history of nicotine dependence: Secondary | ICD-10-CM | POA: Diagnosis not present

## 2017-04-17 DIAGNOSIS — T148XXA Other injury of unspecified body region, initial encounter: Secondary | ICD-10-CM | POA: Diagnosis present

## 2017-04-17 DIAGNOSIS — I10 Essential (primary) hypertension: Secondary | ICD-10-CM | POA: Insufficient documentation

## 2017-04-17 DIAGNOSIS — S22089A Unspecified fracture of T11-T12 vertebra, initial encounter for closed fracture: Secondary | ICD-10-CM | POA: Diagnosis not present

## 2017-04-17 DIAGNOSIS — F0390 Unspecified dementia without behavioral disturbance: Secondary | ICD-10-CM | POA: Diagnosis not present

## 2017-04-17 DIAGNOSIS — Z7901 Long term (current) use of anticoagulants: Secondary | ICD-10-CM | POA: Diagnosis not present

## 2017-04-17 DIAGNOSIS — E785 Hyperlipidemia, unspecified: Secondary | ICD-10-CM | POA: Diagnosis not present

## 2017-04-17 DIAGNOSIS — Z79899 Other long term (current) drug therapy: Secondary | ICD-10-CM | POA: Insufficient documentation

## 2017-04-17 DIAGNOSIS — H9192 Unspecified hearing loss, left ear: Secondary | ICD-10-CM | POA: Diagnosis not present

## 2017-04-17 DIAGNOSIS — I110 Hypertensive heart disease with heart failure: Secondary | ICD-10-CM | POA: Insufficient documentation

## 2017-04-17 DIAGNOSIS — X58XXXA Exposure to other specified factors, initial encounter: Secondary | ICD-10-CM | POA: Insufficient documentation

## 2017-04-17 DIAGNOSIS — M810 Age-related osteoporosis without current pathological fracture: Secondary | ICD-10-CM | POA: Insufficient documentation

## 2017-04-17 DIAGNOSIS — M353 Polymyalgia rheumatica: Secondary | ICD-10-CM | POA: Diagnosis not present

## 2017-04-17 HISTORY — PX: KYPHOPLASTY: SHX5884

## 2017-04-17 SURGERY — KYPHOPLASTY
Anesthesia: General

## 2017-04-17 MED ORDER — FAMOTIDINE 20 MG PO TABS
ORAL_TABLET | ORAL | Status: AC
  Filled 2017-04-17: qty 1

## 2017-04-17 MED ORDER — ONDANSETRON HCL 4 MG/2ML IJ SOLN: 4.0000 mg | Freq: Four times a day (QID) | INTRAMUSCULAR | Status: DC | PRN

## 2017-04-17 MED ORDER — GLYCOPYRROLATE 0.2 MG/ML IJ SOLN
INTRAMUSCULAR | Status: DC | PRN
  Administered 2017-04-17: 0.1 mg via INTRAVENOUS

## 2017-04-17 MED ORDER — IOPAMIDOL (ISOVUE-M 200) INJECTION 41%
INTRAMUSCULAR | Status: DC | PRN
  Administered 2017-04-17: 40 mL

## 2017-04-17 MED ORDER — LIDOCAINE HCL (PF) 2 % IJ SOLN
INTRAMUSCULAR | Status: AC
Start: 2017-04-17 — End: 2017-04-17
  Filled 2017-04-17: qty 10

## 2017-04-17 MED ORDER — ONDANSETRON HCL 4 MG/2ML IJ SOLN
INTRAMUSCULAR | Status: DC | PRN
  Administered 2017-04-17: 4 mg via INTRAVENOUS

## 2017-04-17 MED ORDER — ONDANSETRON HCL 4 MG/2ML IJ SOLN: 4.0000 mg | Freq: Once | INTRAMUSCULAR | Status: DC | PRN

## 2017-04-17 MED ORDER — FENTANYL CITRATE (PF) 100 MCG/2ML IJ SOLN
INTRAMUSCULAR | Status: AC
  Filled 2017-04-17: qty 2

## 2017-04-17 MED ORDER — FENTANYL CITRATE (PF) 100 MCG/2ML IJ SOLN
INTRAMUSCULAR | Status: DC | PRN
  Administered 2017-04-17: 50 ug via INTRAVENOUS
  Administered 2017-04-17 (×2): 25 ug via INTRAVENOUS

## 2017-04-17 MED ORDER — LIDOCAINE HCL 1 % IJ SOLN
INTRAMUSCULAR | Status: DC | PRN
  Administered 2017-04-17: 25 mL

## 2017-04-17 MED ORDER — LACTATED RINGERS IV SOLN
INTRAVENOUS | Status: DC
  Administered 2017-04-17 (×2): via INTRAVENOUS

## 2017-04-17 MED ORDER — METOCLOPRAMIDE HCL 5 MG/ML IJ SOLN: 5.0000 mg | Freq: Three times a day (TID) | INTRAMUSCULAR | Status: DC | PRN

## 2017-04-17 MED ORDER — EPHEDRINE SULFATE 50 MG/ML IJ SOLN
INTRAMUSCULAR | Status: DC | PRN
  Administered 2017-04-17: 10 mg via INTRAVENOUS

## 2017-04-17 MED ORDER — LACTATED RINGERS IV SOLN: INTRAVENOUS | Status: DC

## 2017-04-17 MED ORDER — FUROSEMIDE 10 MG/ML IJ SOLN
20.0000 mg | Freq: Once | INTRAMUSCULAR | Status: AC
  Administered 2017-04-17: 20 mg via INTRAVENOUS

## 2017-04-17 MED ORDER — IPRATROPIUM-ALBUTEROL 0.5-2.5 (3) MG/3ML IN SOLN
RESPIRATORY_TRACT | Status: AC
  Administered 2017-04-17: 3 mL via RESPIRATORY_TRACT
  Filled 2017-04-17: qty 3

## 2017-04-17 MED ORDER — PROPOFOL 500 MG/50ML IV EMUL
INTRAVENOUS | Status: DC | PRN
  Administered 2017-04-17: 75 ug/kg/min via INTRAVENOUS

## 2017-04-17 MED ORDER — BUPIVACAINE-EPINEPHRINE (PF) 0.5% -1:200000 IJ SOLN
INTRAMUSCULAR | Status: DC | PRN
  Administered 2017-04-17: 15 mL via PERINEURAL

## 2017-04-17 MED ORDER — FUROSEMIDE 10 MG/ML IJ SOLN
INTRAMUSCULAR | Status: AC
  Administered 2017-04-17: 20 mg via INTRAVENOUS
  Filled 2017-04-17: qty 2

## 2017-04-17 MED ORDER — EPHEDRINE SULFATE 50 MG/ML IJ SOLN
INTRAMUSCULAR | Status: AC
  Filled 2017-04-17: qty 1

## 2017-04-17 MED ORDER — PROPOFOL 500 MG/50ML IV EMUL
INTRAVENOUS | Status: AC
  Filled 2017-04-17: qty 50

## 2017-04-17 MED ORDER — LIDOCAINE HCL (CARDIAC) 20 MG/ML IV SOLN
INTRAVENOUS | Status: DC | PRN
  Administered 2017-04-17: 50 mg via INTRAVENOUS

## 2017-04-17 MED ORDER — KETAMINE HCL 10 MG/ML IJ SOLN
INTRAMUSCULAR | Status: DC | PRN
  Administered 2017-04-17 (×3): 10 mg via INTRAVENOUS

## 2017-04-17 MED ORDER — ACETAMINOPHEN 10 MG/ML IV SOLN
INTRAVENOUS | Status: AC
  Filled 2017-04-17: qty 100

## 2017-04-17 MED ORDER — SODIUM CHLORIDE 0.9 % IV SOLN: INTRAVENOUS | Status: DC

## 2017-04-17 MED ORDER — ONDANSETRON HCL 4 MG/2ML IJ SOLN
INTRAMUSCULAR | Status: AC
  Filled 2017-04-17: qty 2

## 2017-04-17 MED ORDER — DEXAMETHASONE SODIUM PHOSPHATE 10 MG/ML IJ SOLN
INTRAMUSCULAR | Status: AC
  Filled 2017-04-17: qty 1

## 2017-04-17 MED ORDER — PHENYLEPHRINE HCL 10 MG/ML IJ SOLN
INTRAMUSCULAR | Status: AC
  Filled 2017-04-17: qty 1

## 2017-04-17 MED ORDER — METOPROLOL TARTRATE 25 MG PO TABS
ORAL_TABLET | ORAL | Status: AC
  Filled 2017-04-17: qty 1

## 2017-04-17 MED ORDER — KETAMINE HCL 50 MG/ML IJ SOLN
INTRAMUSCULAR | Status: AC
  Filled 2017-04-17: qty 10

## 2017-04-17 MED ORDER — ACETAMINOPHEN 10 MG/ML IV SOLN
INTRAVENOUS | Status: DC | PRN
  Administered 2017-04-17: 1000 mg via INTRAVENOUS

## 2017-04-17 MED ORDER — FENTANYL CITRATE (PF) 100 MCG/2ML IJ SOLN
25.0000 ug | INTRAMUSCULAR | Status: DC | PRN
  Administered 2017-04-17: 25 ug via INTRAVENOUS

## 2017-04-17 MED ORDER — GLYCOPYRROLATE 0.2 MG/ML IJ SOLN
INTRAMUSCULAR | Status: AC
  Filled 2017-04-17: qty 1

## 2017-04-17 MED ORDER — IPRATROPIUM-ALBUTEROL 0.5-2.5 (3) MG/3ML IN SOLN
3.0000 mL | RESPIRATORY_TRACT | Status: DC
  Administered 2017-04-17: 3 mL via RESPIRATORY_TRACT

## 2017-04-17 MED ORDER — FAMOTIDINE 20 MG PO TABS
20.0000 mg | ORAL_TABLET | Freq: Once | ORAL | Status: AC
  Administered 2017-04-17: 20 mg via ORAL

## 2017-04-17 MED ORDER — ONDANSETRON HCL 4 MG PO TABS: 4.0000 mg | ORAL_TABLET | Freq: Four times a day (QID) | ORAL | Status: DC | PRN

## 2017-04-17 MED ORDER — HYDROCODONE-ACETAMINOPHEN 5-325 MG PO TABS: 1.0000 | ORAL_TABLET | ORAL | Status: DC | PRN

## 2017-04-17 MED ORDER — METOCLOPRAMIDE HCL 10 MG PO TABS: 5.0000 mg | ORAL_TABLET | Freq: Three times a day (TID) | ORAL | Status: DC | PRN

## 2017-04-17 MED ORDER — CEFAZOLIN SODIUM-DEXTROSE 1-4 GM/50ML-% IV SOLN
INTRAVENOUS | Status: AC
  Filled 2017-04-17: qty 50

## 2017-04-17 MED ORDER — METOPROLOL TARTRATE 25 MG PO TABS
25.0000 mg | ORAL_TABLET | Freq: Once | ORAL | Status: AC
  Administered 2017-04-17: 25 mg via ORAL

## 2017-04-17 MED ORDER — DEXAMETHASONE SODIUM PHOSPHATE 10 MG/ML IJ SOLN
INTRAMUSCULAR | Status: DC | PRN
  Administered 2017-04-17: 10 mg via INTRAVENOUS

## 2017-04-17 SURGICAL SUPPLY — 15 items
CEMENT KYPHON CX01A KIT/MIXER (Cement) ×3 IMPLANT
DERMABOND ADVANCED (GAUZE/BANDAGES/DRESSINGS) ×2
DERMABOND ADVANCED .7 DNX12 (GAUZE/BANDAGES/DRESSINGS) ×1 IMPLANT
DEVICE BIOPSY BONE KYPHX (INSTRUMENTS) ×3 IMPLANT
DRAPE C-ARM XRAY 36X54 (DRAPES) ×3 IMPLANT
DURAPREP 26ML APPLICATOR (WOUND CARE) ×3 IMPLANT
GLOVE SURG SYN 9.0  PF PI (GLOVE) ×2
GLOVE SURG SYN 9.0 PF PI (GLOVE) ×1 IMPLANT
GOWN SRG 2XL LVL 4 RGLN SLV (GOWNS) ×1 IMPLANT
GOWN STRL NON-REIN 2XL LVL4 (GOWNS) ×2
GOWN STRL REUS W/ TWL LRG LVL3 (GOWN DISPOSABLE) ×1 IMPLANT
GOWN STRL REUS W/TWL LRG LVL3 (GOWN DISPOSABLE) ×2
PACK KYPHOPLASTY (MISCELLANEOUS) ×3 IMPLANT
STRAP SAFETY 5IN WIDE (MISCELLANEOUS) ×3 IMPLANT
TRAY KYPHOPAK 15/3 EXPRESS 1ST (MISCELLANEOUS) ×3 IMPLANT

## 2017-04-17 NOTE — Discharge Instructions (Addendum)
Activity as tolerated.  Remove Band-Aid on Sunday and then okay to shower.  Call office if any problems [phone]    AMBULATORY SURGERY  DISCHARGE INSTRUCTIONS   1) The drugs that you were given will stay in your system until tomorrow so for the next 24 hours you should not:  A) Drive an automobile B) Make any legal decisions C) Drink any alcoholic beverage   2) You may resume regular meals tomorrow.  Today it is better to start with liquids and gradually work up to solid foods.  You may eat anything you prefer, but it is better to start with liquids, then soup and crackers, and gradually work up to solid foods.   3) Please notify your doctor immediately if you have any unusual bleeding, trouble breathing, redness and pain at the surgery site, drainage, fever, or pain not relieved by medication.    4) Additional Instructions:        Please contact your physician with any problems or Same Day Surgery at [phone], Monday through Friday 6 am to 4 pm, or [place] at [facility] number at [phone].

## 2017-04-17 NOTE — Anesthesia Post-op Follow-up Note (Signed)
Anesthesia QCDR form completed.        

## 2017-04-17 NOTE — OR Nursing (Signed)
Pt. Noted to have dark old appearing bruise on left buttock area. Pt. Care giver aware and unsure of how this happened.

## 2017-04-17 NOTE — Anesthesia Postprocedure Evaluation (Signed)
Anesthesia Post Note  Patient: Krystal White  Procedure(s) Performed: Coralyn Helling (N/A )  Patient location during evaluation: PACU Anesthesia Type: General Level of consciousness: awake and alert Pain management: pain level controlled Vital Signs Assessment: post-procedure vital signs reviewed and stable Respiratory status: spontaneous breathing and respiratory function stable Cardiovascular status: stable Anesthetic complications: no     Last Vitals:  Vitals:   04/17/17 1209 04/17/17 1215  BP:  (!) 172/54  Pulse:  65  Resp:  17  Temp:    SpO2: 100% 98%    Last Pain:  Vitals:   04/17/17 0949  TempSrc: Oral  PainSc: 6                  [name] K

## 2017-04-17 NOTE — Transfer of Care (Signed)
Immediate Anesthesia Transfer of Care Note  Patient: Krystal White  Procedure(s) Performed: Coralyn Helling (N/A )  Patient Location: PACU  Anesthesia Type:MAC  Level of Consciousness: awake, alert  and patient cooperative  Airway & Oxygen Therapy: Patient Spontanous Breathing and Patient connected to nasal cannula oxygen  Post-op Assessment: Report given to RN, Post -op Vital signs reviewed and stable and Patient moving all extremities  Post vital signs: Reviewed and stable  Last Vitals:  Vitals:   04/17/17 0949 04/17/17 1157  BP: (!) 178/45 (!) 120/48  Pulse: 65 69  Resp: 16 13  Temp: 37 C (!) 36.2 C  SpO2: 94% 97%    Last Pain:  Vitals:   04/17/17 0949  TempSrc: Oral  PainSc: 6          Complications: No apparent anesthesia complications

## 2017-04-17 NOTE — Op Note (Signed)
04/17/2017  12:06 PM  PATIENT:  Krystal White  84 y.o. female  PRE-OPERATIVE DIAGNOSIS:  COMPRESSION FRACTURE T12  POST-OPERATIVE DIAGNOSIS:  COMPRESSION FRACTURE T12  PROCEDURE:  Procedure(s): KYPHOPLASTY-T12 (N/A)  SURGEON: Laurene Footman, MD  ASSISTANTS: None  ANESTHESIA:   local and MAC  EBL:  Total I/O In: 700 [I.V.:700] Out: 0   BLOOD ADMINISTERED:none  DRAINS: none   LOCAL MEDICATIONS USED:  MARCAINE    and XYLOCAINE   SPECIMEN:  No Specimen  DISPOSITION OF SPECIMEN:  N/A  COUNTS:  YES  TOURNIQUET:  * No tourniquets in log *  IMPLANTS: Bone cement  DICTATION: .Dragon Dictation patient brought the operating room and after adequate anesthesia was obtained the patient was placed prone.  Serum was brought into good visualization of both AP and lateral views of T12 were obtained.  After patient identification and timeout procedure local anesthetic was overtreated on the right side with 1% Xylocaine subcutaneously.  After prepping and draped in the sterile manner fashion repeat timeout procedure was carried out spinal needle was used to get local down to these pedicle of T12.  Local anesthetic was infiltrated.  A small incision was made and trocar advanced in an extrapedicular fashion into the vertebral body biopsy was attempted but there was no bone obtained.  Drilling was carried out placement of a balloon carried out and across the midline so a second stick was not required inflation to 3-1/2 cc with partial correction of the compression deformity.  After the cement was of the appropriate consistency the balloon was let down and approximately 4-1/2 cc of cement inserted into T12 with good interdigitation and fill from left to right side superior to inferior after the cement was set the trochars removed and permanent serial views were obtained.  The wound was closed with Dermabond followed by a Band-Aid  PLAN OF CARE: Discharge to home after PACU  PATIENT  DISPOSITION:  PACU - hemodynamically stable.

## 2017-04-17 NOTE — Progress Notes (Signed)
Chaplain offered silent prayer for patient and staff in PACU.

## 2017-04-17 NOTE — H&P (Signed)
Reviewed paper H+P, will be scanned into chart. No changes noted.  

## 2017-04-17 NOTE — OR Nursing (Signed)
Pt incontinent of urine - large - amount clear yellow. Clothes changes   Diaper applied scrub pants applied. Pt moving all extremities well.  Able to stand and turn to sit in wheelchair. Lifted legs to move legs around to front of wheelchair.

## 2017-04-17 NOTE — Anesthesia Preprocedure Evaluation (Signed)
Anesthesia Evaluation  Patient identified by MRN, date of birth, ID band Patient confused    Reviewed: Allergy & Precautions, NPO status , Patient's Chart, lab work & pertinent test results, Unable to perform ROS - Chart review only  History of Anesthesia Complications Negative for: history of anesthetic complications  Airway Mallampati: III       Dental  (+) Upper Dentures, Lower Dentures   Pulmonary neg sleep apnea, COPD,  COPD inhaler, former smoker,           Cardiovascular hypertension, Pt. on medications and Pt. on home beta blockers +CHF  (-) Past MI (-) dysrhythmias (-) Valvular Problems/Murmurs     Neuro/Psych neg Seizures Anxiety Dementia    GI/Hepatic Neg liver ROS, neg GERD  ,  Endo/Other  neg diabetes  Renal/GU negative Renal ROS     Musculoskeletal   Abdominal   Peds  Hematology   Anesthesia Other Findings   Reproductive/Obstetrics                             Anesthesia Physical Anesthesia Plan  ASA: III  Anesthesia Plan: General   Post-op Pain Management:    Induction: Intravenous  PONV Risk Score and Plan: 3 and Dexamethasone, Ondansetron, TIVA and Propofol infusion  Airway Management Planned: Nasal Cannula  Additional Equipment:   Intra-op Plan:   Post-operative Plan:   Informed Consent: I have reviewed the patients History and Physical, chart, labs and discussed the procedure including the risks, benefits and alternatives for the proposed anesthesia with the patient or authorized representative who has indicated his/her understanding and acceptance.     Plan Discussed with:   Anesthesia Plan Comments:         Anesthesia Quick Evaluation

## 2017-04-28 DIAGNOSIS — E46 Unspecified protein-calorie malnutrition: Secondary | ICD-10-CM | POA: Insufficient documentation

## 2017-04-28 DIAGNOSIS — R1310 Dysphagia, unspecified: Secondary | ICD-10-CM | POA: Insufficient documentation

## 2017-06-20 NOTE — Progress Notes (Signed)
Patient ID: Krystal White, female    DOB: November 14, 1924, 84 y.o.   MRN: [id number]  HPI  Krystal White is a 84 y/o female with a history of HTN, atrial fibrillation, osteoporosis and chronic heart failure.   Echo report from 02/02/17 reviewed and showed an EF of 55-60% along with moderate TR, mild AR/MR and severely elevated PA pressure of 65 mm Hg.  Admitted 04/17/17 due to kyphoplasty T12 and discharged the same day. Was in the ED 04/13/17 due to back pain from compression fracture. She was evaluated and released. Was in the ED 04/11/17 due to worsening back pain. CXR and CT scan showed acute thoracic fracture. Placed in back brace and released. Admitted 03/02/17 due to HF exacerbation. Cardiology consult obtained. Initially needed IV diuretics and then transitioned to oral diuretics. Medications adjusted and she was discharged to SNF after 5 day. Admitted 02/02/17 due to acute HF along with AF with RVR. Initially needed IV diuretics along with IV cardizem and IV metoprolol. Cardiology consult obtained. Oral medications were adjusted. Discharged home after 5 days.   She presents today for a follow-up visit with a chief complaint of mild fatigue upon minimal exertion. She describes this as chronic in nature having been present for several years. She has associated chronic difficulty sleeping, edema, rhinorrhea, cough and headaches related to pollen. She denies any weight gain, abdominal distention, palpitations, chest pain, shortness of breath or dizziness. Says that she's had allergies for years and will be seeing the PCP at [facility] tomorrow  Past Medical History:  Diagnosis Date  . Allergic rhinitis   . Anxiety    unspecified  . Back pain    unspecified  . Cystitis   . Edema   . Gait abnormality   . Hand pain   . Hearing loss    left ear  . Hematuria   . History of atrial fibrillation   . Hyperlipidemia, unspecified   . Hypertension   . Osteoporosis   . Osteoporosis, post-menopausal    . PAF (paroxysmal atrial fibrillation) ([place])   . Polymyalgia ([place])   . Pulmonary hypertension ([place])   . Skin cancer    Past Surgical History:  Procedure Laterality Date  . BREAST BIOPSY    . INGUINAL HERNIA REPAIR Right 2005  . KYPHOPLASTY N/A 04/17/2017   Procedure: [id number];  Surgeon: Hessie Knows, MD;  Location: [facility] ORS;  Service: Orthopedics;  Laterality: N/A;   Family History  Problem Relation Age of Onset  . Heart failure Sister   . Cancer Sister   . Aneurysm Mother   . Colon cancer Father   . Heart attack Brother   . Cancer Sister    Social History   Tobacco Use  . Smoking status: Former Smoker    Packs/day: 1.00    Years: 25.00    Pack years: 25.00    Types: Cigarettes    Last attempt to quit: 02/24/1978    Years since quitting: 39.3  . Smokeless tobacco: Never Used  Substance Use Topics  . Alcohol use: No   No Known Allergies  Prior to Admission medications   Medication Sig Start Date End Date Taking? Authorizing Provider  acetaminophen (TYLENOL) 325 MG tablet Take 650 mg by mouth every 4 (four) hours as needed.   Yes [provider]  albuterol (PROVENTIL HFA;VENTOLIN HFA) 108 (90 Base) MCG/ACT inhaler Inhale 2 puffs into the lungs every 6 (six) hours as needed for wheezing or shortness of breath.   Yes [provider]  Amino Acids-Protein Hydrolys (FEEDING SUPPLEMENT, PRO-STAT SUGAR FREE 64,) LIQD Take 30 mLs by mouth 2 (two) times daily between meals.    Yes [provider]  amiodarone (PACERONE) 200 MG tablet Take 1 tablet (200 mg total) by mouth daily. 03/07/17  Yes Salary, Avel Peace, MD  apixaban (ELIQUIS) 2.5 MG TABS tablet Take 1 tablet (2.5 mg total) by mouth 2 (two) times daily. 02/07/17  Yes Sainani, Belia Heman, MD  azelastine (ASTELIN) 0.1 % nasal spray Place 2 sprays into both nostrils 2 (two) times daily. Use in each nostril as directed   Yes [provider]  budesonide-formoterol (SYMBICORT) 160-4.5 MCG/ACT  inhaler Inhale 2 puffs into the lungs 2 (two) times daily. 03/06/17  Yes Salary, Montell D, MD  donepezil (ARICEPT) 10 MG tablet Take 10 mg by mouth at bedtime.   Yes [provider]  fluticasone (FLONASE) 50 MCG/ACT nasal spray Place 2 sprays into the nose daily.  12/23/16 12/23/17 Yes [provider]  furosemide (LASIX) 40 MG tablet Take 1 tablet (40 mg total) by mouth daily. 03/07/17  Yes Salary, Avel Peace, MD  lactose free nutrition (BOOST) LIQD Take 237 mLs by mouth 2 (two) times daily between meals.   Yes [provider]  metoprolol tartrate (LOPRESSOR) 25 MG tablet Take 0.5 tablets (12.5 mg total) by mouth 2 (two) times daily. 03/06/17  Yes Salary, Avel Peace, MD  oxyCODONE-acetaminophen (PERCOCET/ROXICET) 5-325 MG tablet Take 1-2 tablets by mouth every 6 (six) hours as needed for severe pain. Patient taking differently: Take 1-2 tablets by mouth every 6 (six) hours as needed for severe pain. 2 tab q6h prn severe pain 04/13/17  Yes Nena Polio, MD  potassium chloride (MICRO-K) 10 MEQ CR capsule Take 10 mEq by mouth daily.   Yes [provider]  simvastatin (ZOCOR) 20 MG tablet Take 20 mg by mouth daily.   Yes [provider]  albuterol (PROVENTIL) (2.5 MG/3ML) 0.083% nebulizer solution Take 2.5 mg by nebulization every 6 (six) hours as needed for wheezing or shortness of breath.    [provider]  Nutritional Supplements (ENSURE ENLIVE PO) Take 1 Bottle by mouth 2 (two) times daily between meals.     [provider]   Review of Systems  Constitutional: Positive for fatigue. Negative for appetite change.  HENT: Positive for hearing loss and rhinorrhea. Negative for congestion and sore throat.   Eyes: Negative.   Respiratory: Positive for cough (dry cough). Negative for chest tightness and shortness of breath.   Cardiovascular: Positive for leg swelling. Negative for chest pain and palpitations.  Gastrointestinal: Negative for  abdominal distention and abdominal pain.  Endocrine: Negative.   Genitourinary: Negative.   Musculoskeletal: Negative for back pain and neck pain.  Skin: Negative.   Allergic/Immunologic: Negative.   Neurological: Positive for headaches. Negative for dizziness and light-headedness.  Hematological: Negative for adenopathy. Does not bruise/bleed easily.  Psychiatric/Behavioral: Positive for sleep disturbance (sleeping more during the day). Negative for dysphoric mood. The patient is not nervous/anxious.    Vitals:   06/22/17 1008  BP: (!) 140/93  Pulse: (!) 59  Resp: 18  SpO2: 96%  Weight: 112 lb 4 oz (50.9 kg)  Height: 5\' 1"  (1.549 m)   Wt Readings from Last 3 Encounters:  06/22/17 112 lb 4 oz (50.9 kg)  04/17/17 110 lb (49.9 kg)  04/13/17 110 lb (49.9 kg)   Lab Results  Component Value Date   CREATININE 1.05 (H) 04/11/2017  CREATININE 0.90 04/01/2017   CREATININE 1.20 (H) 03/24/2017    Physical Exam  Constitutional: She is oriented to person, place, and time. She appears well-developed and well-nourished.  HENT:  Head: Normocephalic and atraumatic.  Right Ear: Decreased hearing is noted.  Left Ear: Decreased hearing is noted.  Neck: Normal range of motion. Neck supple. No JVD present.  Cardiovascular: Normal rate and regular rhythm.  Pulmonary/Chest: Effort normal. She has no wheezes. She has no rales.  Abdominal: Soft. She exhibits no distension. There is no tenderness.  Musculoskeletal: She exhibits edema (trace edema around ankles). She exhibits no tenderness.  Neurological: She is alert and oriented to person, place, and time.  Skin: Skin is warm and dry.  Psychiatric: She has a normal mood and affect. Her behavior is normal. Thought content normal.  Nursing note and vitals reviewed.  Assessment & Plan:  1: Chronic heart failure with preserved ejection fraction- - NYHA class II - euvolemic today - being weighed daily at [facility]. Reminded  staff member to call for an overnight weight gain of >2 pounds or a weekly weight gain of >5 pounds - weight stable from last time she was here  - not adding salt and staff member says that the food isn't cooked with salt either - encouraged her to elevate her legs when she sits for long periods of time - wearing TED hose daily with removal at bedtime - BNP from 03/02/17 was 1577.0 - PharmD reconciled medications with the patient  2: HTN- - BP looks good today - saw PCP Baldemar Lenis) 12/23/16 & will be seeing PCP at [facility] tomorrow - BMP from 04/11/17 reviewed and showed sodium 138, potassium 4.0 and GFR 45  3: Atrial fibrillation- - currently rate controlled at this time - currently on amiodarone, metoprolol succinate and apixaban - hasn't noticed any increase in bleeding/bruising  Facility medication list was reviewed.  Return in 6 months or sooner for any questions/problems before then.

## 2017-06-22 ENCOUNTER — Ambulatory Visit: Payer: Medicare Other | Attending: Family | Admitting: Family

## 2017-06-22 ENCOUNTER — Encounter: Payer: Self-pay | Admitting: Family

## 2017-06-22 VITALS — BP 140/93 | HR 59 | Resp 18 | Ht 61.0 in | Wt 112.2 lb

## 2017-06-22 DIAGNOSIS — I272 Pulmonary hypertension, unspecified: Secondary | ICD-10-CM | POA: Diagnosis not present

## 2017-06-22 DIAGNOSIS — M549 Dorsalgia, unspecified: Secondary | ICD-10-CM | POA: Diagnosis not present

## 2017-06-22 DIAGNOSIS — X58XXXA Exposure to other specified factors, initial encounter: Secondary | ICD-10-CM | POA: Diagnosis not present

## 2017-06-22 DIAGNOSIS — I480 Paroxysmal atrial fibrillation: Secondary | ICD-10-CM | POA: Insufficient documentation

## 2017-06-22 DIAGNOSIS — I509 Heart failure, unspecified: Secondary | ICD-10-CM | POA: Diagnosis present

## 2017-06-22 DIAGNOSIS — Z7951 Long term (current) use of inhaled steroids: Secondary | ICD-10-CM | POA: Insufficient documentation

## 2017-06-22 DIAGNOSIS — Z85828 Personal history of other malignant neoplasm of skin: Secondary | ICD-10-CM | POA: Diagnosis not present

## 2017-06-22 DIAGNOSIS — Z7901 Long term (current) use of anticoagulants: Secondary | ICD-10-CM | POA: Insufficient documentation

## 2017-06-22 DIAGNOSIS — M353 Polymyalgia rheumatica: Secondary | ICD-10-CM | POA: Insufficient documentation

## 2017-06-22 DIAGNOSIS — M4850XA Collapsed vertebra, not elsewhere classified, site unspecified, initial encounter for fracture: Secondary | ICD-10-CM | POA: Diagnosis not present

## 2017-06-22 DIAGNOSIS — M810 Age-related osteoporosis without current pathological fracture: Secondary | ICD-10-CM | POA: Insufficient documentation

## 2017-06-22 DIAGNOSIS — I4891 Unspecified atrial fibrillation: Secondary | ICD-10-CM

## 2017-06-22 DIAGNOSIS — Z87891 Personal history of nicotine dependence: Secondary | ICD-10-CM | POA: Insufficient documentation

## 2017-06-22 DIAGNOSIS — Z79899 Other long term (current) drug therapy: Secondary | ICD-10-CM | POA: Diagnosis not present

## 2017-06-22 DIAGNOSIS — F419 Anxiety disorder, unspecified: Secondary | ICD-10-CM | POA: Insufficient documentation

## 2017-06-22 DIAGNOSIS — E785 Hyperlipidemia, unspecified: Secondary | ICD-10-CM | POA: Diagnosis not present

## 2017-06-22 DIAGNOSIS — I110 Hypertensive heart disease with heart failure: Secondary | ICD-10-CM | POA: Insufficient documentation

## 2017-06-22 DIAGNOSIS — I5032 Chronic diastolic (congestive) heart failure: Secondary | ICD-10-CM

## 2017-06-22 DIAGNOSIS — I10 Essential (primary) hypertension: Secondary | ICD-10-CM

## 2017-06-22 NOTE — Patient Instructions (Signed)
Continue weighing daily and call for an overnight weight gain of > 2 pounds or a weekly weight gain of >5 pounds. 

## 2017-10-14 ENCOUNTER — Emergency Department (HOSPITAL_COMMUNITY): Payer: Medicare Other

## 2017-10-14 ENCOUNTER — Inpatient Hospital Stay (HOSPITAL_COMMUNITY)
Admission: EM | Admit: 2017-10-14 | Discharge: 2017-10-16 | DRG: 065 | Disposition: A | Discharge status: Skilled Nursing Facility | Payer: Medicare Other | Attending: Family Medicine | Admitting: Family Medicine

## 2017-10-14 DIAGNOSIS — I6300 Cerebral infarction due to thrombosis of unspecified precerebral artery: Secondary | ICD-10-CM

## 2017-10-14 DIAGNOSIS — G8190 Hemiplegia, unspecified affecting unspecified side: Secondary | ICD-10-CM | POA: Diagnosis present

## 2017-10-14 DIAGNOSIS — I639 Cerebral infarction, unspecified: Secondary | ICD-10-CM | POA: Diagnosis present

## 2017-10-14 DIAGNOSIS — H6040 Cholesteatoma of external ear, unspecified ear: Secondary | ICD-10-CM | POA: Diagnosis present

## 2017-10-14 DIAGNOSIS — R402142 Coma scale, eyes open, spontaneous, at arrival to emergency department: Secondary | ICD-10-CM | POA: Diagnosis present

## 2017-10-14 DIAGNOSIS — H9192 Unspecified hearing loss, left ear: Secondary | ICD-10-CM | POA: Diagnosis present

## 2017-10-14 DIAGNOSIS — F0391 Unspecified dementia with behavioral disturbance: Secondary | ICD-10-CM

## 2017-10-14 DIAGNOSIS — I63541 Cerebral infarction due to unspecified occlusion or stenosis of right cerebellar artery: Secondary | ICD-10-CM | POA: Diagnosis present

## 2017-10-14 DIAGNOSIS — E785 Hyperlipidemia, unspecified: Secondary | ICD-10-CM | POA: Diagnosis present

## 2017-10-14 DIAGNOSIS — Z87891 Personal history of nicotine dependence: Secondary | ICD-10-CM | POA: Diagnosis not present

## 2017-10-14 DIAGNOSIS — R471 Dysarthria and anarthria: Secondary | ICD-10-CM | POA: Diagnosis present

## 2017-10-14 DIAGNOSIS — I272 Pulmonary hypertension, unspecified: Secondary | ICD-10-CM | POA: Diagnosis present

## 2017-10-14 DIAGNOSIS — I480 Paroxysmal atrial fibrillation: Secondary | ICD-10-CM | POA: Diagnosis present

## 2017-10-14 DIAGNOSIS — N182 Chronic kidney disease, stage 2 (mild): Secondary | ICD-10-CM | POA: Diagnosis present

## 2017-10-14 DIAGNOSIS — M810 Age-related osteoporosis without current pathological fracture: Secondary | ICD-10-CM | POA: Diagnosis present

## 2017-10-14 DIAGNOSIS — J45909 Unspecified asthma, uncomplicated: Secondary | ICD-10-CM | POA: Diagnosis present

## 2017-10-14 DIAGNOSIS — I130 Hypertensive heart and chronic kidney disease with heart failure and stage 1 through stage 4 chronic kidney disease, or unspecified chronic kidney disease: Secondary | ICD-10-CM | POA: Diagnosis present

## 2017-10-14 DIAGNOSIS — Z79899 Other long term (current) drug therapy: Secondary | ICD-10-CM

## 2017-10-14 DIAGNOSIS — I4891 Unspecified atrial fibrillation: Secondary | ICD-10-CM | POA: Diagnosis not present

## 2017-10-14 DIAGNOSIS — R1310 Dysphagia, unspecified: Secondary | ICD-10-CM | POA: Diagnosis present

## 2017-10-14 DIAGNOSIS — I350 Nonrheumatic aortic (valve) stenosis: Secondary | ICD-10-CM | POA: Diagnosis present

## 2017-10-14 DIAGNOSIS — I6523 Occlusion and stenosis of bilateral carotid arteries: Secondary | ICD-10-CM | POA: Diagnosis present

## 2017-10-14 DIAGNOSIS — Z7951 Long term (current) use of inhaled steroids: Secondary | ICD-10-CM

## 2017-10-14 DIAGNOSIS — Z7901 Long term (current) use of anticoagulants: Secondary | ICD-10-CM

## 2017-10-14 DIAGNOSIS — R402252 Coma scale, best verbal response, oriented, at arrival to emergency department: Secondary | ICD-10-CM | POA: Diagnosis present

## 2017-10-14 DIAGNOSIS — M353 Polymyalgia rheumatica: Secondary | ICD-10-CM | POA: Diagnosis present

## 2017-10-14 DIAGNOSIS — R29703 NIHSS score 3: Secondary | ICD-10-CM | POA: Diagnosis present

## 2017-10-14 DIAGNOSIS — I5030 Unspecified diastolic (congestive) heart failure: Secondary | ICD-10-CM | POA: Diagnosis present

## 2017-10-14 DIAGNOSIS — F0150 Vascular dementia without behavioral disturbance: Secondary | ICD-10-CM | POA: Diagnosis present

## 2017-10-14 DIAGNOSIS — R531 Weakness: Secondary | ICD-10-CM | POA: Diagnosis present

## 2017-10-14 DIAGNOSIS — R402362 Coma scale, best motor response, obeys commands, at arrival to emergency department: Secondary | ICD-10-CM | POA: Diagnosis present

## 2017-10-14 LAB — I-STAT CHEM 8, ED
BUN: 27 mg/dL — ABNORMAL HIGH (ref 8–23)
CALCIUM ION: 1.11 mmol/L — AB (ref 1.15–1.40)
CREATININE: 1.2 mg/dL — AB (ref 0.44–1.00)
Chloride: 98 mmol/L (ref 98–111)
GLUCOSE: 99 mg/dL (ref 70–99)
HEMATOCRIT: 36 % (ref 36.0–46.0)
Hemoglobin: 12.2 g/dL (ref 12.0–15.0)
Potassium: 3.9 mmol/L (ref 3.5–5.1)
Sodium: 139 mmol/L (ref 135–145)
TCO2: 30 mmol/L (ref 22–32)

## 2017-10-14 LAB — DIFFERENTIAL
Abs Immature Granulocytes: 0 10*3/uL (ref 0.0–0.1)
Basophils Absolute: 0.1 10*3/uL (ref 0.0–0.1)
Basophils Relative: 1 %
EOS ABS: 0.2 10*3/uL (ref 0.0–0.7)
Eosinophils Relative: 4 %
Immature Granulocytes: 0 %
LYMPHS ABS: 1.1 10*3/uL (ref 0.7–4.0)
Lymphocytes Relative: 21 %
MONOS PCT: 17 %
Monocytes Absolute: 0.9 10*3/uL (ref 0.1–1.0)
Neutro Abs: 3.1 10*3/uL (ref 1.7–7.7)
Neutrophils Relative %: 57 %

## 2017-10-14 LAB — PROTIME-INR
INR: 1.11
PROTHROMBIN TIME: 14.2 s (ref 11.4–15.2)

## 2017-10-14 LAB — COMPREHENSIVE METABOLIC PANEL
ALK PHOS: 64 U/L (ref 38–126)
ALT: 19 U/L (ref 0–44)
AST: 22 U/L (ref 15–41)
Albumin: 3.2 g/dL — ABNORMAL LOW (ref 3.5–5.0)
Anion gap: 6 (ref 5–15)
BILIRUBIN TOTAL: 1 mg/dL (ref 0.3–1.2)
BUN: 24 mg/dL — AB (ref 8–23)
CALCIUM: 8.9 mg/dL (ref 8.9–10.3)
CO2: 31 mmol/L (ref 22–32)
CREATININE: 1.19 mg/dL — AB (ref 0.44–1.00)
Chloride: 103 mmol/L (ref 98–111)
GFR calc Af Amer: 45 mL/min — ABNORMAL LOW (ref >60)
GFR, EST NON AFRICAN AMERICAN: 39 mL/min — AB (ref >60)
Glucose, Bld: 103 mg/dL — ABNORMAL HIGH (ref 70–99)
POTASSIUM: 4 mmol/L (ref 3.5–5.1)
Sodium: 140 mmol/L (ref 135–145)
TOTAL PROTEIN: 6.3 g/dL — AB (ref 6.5–8.1)

## 2017-10-14 LAB — CBC
HCT: 38.1 % (ref 36.0–46.0)
Hemoglobin: 11.5 g/dL — ABNORMAL LOW (ref 12.0–15.0)
MCH: 29.1 pg (ref 26.0–34.0)
MCHC: 30.2 g/dL (ref 30.0–36.0)
MCV: 96.5 fL (ref 78.0–100.0)
PLATELETS: 168 10*3/uL (ref 150–400)
RBC: 3.95 MIL/uL (ref 3.87–5.11)
RDW: 14.1 % (ref 11.5–15.5)
WBC: 5.5 10*3/uL (ref 4.0–10.5)

## 2017-10-14 LAB — APTT: aPTT: 31 seconds (ref 24–36)

## 2017-10-14 LAB — I-STAT TROPONIN, ED: Troponin i, poc: 0.01 ng/mL (ref 0.00–0.08)

## 2017-10-14 MED ORDER — KETOROLAC TROMETHAMINE 15 MG/ML IJ SOLN: 15.0000 mg | Freq: Four times a day (QID) | INTRAMUSCULAR | Status: DC | PRN

## 2017-10-14 MED ORDER — SIMVASTATIN 20 MG PO TABS: 20.0000 mg | ORAL_TABLET | Freq: Every day | ORAL | Status: DC

## 2017-10-14 MED ORDER — ACETAMINOPHEN 650 MG RE SUPP: 650.0000 mg | Freq: Four times a day (QID) | RECTAL | Status: DC | PRN

## 2017-10-14 MED ORDER — FUROSEMIDE 40 MG PO TABS
40.0000 mg | ORAL_TABLET | Freq: Every day | ORAL | Status: DC
  Administered 2017-10-15 – 2017-10-16 (×2): 40 mg via ORAL
  Filled 2017-10-14 (×2): qty 1

## 2017-10-14 MED ORDER — FLUTICASONE PROPIONATE 50 MCG/ACT NA SUSP
2.0000 | Freq: Every day | NASAL | Status: DC
  Filled 2017-10-14: qty 16

## 2017-10-14 MED ORDER — POTASSIUM CHLORIDE CRYS ER 10 MEQ PO TBCR
10.0000 meq | EXTENDED_RELEASE_TABLET | Freq: Once | ORAL | Status: DC
  Filled 2017-10-14: qty 1

## 2017-10-14 MED ORDER — SODIUM CHLORIDE 0.9 % IV SOLN
INTRAVENOUS | Status: DC
  Administered 2017-10-14: via INTRAVENOUS

## 2017-10-14 MED ORDER — PRO-STAT SUGAR FREE PO LIQD
30.0000 mL | Freq: Two times a day (BID) | ORAL | Status: DC
  Administered 2017-10-15 – 2017-10-16 (×2): 30 mL via ORAL
  Filled 2017-10-14 (×2): qty 30

## 2017-10-14 MED ORDER — ENOXAPARIN SODIUM 30 MG/0.3ML ~~LOC~~ SOLN: 30.0000 mg | SUBCUTANEOUS | Status: DC

## 2017-10-14 MED ORDER — BISACODYL 5 MG PO TBEC: 5.0000 mg | DELAYED_RELEASE_TABLET | Freq: Every day | ORAL | Status: DC | PRN

## 2017-10-14 MED ORDER — APIXABAN 2.5 MG PO TABS
2.5000 mg | ORAL_TABLET | Freq: Two times a day (BID) | ORAL | Status: DC
  Administered 2017-10-15 – 2017-10-16 (×3): 2.5 mg via ORAL
  Filled 2017-10-14 (×4): qty 1

## 2017-10-14 MED ORDER — ZOLPIDEM TARTRATE 5 MG PO TABS: 5.0000 mg | ORAL_TABLET | Freq: Every evening | ORAL | Status: DC | PRN

## 2017-10-14 MED ORDER — AMIODARONE HCL 200 MG PO TABS
200.0000 mg | ORAL_TABLET | Freq: Every day | ORAL | Status: DC
  Administered 2017-10-15 – 2017-10-16 (×2): 200 mg via ORAL
  Filled 2017-10-14 (×2): qty 1

## 2017-10-14 MED ORDER — DONEPEZIL HCL 10 MG PO TABS
10.0000 mg | ORAL_TABLET | Freq: Every day | ORAL | Status: DC
  Administered 2017-10-15: 10 mg via ORAL
  Filled 2017-10-14 (×2): qty 1

## 2017-10-14 MED ORDER — ALBUTEROL SULFATE (2.5 MG/3ML) 0.083% IN NEBU: 3.0000 mL | INHALATION_SOLUTION | Freq: Four times a day (QID) | RESPIRATORY_TRACT | Status: DC | PRN

## 2017-10-14 MED ORDER — MOMETASONE FURO-FORMOTEROL FUM 200-5 MCG/ACT IN AERO
2.0000 | INHALATION_SPRAY | Freq: Two times a day (BID) | RESPIRATORY_TRACT | Status: DC
  Administered 2017-10-14 – 2017-10-15 (×2): 2 via RESPIRATORY_TRACT
  Filled 2017-10-14: qty 8.8

## 2017-10-14 MED ORDER — ACETAMINOPHEN 325 MG PO TABS: 650.0000 mg | ORAL_TABLET | Freq: Four times a day (QID) | ORAL | Status: DC | PRN

## 2017-10-14 MED ORDER — OXYCODONE HCL 5 MG PO TABS: 5.0000 mg | ORAL_TABLET | ORAL | Status: DC | PRN

## 2017-10-14 MED ORDER — CARBOXYMETHYLCELLULOSE SODIUM 0.5 % OP SOLN: 1.0000 [drp] | Freq: Two times a day (BID) | OPHTHALMIC | Status: DC

## 2017-10-14 MED ORDER — HYPROMELLOSE (GONIOSCOPIC) 2.5 % OP SOLN
1.0000 [drp] | Freq: Two times a day (BID) | OPHTHALMIC | Status: DC | PRN
  Filled 2017-10-14: qty 15

## 2017-10-14 NOTE — ED Notes (Signed)
Pt in room and alert VS documented Call light within reach Family currently NOT present

## 2017-10-14 NOTE — ED Notes (Signed)
Pt remains in MRI- MRI called to take patient to [facility]

## 2017-10-14 NOTE — ED Provider Notes (Signed)
[facility] Provider Note   CSN: [id number] Arrival date & time: 10/14/17  8242     History   Chief Complaint Chief Complaint  Patient presents with  . Code Stroke    HPI PERNELLA White is a 84 y.o. female.  The history is provided by the patient. No language interpreter was used.  Neurologic Problem  This is a new problem. The current episode started less than 1 hour ago. The problem occurs constantly. The problem has not changed since onset.Pertinent negatives include no chest pain, no abdominal pain, no headaches and no shortness of breath. Nothing aggravates the symptoms. Nothing relieves the symptoms. She has tried nothing for the symptoms. The treatment provided no relief.    Past Medical History:  Diagnosis Date  . Allergic rhinitis   . Anxiety    unspecified  . Back pain    unspecified  . Cystitis   . Edema   . Gait abnormality   . Hand pain   . Hearing loss    left ear  . Hematuria   . History of atrial fibrillation   . Hyperlipidemia, unspecified   . Hypertension   . Osteoporosis   . Osteoporosis, post-menopausal   . PAF (paroxysmal atrial fibrillation) ([place])   . Polymyalgia ([place])   . Pulmonary hypertension ([place])   . Skin cancer     Patient Active Problem List   Diagnosis Date Noted  . Protein-calorie malnutrition ([place]) 04/28/2017  . Vascular dementia 03/26/2017  . CHF (congestive heart failure) ([place]) 03/02/2017  . Chronic diastolic heart failure ([place]) 02/13/2017  . HTN (hypertension) 02/13/2017  . Atrial fibrillation with RVR ([place]) 02/02/2017    Past Surgical History:  Procedure Laterality Date  . BREAST BIOPSY    . INGUINAL HERNIA REPAIR Right 2005  . KYPHOPLASTY N/A 04/17/2017   Procedure: [id number];  Surgeon: Hessie Knows, MD;  Location: [facility] ORS;  Service: Orthopedics;  Laterality: N/A;     OB History   None      Home Medications    Prior to Admission medications   Medication  Sig Start Date End Date Taking? Authorizing Provider  acetaminophen (TYLENOL) 325 MG tablet Take 650 mg by mouth every 4 (four) hours as needed.    [provider]  albuterol (PROVENTIL HFA;VENTOLIN HFA) 108 (90 Base) MCG/ACT inhaler Inhale 2 puffs into the lungs every 6 (six) hours as needed for wheezing or shortness of breath.    [provider]  albuterol (PROVENTIL) (2.5 MG/3ML) 0.083% nebulizer solution Take 2.5 mg by nebulization every 6 (six) hours as needed for wheezing or shortness of breath.    [provider]  Amino Acids-Protein Hydrolys (FEEDING SUPPLEMENT, PRO-STAT SUGAR FREE 64,) LIQD Take 30 mLs by mouth 2 (two) times daily between meals.     [provider]  amiodarone (PACERONE) 200 MG tablet Take 1 tablet (200 mg total) by mouth daily. 03/07/17   Salary, Avel Peace, MD  apixaban (ELIQUIS) 2.5 MG TABS tablet Take 1 tablet (2.5 mg total) by mouth 2 (two) times daily. 02/07/17   Henreitta Leber, MD  azelastine (ASTELIN) 0.1 % nasal spray Place 2 sprays into both nostrils 2 (two) times daily. Use in each nostril as directed    [provider]  budesonide-formoterol (SYMBICORT) 160-4.5 MCG/ACT inhaler Inhale 2 puffs into the lungs 2 (two) times daily. 03/06/17   Salary, Avel Peace, MD  donepezil (ARICEPT) 10 MG tablet Take 10 mg by mouth at bedtime.  [provider]  fluticasone (FLONASE) 50 MCG/ACT nasal spray Place 2 sprays into the nose daily.  12/23/16 12/23/17  [provider]  furosemide (LASIX) 40 MG tablet Take 1 tablet (40 mg total) by mouth daily. 03/07/17   Salary, Avel Peace, MD  lactose free nutrition (BOOST) LIQD Take 237 mLs by mouth 2 (two) times daily between meals.    [provider]  metoprolol tartrate (LOPRESSOR) 25 MG tablet Take 0.5 tablets (12.5 mg total) by mouth 2 (two) times daily. 03/06/17   Salary, Avel Peace, MD  Nutritional Supplements (ENSURE ENLIVE PO) Take 1 Bottle by mouth 2 (two) times  daily between meals.     [provider]  oxyCODONE-acetaminophen (PERCOCET/ROXICET) 5-325 MG tablet Take 1-2 tablets by mouth every 6 (six) hours as needed for severe pain. Patient taking differently: Take 1-2 tablets by mouth every 6 (six) hours as needed for severe pain. 2 tab q6h prn severe pain 04/13/17   [facility] Polio, MD  potassium chloride (MICRO-K) 10 MEQ CR capsule Take 10 mEq by mouth daily.    [provider]  simvastatin (ZOCOR) 20 MG tablet Take 20 mg by mouth daily.    [provider]    Family History Family History  Problem Relation Age of Onset  . Heart failure Sister   . Cancer Sister   . Aneurysm Mother   . Colon cancer Father   . Heart attack Brother   . Cancer Sister     Social History Social History   Tobacco Use  . Smoking status: Former Smoker    Packs/day: 1.00    Years: 25.00    Pack years: 25.00    Types: Cigarettes    Last attempt to quit: 02/24/1978    Years since quitting: 39.6  . Smokeless tobacco: Never Used  Substance Use Topics  . Alcohol use: No  . Drug use: No     Allergies   Patient has no known allergies.   Review of Systems Review of Systems  Constitutional: Negative for chills, diaphoresis and fatigue.  HENT: Negative for congestion.   Respiratory: Negative for cough, chest tightness and shortness of breath.   Cardiovascular: Negative for chest pain.  Gastrointestinal: Negative for abdominal pain, constipation, diarrhea, nausea and vomiting.  Genitourinary: Negative for dysuria.  Musculoskeletal: Negative for back pain.  Neurological: Positive for speech difficulty and weakness. Negative for dizziness, facial asymmetry, light-headedness and headaches.  Psychiatric/Behavioral: Negative for agitation.  All other systems reviewed and are negative.    Physical Exam Updated Vital Signs BP (!) 154/97 (BP Location: Right Arm)   Pulse (!) 58   Temp 98.2 F (36.8 C)   Resp 14   SpO2 99%    Physical Exam  Constitutional: She appears well-developed and well-nourished. No distress.  HENT:  Head: Normocephalic and atraumatic.  Mouth/Throat: Oropharynx is clear and moist. No oropharyngeal exudate.  Eyes: Pupils are equal, round, and reactive to light. Conjunctivae and EOM are normal.  Neck: Normal range of motion. Neck supple.  Cardiovascular: Normal rate and regular rhythm.  Murmur heard. Pulmonary/Chest: Effort normal and breath sounds normal. No respiratory distress. She has no wheezes. She exhibits no tenderness.  Abdominal: Soft. There is no tenderness.  Musculoskeletal: She exhibits no edema or tenderness.  Neurological: She is alert. No cranial nerve deficit or sensory deficit. She exhibits abnormal muscle tone. GCS eye subscore is 4. GCS verbal subscore is 5. GCS motor subscore is 6.  Patient had right arm weakness  and dysarthria on my exam.  Skin: Skin is warm and dry. Capillary refill takes less than 2 seconds. No rash noted. She is not diaphoretic. No erythema.  Psychiatric: She has a normal mood and affect.  Nursing note and vitals reviewed.    ED Treatments / Results  Labs (all labs ordered are listed, but only abnormal results are displayed) Labs Reviewed  CBC - Abnormal; Notable for the following components:      Result Value   Hemoglobin 11.5 (*)    All other components within normal limits  COMPREHENSIVE METABOLIC PANEL - Abnormal; Notable for the following components:   Glucose, Bld 103 (*)    BUN 24 (*)    Creatinine, Ser 1.19 (*)    Total Protein 6.3 (*)    Albumin 3.2 (*)    GFR calc non Af Amer 39 (*)    GFR calc Af Amer 45 (*)    All other components within normal limits  I-STAT CHEM 8, ED - Abnormal; Notable for the following components:   BUN 27 (*)    Creatinine, Ser 1.20 (*)    Calcium, Ion 1.11 (*)    All other components within normal limits  PROTIME-INR  APTT  DIFFERENTIAL  CREATININE, SERUM  I-STAT TROPONIN, ED  CBG  MONITORING, ED    EKG EKG Interpretation  Date/Time:  Wednesday October 14 2017 09:52:43 EDT Ventricular Rate:  58 PR Interval:    QRS Duration: 99 QT Interval:  478 QTC Calculation: 470 R Axis:   68 Text Interpretation:  Sinus rhythm RSR' in V1 or V2, probably normal variant Borderline repolarization abnormality When compared to prior, no significant changes seen.  No STEMI Confirmed by Antony Blackbird [phone]) on 10/14/2017 9:58:24 AM   Radiology Mr Jodene Nam Head Wo Contrast  Addendum Date: 10/14/2017   ADDENDUM REPORT: 10/14/2017 14:16 ADDENDUM: Restricted diffusion within soft tissue of the left external auditory canal is highly concerning for an EAC cholesteatoma. Electronically Signed   By: San Morelle M.D.   On: 10/14/2017 14:16   Result Date: 10/14/2017 CLINICAL DATA:  Slurred speech. Right-sided weakness. Symptoms began 4 hours ago. Leaning to the right while walking. EXAM: MRI HEAD WITHOUT CONTRAST MRA HEAD WITHOUT CONTRAST TECHNIQUE: Multiplanar, multiecho pulse sequences of the brain and surrounding structures were obtained without intravenous contrast. Angiographic images of the head were obtained using MRA technique without contrast. COMPARISON:  CT head without contrast 10/14/2017. FINDINGS: MRI HEAD FINDINGS Brain: Acute nonhemorrhagic infarct is confirmed within the right inferior temporal gyrus. Infarct measures up to 1.7 cm in maximal dimension. A subacute infarct in the anterior left pons appears slightly older. A punctate infarct in the superior right cerebellum is acute. A punctate posterior right frontal lobe infarct is present on image 36 of series 3. This appears acute. Diffuse white matter changes are present. Focal T2 signal changes are associated with the areas of restricted diffusion mentioned above. More remote right cerebellar infarcts are present as well. Dilated perivascular spaces are present throughout the basal ganglia. The ventricles are proportionate to the  degree of atrophy. No significant extra-axial fluid collection is present. Vascular: Flow is present in the major intracranial arteries. Skull and upper cervical spine: The skull base is within normal limits. Craniocervical junction is normal. Sinuses/Orbits: The paranasal sinuses and mastoid air cells are clear. Bilateral lens replacements are present. Globes and orbits are otherwise within normal limits. MRA HEAD FINDINGS The internal carotid arteries are within normal limits from the high cervical segments  through the ICA termini bilaterally. The left A1 segment is dominant. The anterior communicating artery is patent. MCA bifurcations are normal. ACA and MCA branch vessels are within normal limits. The left vertebral artery is dominant. PICA origins are visualized and normal. The left posterior cerebral artery originates from a normal basilar tip. The right posterior cerebral artery is of fetal type. A small right P1 segment and infundibulum is present. There is no aneurysm, significant proximal stenosis, or branch vessel occlusion. IMPRESSION: 1. Acute nonhemorrhagic infarcts involving the right inferior temporal gyrus and right superior cerebellum. 2. Acute/subacute nonhemorrhagic infarct involving the anterior left pons, likely major cortical spinal tracts. 3. Diffuse white matter disease likely reflects the sequela of chronic microvascular ischemia. 4. Normal variant MRA circle-of-Willis without significant proximal stenosis, aneurysm, or branch vessel occlusion. Electronically Signed: By: San Morelle M.D. On: 10/14/2017 13:37   Mr Brain Wo Contrast  Addendum Date: 10/14/2017   ADDENDUM REPORT: 10/14/2017 14:16 ADDENDUM: Restricted diffusion within soft tissue of the left external auditory canal is highly concerning for an EAC cholesteatoma. Electronically Signed   By: San Morelle M.D.   On: 10/14/2017 14:16   Result Date: 10/14/2017 CLINICAL DATA:  Slurred speech. Right-sided  weakness. Symptoms began 4 hours ago. Leaning to the right while walking. EXAM: MRI HEAD WITHOUT CONTRAST MRA HEAD WITHOUT CONTRAST TECHNIQUE: Multiplanar, multiecho pulse sequences of the brain and surrounding structures were obtained without intravenous contrast. Angiographic images of the head were obtained using MRA technique without contrast. COMPARISON:  CT head without contrast 10/14/2017. FINDINGS: MRI HEAD FINDINGS Brain: Acute nonhemorrhagic infarct is confirmed within the right inferior temporal gyrus. Infarct measures up to 1.7 cm in maximal dimension. A subacute infarct in the anterior left pons appears slightly older. A punctate infarct in the superior right cerebellum is acute. A punctate posterior right frontal lobe infarct is present on image 36 of series 3. This appears acute. Diffuse white matter changes are present. Focal T2 signal changes are associated with the areas of restricted diffusion mentioned above. More remote right cerebellar infarcts are present as well. Dilated perivascular spaces are present throughout the basal ganglia. The ventricles are proportionate to the degree of atrophy. No significant extra-axial fluid collection is present. Vascular: Flow is present in the major intracranial arteries. Skull and upper cervical spine: The skull base is within normal limits. Craniocervical junction is normal. Sinuses/Orbits: The paranasal sinuses and mastoid air cells are clear. Bilateral lens replacements are present. Globes and orbits are otherwise within normal limits. MRA HEAD FINDINGS The internal carotid arteries are within normal limits from the high cervical segments through the ICA termini bilaterally. The left A1 segment is dominant. The anterior communicating artery is patent. MCA bifurcations are normal. ACA and MCA branch vessels are within normal limits. The left vertebral artery is dominant. PICA origins are visualized and normal. The left posterior cerebral artery originates  from a normal basilar tip. The right posterior cerebral artery is of fetal type. A small right P1 segment and infundibulum is present. There is no aneurysm, significant proximal stenosis, or branch vessel occlusion. IMPRESSION: 1. Acute nonhemorrhagic infarcts involving the right inferior temporal gyrus and right superior cerebellum. 2. Acute/subacute nonhemorrhagic infarct involving the anterior left pons, likely major cortical spinal tracts. 3. Diffuse white matter disease likely reflects the sequela of chronic microvascular ischemia. 4. Normal variant MRA circle-of-Willis without significant proximal stenosis, aneurysm, or branch vessel occlusion. Electronically Signed: By: San Morelle M.D. On: 10/14/2017 13:37   Ct Head  Code Stroke Wo Contrast  Result Date: 10/14/2017 CLINICAL DATA:  Code stroke. Sudden onset of slurred speech with right-sided weakness EXAM: CT HEAD WITHOUT CONTRAST TECHNIQUE: Contiguous axial images were obtained from the base of the skull through the vertex without intravenous contrast. COMPARISON:  08/19/2013 brain MRI FINDINGS: Brain: Cytotoxic edema appearance without volume loss in the superficial and inferior right temporal lobe, MCA inferior division. No visible left-sided infarct. The patient is reportedly not a tPA candidate due to Eliquis use. Negative for acute hemorrhage. There is chronic small vessel ischemia in the cerebral white matter. Vascular: Atherosclerotic calcification.  No hyperdense vessel Skull: No acute finding.  Remote left orbital rim repair Sinuses/Orbits: No acute finding. Soft tissue density obliterates the left external auditory canal and causes inward bulging of the tympanic membrane, with deficiency of left ossicles. The soft tissue is new from 2007 neck CT, although the ossicular deficiency is likely chronic from that time. Need visual inspection to differentiate cerumen from cholesteatoma. No canal erosion. Other: These results were called by  telephone at the time of interpretation on 10/14/2017 at 9:48 am to Dr. Cheral Marker, who verbally acknowledged these results. ASPECTS [facility] Stroke Program Early CT Score) Not scored given the above. IMPRESSION: 1. Small recent appearing infarct in the superficial and inferior right temporal lobe. 2. No left-sided infarct is seen in this patient with right-sided weakness. 3. Soft tissue fills the left external auditory canal and there is left ossicular deficiency. Need visual inspection to differentiate cerumen from cholesteatoma. Electronically Signed   By: Monte Fantasia M.D.   On: 10/14/2017 09:53    Procedures Procedures (including critical care time)  Medications Ordered in ED Medications  0.9 %  sodium chloride infusion (has no administration in time range)  acetaminophen (TYLENOL) tablet 650 mg (has no administration in time range)    Or  acetaminophen (TYLENOL) suppository 650 mg (has no administration in time range)  oxyCODONE (Oxy IR/ROXICODONE) immediate release tablet 5 mg (has no administration in time range)  zolpidem (AMBIEN) tablet 5 mg (has no administration in time range)  ketorolac (TORADOL) 15 MG/ML injection 15 mg (has no administration in time range)  bisacodyl (DULCOLAX) EC tablet 5 mg (has no administration in time range)  albuterol (PROVENTIL) (2.5 MG/3ML) 0.083% nebulizer solution 3 mL (has no administration in time range)  feeding supplement (PRO-STAT SUGAR FREE 64) liquid 30 mL (has no administration in time range)  amiodarone (PACERONE) tablet 200 mg (has no administration in time range)  mometasone-formoterol (DULERA) 200-5 MCG/ACT inhaler 2 puff (2 puffs Inhalation Not Given 10/14/17 1302)  carboxymethylcellulose (REFRESH PLUS) 0.5 % ophthalmic solution 1 drop (has no administration in time range)  donepezil (ARICEPT) tablet 10 mg (has no administration in time range)  furosemide (LASIX) tablet 40 mg (has no administration in time range)  fluticasone (FLONASE) 50  MCG/ACT nasal spray 2 spray (has no administration in time range)  potassium chloride (K-DUR,KLOR-CON) CR tablet 10 mEq (has no administration in time range)  apixaban (ELIQUIS) tablet 2.5 mg (has no administration in time range)     Initial Impression / Assessment and Plan / ED Course  I have reviewed the triage vital signs and the nursing notes.  Pertinent labs & imaging results that were available during my care of the patient were reviewed by me and considered in my medical decision making (see chart for details).     KEVONNA NOLTE is a 84 y.o. female with a past medical history significant for paroxysmal H  for ablation on Eliquis, CHF, hypertension, and vascular dementia who presents as a code stroke.  Patient was last normal 8:30 AM when she was having coffee and talking with someone.  Therefore that she immediately started slurring her speech today 30 a.m.  She then tried to walk and was leaning to the right trying her right foot by EMS report.  Patient otherwise was acting normal last several days and this morning when waking up.  They report no recent traumas.  Patient denies any headaches.  Patient quickly taken to CT scanner for code stroke imaging and neurology was at the bedside before my arrival.  On my initial exam, patient did have some mild dysarthria.  Patient's right grip strength was decreased compared to left.  Patient had mild pronator drift on right.  Patient also had 4-5 strength in her right leg compared to left.  Patient was otherwise alert and oriented.  Lungs clear chest nontender.  Abdomen nontender.  CT scan showed concern for possible right sided stroke which does not correlate with the patient's symptoms.  Patient will be admitted for MRI and further management according to neurology recommendations.  Neurology feels she is not a candidate for intervention due to her anti-coagulation status.  Hospitalist team called for admission and patient will be  admitted.   Final Clinical Impressions(s) / ED Diagnoses   Final diagnoses:  Acute ischemic stroke (HCC)    Clinical Impression: 1. Acute ischemic stroke [facility])     Disposition: Admit  This note was prepared with assistance of Dragon voice recognition software. Occasional wrong-word or sound-a-like substitutions may have occurred due to the inherent limitations of voice recognition software.      Tegeler, Gwenyth Allegra, MD 10/14/17 [phone]

## 2017-10-14 NOTE — Code Documentation (Signed)
84 year old female presents via [facility] as code stroke.  Patient lives in [place].  Staff reports she was up at 0530 this AM walking around in her usual state of health.  At 0830 she was drinking coffee with the staff and had sudden onset of slurred speech and right side weakness.  The staff also report difficult gait at that time.  Code stroke was called in the field. Patient has history of afib and is on Eliquis.   On arrival she is alert - warm and dry - follows simple commands - speech is dysarthric - some mild fluency of speech at times - she is [facility] which seems to influence her responses.  She is able to name objects.  She was met at the bridge by the stroke team - cleared by EDP for CT scan.  In the CT scan her speech became more dysarthric with intermittent clearing.  [facility] 4.  Difficult IV stick - unable to get blood at present for labs.  Dr. Cheral Marker is at the bedside.  tPA contraindicated due to blood thinner.  Not a candidate for IR per Dr. Cheral Marker. No acute stroke treatment.  Handoff to [employer].

## 2017-10-14 NOTE — ED Notes (Signed)
PT placed on 2L [place].

## 2017-10-14 NOTE — ED Notes (Signed)
Attempted blood draw x2. Rn attempted to pull from IV. Unsuccessful with all 3 attempts. Delay noted.

## 2017-10-14 NOTE — ED Notes (Addendum)
Pt transported to MRI 

## 2017-10-14 NOTE — H&P (Signed)
History and Physical    Krystal White HWE:[id number] DOB: [birth year] DOA: 10/14/2017  PCP: Derinda Late, MD Patient coming from:  [facility]  Chief Complaint: Stroke  HPI: Krystal White is a 84 y.o. female with medical history significant of Atrial fibrillation, CHF, Hypertension and dementia who developed sudden onset of slurred speech and weakness at 8:30am,  Pt tried to walk and had leaning to the right. Pt is reported to have been at her baseline this am.     ED Course: Pt was seen in the ED and code stroke was activated.  Pt was seen by Dr. Cheral Marker who advised further cva evaluation.  Pt is not a candidate for tpa   Review of Systems: As per HPI otherwise all other systems reviewed and are negative  Review of Systems  Constitutional: Negative.   HENT: Negative.   Eyes: Negative.   Respiratory: Negative for shortness of breath.   Cardiovascular: Negative for chest pain.  Gastrointestinal: Negative for abdominal pain.  Musculoskeletal: Negative for myalgias.  Skin: Negative.   Neurological: Negative for dizziness.  Psychiatric/Behavioral: Negative.   All other systems reviewed and are negative.  Ambulatory Status:normally ambulates   Past Medical History:  Diagnosis Date  . Allergic rhinitis   . Anxiety    unspecified  . Back pain    unspecified  . Cystitis   . Edema   . Gait abnormality   . Hand pain   . Hearing loss    left ear  . Hematuria   . History of atrial fibrillation   . Hyperlipidemia, unspecified   . Hypertension   . Osteoporosis   . Osteoporosis, post-menopausal   . PAF (paroxysmal atrial fibrillation) ([place])   . Polymyalgia ([place])   . Pulmonary hypertension ([place])   . Skin cancer     Past Surgical History:  Procedure Laterality Date  . BREAST BIOPSY    . INGUINAL HERNIA REPAIR Right 2005  . KYPHOPLASTY N/A 04/17/2017   Procedure: [id number];  Surgeon: Hessie Knows, MD;  Location: [facility] ORS;  Service:  Orthopedics;  Laterality: N/A;    Social History   Socioeconomic History  . Marital status: Widowed    Spouse name: Not on file  . Number of children: 1  . Years of education: 50  . Highest education level: High school graduate  Occupational History  . Not on file  Social Needs  . Financial resource strain: Not hard at all  . Food insecurity:    Worry: Patient refused    Inability: Patient refused  . Transportation needs:    Medical: Yes    Non-medical: Yes  Tobacco Use  . Smoking status: Former Smoker    Packs/day: 1.00    Years: 25.00    Pack years: 25.00    Types: Cigarettes    Last attempt to quit: 02/24/1978    Years since quitting: 39.6  . Smokeless tobacco: Never Used  Substance and Sexual Activity  . Alcohol use: No  . Drug use: No  . Sexual activity: Never  Lifestyle  . Physical activity:    Days per week: 4 days    Minutes per session: 10 min  . Stress: Rather much  Relationships  . Social connections:    Talks on phone: More than three times a week    Gets together: Three times a week    Attends religious service: More than 4 times per year    Active member of club or organization: No  Attends meetings of clubs or organizations: Never    Relationship status: Widowed  . Intimate partner violence:    Fear of current or ex partner: No    Emotionally abused: No    Physically abused: No    Forced sexual activity: No  Other Topics Concern  . Not on file  Social History Narrative   Former smoker   No alcohol use   Widowed   1 child   DNR    No Known Allergies  Family History  Problem Relation Age of Onset  . Heart failure Sister   . Cancer Sister   . Aneurysm Mother   . Colon cancer Father   . Heart attack Brother   . Cancer Sister     Prior to Admission medications   Medication Sig Start Date End Date Taking? Authorizing Provider  albuterol (PROVENTIL HFA;VENTOLIN HFA) 108 (90 Base) MCG/ACT inhaler Inhale 2 puffs into the lungs every 6  (six) hours as needed for wheezing or shortness of breath.   Yes [provider]  Amino Acids-Protein Hydrolys (FEEDING SUPPLEMENT, PRO-STAT SUGAR FREE 64,) LIQD Take 30 mLs by mouth 2 (two) times daily between meals.    Yes [provider]  amiodarone (PACERONE) 200 MG tablet Take 1 tablet (200 mg total) by mouth daily. 03/07/17  Yes Salary, Avel Peace, MD  apixaban (ELIQUIS) 2.5 MG TABS tablet Take 1 tablet (2.5 mg total) by mouth 2 (two) times daily. 02/07/17  Yes Sainani, Belia Heman, MD  azelastine (ASTELIN) 0.1 % nasal spray Place 2 sprays into both nostrils 2 (two) times daily. Use in each nostril as directed   Yes [provider]  budesonide-formoterol (SYMBICORT) 160-4.5 MCG/ACT inhaler Inhale 2 puffs into the lungs 2 (two) times daily. 03/06/17  Yes Salary, Avel Peace, MD  carboxymethylcellulose (REFRESH PLUS) 0.5 % SOLN Place 1 drop into both eyes 2 (two) times daily.   Yes [provider]  donepezil (ARICEPT) 10 MG tablet Take 10 mg by mouth at bedtime.   Yes [provider]  fluticasone (FLONASE) 50 MCG/ACT nasal spray Place 2 sprays into the nose daily.  12/23/16 12/23/17 Yes [provider]  furosemide (LASIX) 40 MG tablet Take 1 tablet (40 mg total) by mouth daily. 03/07/17  Yes Salary, Avel Peace, MD  metoprolol tartrate (LOPRESSOR) 25 MG tablet Take 0.5 tablets (12.5 mg total) by mouth 2 (two) times daily. Patient taking differently: Take 25 mg by mouth 2 (two) times daily.  03/06/17  Yes Salary, Holly Bodily D, MD  potassium chloride (MICRO-K) 10 MEQ CR capsule Take 10 mEq by mouth daily.   Yes [provider]  simvastatin (ZOCOR) 20 MG tablet Take 20 mg by mouth daily.   Yes [provider]    Physical Exam: Vitals:   10/14/17 1056 10/14/17 1100 10/14/17 1115 10/14/17 1130  BP:  (!) 181/48 (!) 163/50 102/83  Pulse:  (!) 57 (!) 55 (!) 59  Resp:  16 (!) 21 18  Temp: 98.2 F (36.8 C)     TempSrc:      SpO2:  100% 100%  98%     General:  Appears calm and comfortable Eyes:  PERRL, EOMI, normal lids, iris ENT:  grossly normal hearing, lips & tongue, mmm Neck:  no LAD, masses or thyromegaly Cardiovascular:  RRR, no m/r/g. No LE edema.  Respiratory:  CTA bilaterally, no w/r/r. Normal respiratory effort. Abdomen:  soft, ntnd, NABS Skin:  no rash or induration seen on limited exam Musculoskeletal:  grossly normal tone BUE/BLE, good ROM, no bony abnormality Psychiatric:  grossly normal mood and affect, speech fluent and appropriate,  Neurologic:  CN 2-12 grossly intact, decreased range of motion left arm   Labs on Admission: I have personally reviewed following labs and imaging studies  CBC: Recent Labs  Lab 10/14/17 1031 10/14/17 1041  WBC 5.5  --   NEUTROABS 3.1  --   HGB 11.5* 12.2  HCT 38.1 36.0  MCV 96.5  --   PLT 168  --    Basic Metabolic Panel: Recent Labs  Lab 10/14/17 1031 10/14/17 1041  NA 140 139  K 4.0 3.9  CL 103 98  CO2 31  --   GLUCOSE 103* 99  BUN 24* 27*  CREATININE 1.19* 1.20*  CALCIUM 8.9  --    GFR: CrCl cannot be calculated (Unknown ideal weight.). Liver Function Tests: Recent Labs  Lab 10/14/17 1031  AST 22  ALT 19  ALKPHOS 64  BILITOT 1.0  PROT 6.3*  ALBUMIN 3.2*   No results for input(s): LIPASE, AMYLASE in the last 168 hours. No results for input(s): AMMONIA in the last 168 hours. Coagulation Profile: Recent Labs  Lab 10/14/17 1031  INR 1.11   Cardiac Enzymes: No results for input(s): CKTOTAL, CKMB, CKMBINDEX, TROPONINI in the last 168 hours. BNP (last 3 results) No results for input(s): PROBNP in the last 8760 hours. HbA1C: No results for input(s): HGBA1C in the last 72 hours. CBG: No results for input(s): GLUCAP in the last 168 hours. Lipid Profile: No results for input(s): CHOL, HDL, LDLCALC, TRIG, CHOLHDL, LDLDIRECT in the last 72 hours. Thyroid Function Tests: No results for input(s): TSH, T4TOTAL, FREET4, T3FREE, THYROIDAB in the  last 72 hours. Anemia Panel: No results for input(s): VITAMINB12, FOLATE, FERRITIN, TIBC, IRON, RETICCTPCT in the last 72 hours. Urine analysis:    Component Value Date/Time   COLORURINE YELLOW (A) 04/11/2017 1623   APPEARANCEUR CLEAR (A) 04/11/2017 1623   APPEARANCEUR Hazy 06/23/2013 1233   LABSPEC 1.016 04/11/2017 1623   LABSPEC 1.021 06/23/2013 1233   PHURINE 5.0 04/11/2017 1623   GLUCOSEU NEGATIVE 04/11/2017 1623   GLUCOSEU Negative 06/23/2013 1233   HGBUR NEGATIVE 04/11/2017 1623   BILIRUBINUR NEGATIVE 04/11/2017 1623   BILIRUBINUR Negative 06/23/2013 1233   KETONESUR NEGATIVE 04/11/2017 1623   PROTEINUR NEGATIVE 04/11/2017 1623   NITRITE NEGATIVE 04/11/2017 1623   LEUKOCYTESUR NEGATIVE 04/11/2017 1623   LEUKOCYTESUR 2+ 06/23/2013 1233    Creatinine Clearance: CrCl cannot be calculated (Unknown ideal weight.).  Sepsis Labs: @LABRCNTIP (procalcitonin:4,lacticidven:4) )No results found for this or any previous visit (from the past 240 hour(s)).   Radiological Exams on Admission: Ct Head Code Stroke Wo Contrast  Result Date: 10/14/2017 CLINICAL DATA:  Code stroke. Sudden onset of slurred speech with right-sided weakness EXAM: CT HEAD WITHOUT CONTRAST TECHNIQUE: Contiguous axial images were obtained from the base of the skull through the vertex without intravenous contrast. COMPARISON:  08/19/2013 brain MRI FINDINGS: Brain: Cytotoxic edema appearance without volume loss in the superficial and inferior right temporal lobe, MCA inferior division. No visible left-sided infarct. The patient is reportedly not a tPA candidate due to Eliquis use. Negative for acute hemorrhage. There is chronic small vessel ischemia in the cerebral white matter. Vascular: Atherosclerotic calcification.  No hyperdense vessel Skull: No acute finding.  Remote left orbital rim repair Sinuses/Orbits: No acute finding. Soft tissue density obliterates the left external auditory canal and causes inward bulging of  the tympanic membrane, with deficiency of left  ossicles. The soft tissue is new from 2007 neck CT, although the ossicular deficiency is likely chronic from that time. Need visual inspection to differentiate cerumen from cholesteatoma. No canal erosion. Other: These results were called by telephone at the time of interpretation on 10/14/2017 at 9:48 am to Dr. Cheral Marker, who verbally acknowledged these results. ASPECTS [facility] Stroke Program Early CT Score) Not scored given the above. IMPRESSION: 1. Small recent appearing infarct in the superficial and inferior right temporal lobe. 2. No left-sided infarct is seen in this patient with right-sided weakness. 3. Soft tissue fills the left external auditory canal and there is left ossicular deficiency. Need visual inspection to differentiate cerumen from cholesteatoma. Electronically Signed   By: Monte Fantasia M.D.   On: 10/14/2017 09:53    EKG: Independently reviewed.   Assessment/Plan Active Problems:   CVA (cerebral vascular accident) ([place])  Ct shows small recent appearing infarct in right temporal lobe Dr. Cheral Marker has seen pt and will continue to follow Mri pending,   PT, Speech, Nutrition consulted  Eliquis continued per advice of Dr. Earnestine Leys   2) Hypertension   3) Asthma Continue albuterol and current inhalers   4) Atrial Fibrillation Continue eliquis and amiodarone  Cardiac monitor   5) Hyperlipidemia Dr. Cheral Marker advised stop zocor          DVT prophylaxis:eliquis Code Status: full Family Communication: none Disposition Plan: Admission Consults called: Neurology  Dr. Cheral Marker consulted Admission status: INpatient    Alyse Low PA-C [facility]  If 7PM-7AM, please contact night-coverage [email or web address] Password [facility]  10/14/2017, 12:31 PM

## 2017-10-14 NOTE — ED Notes (Signed)
RN started second line in CT but was unable to pull back blood; phlebotomy at bedside now

## 2017-10-14 NOTE — Consult Note (Signed)
Referring Physician: Dr. Sherry Ruffing    Chief Complaint: Acute onset of dysarthria and listing to right while walking  HPI: Krystal White is an 84 y.o. female presenting from [facility] facility with acute onset of dysarthria and listing to right while walking. She performs all of her ADLs at baseline. Symptoms started while eating breakfast with sudden onset of slurred speech. When staff attempted to ambulate her, she seemed weaker on the right and was listing towards the right. She is on Eliquis for paroxysmal atrial fibrillation.  At baseline she is semi-ambulatory with a walker. She is assisted with bathing, eating and dressing by staff at her facility.  PMHx includes vascular dementia, HF, HTN, pleural effusion, asthma, pulmonary HTN, HLD, paroxysmal atrial fibrillation.   Home meds include Aricept.   LSN: [id number] tPA Given: No: On Eliquis. NIHSS: 3  Past Medical History:  Diagnosis Date  . Allergic rhinitis   . Anxiety    unspecified  . Back pain    unspecified  . Cystitis   . Edema   . Gait abnormality   . Hand pain   . Hearing loss    left ear  . Hematuria   . History of atrial fibrillation   . Hyperlipidemia, unspecified   . Hypertension   . Osteoporosis   . Osteoporosis, post-menopausal   . PAF (paroxysmal atrial fibrillation) ([place])   . Polymyalgia ([place])   . Pulmonary hypertension ([place])   . Skin cancer     Past Surgical History:  Procedure Laterality Date  . BREAST BIOPSY    . INGUINAL HERNIA REPAIR Right 2005  . KYPHOPLASTY N/A 04/17/2017   Procedure: [id number];  Surgeon: Hessie Knows, MD;  Location: [facility] ORS;  Service: Orthopedics;  Laterality: N/A;    Family History  Problem Relation Age of Onset  . Heart failure Sister   . Cancer Sister   . Aneurysm Mother   . Colon cancer Father   . Heart attack Brother   . Cancer Sister    Social History:  reports that she quit smoking about 39 years ago. Her smoking use included  cigarettes. She has a 25.00 pack-year smoking history. She has never used smokeless tobacco. She reports that she does not drink alcohol or use drugs.  Allergies: No Known Allergies  Home Medications:    ROS: Deferred due to acuity of presentation.   Physical Examination: There were no vitals taken for this visit.  HEENT: [place]/AT Lungs: Nasal cannula noted. Respirations unlabored Ext: Mild pretibial edema  Neurologic Examination: Mental Status: Alert, oriented to self, location, year and day of week. Pleasant and cooperative. Speech dysarthric. HOH but with loud speech she comprehends well. Repetition impaired. Perseverated "one" when asked to name some fingers but could name glasses and a pen. At times speech is not fluent, but most sentences are with intact grammar/syntax. Was able to follow a two step directional command without difficulty. Cranial Nerves: II:  Visual fields intact bilaterally. No extinction to DSS.  III,IV, VI: No ptosis. Horizontal EOMI. No nystagmus.  V,VII: Smile symmetric, facial temp and FT sensation equal bilaterally VIII: HOH IX,X: No hoarseness XI: Symmetric XII: midline tongue extension  Motor: RUE 4/5 with drift RLE 4+/5 LUE 5/5 LLE 4+/5 Sensory: Temp and light touch intact x 4. No extinction.  Deep Tendon Reflexes:  1-2+ throughout except for achilles bilaterally. No asymmetry.  Cerebellar: No ataxia with FNF bilaterally Gait: Deferred  No results found for this or any previous visit (from the  past 48 hour(s)). No results found.  Assessment: 84 y.o. female presenting with dysarthria and right sided weakness 1. Exam with deficits best localizable to the left frontotemporal region versus left thalamus or basal ganglia, most likely secondary to a small cardioembolic ischemic infarction. CT negative for acute abnormality. Chronic small vessel ischemic changes and old right temporal lobe infarction, as well as diffuse atrophy, are noted. 2. tPA  contraindicated due to use of blood thinner.  3. Not an endovascular candidate due to low NIHSS.  4. Atrial fibrillation. On Eliquis at her assisted living facility.  5. Dementia.  6. Stroke Risk Factors - PAF, HTN, HLD  Plan: 1. HgbA1c, fasting lipid panel 2. MRI, MRA of the brain without contrast 3. PT consult, OT consult, Speech consult 4. Echocardiogram 5. Carotid dopplers 6. Continue Eliquis. May need to be switched to an alternate anticoagulant if new stroke is verified on MRI.  7. Risk factor modification 8. Telemetry monitoring 9. Frequent neuro checks 10. Modified permissive HTN protocol due to advanced age: Treat if SBP is > 180 11. Due to advanced age, benefits of statin most likely outweighed by risks. She also has a history of polymyalgia, so would consider discontinuation of her simvastatin.     @Electronically  signed: Dr. Kerney Elbe  10/14/2017, 9:39 AM

## 2017-10-15 ENCOUNTER — Encounter (HOSPITAL_COMMUNITY): Payer: Self-pay | Admitting: *Deleted

## 2017-10-15 ENCOUNTER — Inpatient Hospital Stay (HOSPITAL_COMMUNITY): Payer: Medicare Other

## 2017-10-15 ENCOUNTER — Other Ambulatory Visit: Payer: Self-pay

## 2017-10-15 DIAGNOSIS — I6300 Cerebral infarction due to thrombosis of unspecified precerebral artery: Secondary | ICD-10-CM

## 2017-10-15 DIAGNOSIS — I4891 Unspecified atrial fibrillation: Secondary | ICD-10-CM

## 2017-10-15 LAB — BASIC METABOLIC PANEL
ANION GAP: 10 (ref 5–15)
BUN: 20 mg/dL (ref 8–23)
CHLORIDE: 100 mmol/L (ref 98–111)
CO2: 30 mmol/L (ref 22–32)
Calcium: 8.6 mg/dL — ABNORMAL LOW (ref 8.9–10.3)
Creatinine, Ser: 1.09 mg/dL — ABNORMAL HIGH (ref 0.44–1.00)
GFR calc Af Amer: 50 mL/min — ABNORMAL LOW (ref >60)
GFR, EST NON AFRICAN AMERICAN: 43 mL/min — AB (ref >60)
GLUCOSE: 95 mg/dL (ref 70–99)
POTASSIUM: 3.6 mmol/L (ref 3.5–5.1)
Sodium: 140 mmol/L (ref 135–145)

## 2017-10-15 LAB — CBC
HEMATOCRIT: 38.3 % (ref 36.0–46.0)
HEMOGLOBIN: 11.8 g/dL — AB (ref 12.0–15.0)
MCH: 28.8 pg (ref 26.0–34.0)
MCHC: 30.8 g/dL (ref 30.0–36.0)
MCV: 93.4 fL (ref 78.0–100.0)
Platelets: 179 10*3/uL (ref 150–400)
RBC: 4.1 MIL/uL (ref 3.87–5.11)
RDW: 13.9 % (ref 11.5–15.5)
WBC: 5.9 10*3/uL (ref 4.0–10.5)

## 2017-10-15 LAB — ECHOCARDIOGRAM COMPLETE

## 2017-10-15 MED ORDER — STROKE: EARLY STAGES OF RECOVERY BOOK
Freq: Once | Status: AC
  Administered 2017-10-15: 10:00:00

## 2017-10-15 NOTE — Clinical Social Work Note (Signed)
Clinical Social Work Assessment  Patient Details  Name: Krystal White MRN: [id number] Date of Birth: 09-Oct-1924  Date of referral:  10/15/17               Reason for consult:  Facility Placement                Permission sought to share information with:  Facility Sport and exercise psychologist, Guardian Permission granted to share information::  Yes, Verbal Permission Granted  Name::     [place]::  SNF  Relationship::  Legal guardian  Contact Information:     Housing/Transportation Living arrangements for the past 2 months:  [place] of Information:  Guardian Patient Interpreter Needed:  None Criminal Activity/Legal Involvement Pertinent to Current Situation/Hospitalization:  No - Comment as needed Significant Relationships:  None Lives with:  Self, Facility Resident Do you feel safe going back to the place where you live?  Yes Need for family participation in patient care:  Yes (Comment)  Care giving concerns:  Patient from ALF but will need short term rehab at discharge.    Social Worker assessment / plan:  CSW spoke with patient's legal guardian at [place] and explained patient's situation and need for a higher level of care. CSW received permission to fax out referral and follow up with facility preferences. CSW to follow.  Employment status:  Retired Nurse, adult PT Recommendations:  [place] / Referral to community resources:  [place]  Patient/Family's Response to care:  Patient's guardian agreeable to SNF placement.  Patient/Family's Understanding of and Emotional Response to Diagnosis, Current Treatment, and Prognosis:  Patient's guardian discussed how the patient's family is appealing the guardianship through the county, but that they have not changed anything as of yet so the county remains the patient's guardian. Patient's guardian is agreeable to SNF placement,  requesting [facility] at [facility] or [facility], so the patient is at a good facility where she will receive excellent care.  Emotional Assessment Appearance:  Appears stated age Attitude/Demeanor/Rapport:  Unable to Assess Affect (typically observed):  Unable to Assess Orientation:  Oriented to Self, Oriented to Place Alcohol / Substance use:  Not Applicable Psych involvement (Current and /or in the community):  No (Comment)  Discharge Needs  Concerns to be addressed:  Care Coordination Readmission within the last 30 days:  No Current discharge risk:  Physical Impairment, Dependent with Mobility Barriers to Discharge:  Continued Medical Work up, [place], [place] 10/15/2017, 4:02 PM

## 2017-10-15 NOTE — NC FL2 (Signed)
[place] LEVEL OF CARE SCREENING TOOL     IDENTIFICATION  Patient Name: Krystal White Birthdate: 1925/01/21 Sex: female Admission Date (Current Location): 10/14/2017  [facility] [place] Number:  Engineering geologist and Address:  The . [facility], [place] [address] [place], [place] [address]      Provider Number: [id number]  Attending Physician Name and Address:  Nita Sells, MD  Relative Name and Phone Number:       Current Level of Care: Hospital Recommended Level of Care: [place] Prior Approval Number:    Date Approved/Denied:   PASRR Number: [id number]  Discharge Plan: SNF    Current Diagnoses: Patient Active Problem List   Diagnosis Date Noted  . CVA (cerebral vascular accident) ([place]) 10/14/2017  . Protein-calorie malnutrition ([place]) 04/28/2017  . Vascular dementia 03/26/2017  . CHF (congestive heart failure) ([place]) 03/02/2017  . Chronic diastolic heart failure ([place]) 02/13/2017  . HTN (hypertension) 02/13/2017  . Atrial fibrillation with RVR ([place]) 02/02/2017    Orientation RESPIRATION BLADDER Height & Weight     Self, Place  Normal Incontinent Weight:   Height:     BEHAVIORAL SYMPTOMS/MOOD NEUROLOGICAL BOWEL NUTRITION STATUS      Incontinent Diet(puree solids, honey thick liquids)  AMBULATORY STATUS COMMUNICATION OF NEEDS Skin   Extensive Assist Verbally Normal                       Personal Care Assistance Level of Assistance  Bathing, Feeding, Dressing Bathing Assistance: Maximum assistance Feeding assistance: Limited assistance Dressing Assistance: Maximum assistance     Functional Limitations Info  Sight, Hearing, Speech Sight Info: Impaired Hearing Info: Adequate Speech Info: Impaired    SPECIAL CARE FACTORS FREQUENCY  PT (By licensed PT), OT (By licensed OT), Speech therapy     PT Frequency: 5x/wk OT Frequency: 5x/wk     Speech Therapy Frequency: 5x/wk       Contractures Contractures Info: Not present    Additional Factors Info  Code Status, Allergies Code Status Info: Full Allergies Info: NKA           Current Medications (10/15/2017):  This is the current hospital active medication list Current Facility-Administered Medications  Medication Dose Route Frequency Provider Last Rate Last Dose  . 0.9 %  sodium chloride infusion   Intravenous Continuous Fransico Meadow, [place] 75 mL/hr at 10/14/17 2353    . acetaminophen (TYLENOL) tablet 650 mg  650 mg Oral Q6H PRN Caryl Ada K, PA-C       Or  . acetaminophen (TYLENOL) suppository 650 mg  650 mg Rectal Q6H PRN Caryl Ada K, PA-C      . albuterol (PROVENTIL) (2.5 MG/3ML) 0.083% nebulizer solution 3 mL  3 mL Inhalation Q6H PRN Caryl Ada K, PA-C      . amiodarone (PACERONE) tablet 200 mg  200 mg Oral Daily Sofia, Leslie K, PA-C   200 mg at 10/15/17 1004  . apixaban (ELIQUIS) tablet 2.5 mg  2.5 mg Oral BID Sofia, Leslie K, PA-C   2.5 mg at 10/15/17 1004  . bisacodyl (DULCOLAX) EC tablet 5 mg  5 mg Oral Daily PRN Sofia, Leslie K, PA-C      . donepezil (ARICEPT) tablet 10 mg  10 mg Oral QHS Sofia, Leslie K, [place]      . feeding supplement (PRO-STAT SUGAR FREE 64) liquid 30 mL  30 mL Oral BID BM Caryl Ada K, PA-C   30  mL at 10/15/17 1004  . fluticasone (FLONASE) 50 MCG/ACT nasal spray 2 spray  2 spray Each Nare Daily Sofia, Leslie K, PA-C      . furosemide (LASIX) tablet 40 mg  40 mg Oral Daily Sofia, Leslie K, PA-C   40 mg at 10/15/17 1004  . hydroxypropyl methylcellulose / hypromellose (ISOPTO TEARS / GONIOVISC) 2.5 % ophthalmic solution 1 drop  1 drop Both Eyes BID PRN Einar Grad, RPH      . ketorolac (TORADOL) 15 MG/ML injection 15 mg  15 mg Intravenous Q6H PRN Sofia, Leslie K, PA-C      . mometasone-formoterol (DULERA) 200-5 MCG/ACT inhaler 2 puff  2 puff Inhalation BID Sofia, Leslie K, PA-C   2 puff at 10/15/17 0745  . oxyCODONE (Oxy IR/ROXICODONE) immediate release tablet  5 mg  5 mg Oral Q4H PRN Sofia, Leslie K, PA-C      . potassium chloride (K-DUR,KLOR-CON) CR tablet 10 mEq  10 mEq Oral Once Sofia, Leslie K, PA-C      . zolpidem (AMBIEN) tablet 5 mg  5 mg Oral QHS PRN Fransico Meadow, PA-C         Discharge Medications: Please see discharge summary for a list of discharge medications.  Relevant Imaging Results:  Relevant Lab Results:   Additional Information SS#: [id number]  Geralynn Ochs, LCSW

## 2017-10-15 NOTE — Progress Notes (Addendum)
STROKE TEAM PROGRESS NOTE  HPI: ( Dr Cheral Marker )Krystal White is an 84 y.o. female presenting from [facility] assisted [facility] facility with acute onset of dysarthria and listing to right while walking. She performs all of her ADLs at baseline. Symptoms started while eating breakfast with sudden onset of slurred speech. When staff attempted to ambulate her, she seemed weaker on the right and was listing towards the right. She is on Eliquis for paroxysmal atrial fibrillation.  At baseline she is semi-ambulatory with a walker. She is assisted with bathing, eating and dressing by staff at her facility.  PMHx includes vascular dementia, HF, HTN, pleural effusion, asthma, pulmonary HTN, HLD, paroxysmal atrial fibrillation.   Home meds include Aricept.   LSN: [id number] tPA Given: No: On Eliquis. NIHSS: 3 INTERVAL HISTORY No family is at the bedside.  She denies missing any eliquis doses.   Phone call placed to son, who later returned call. Dr. Leonie Man met with him after lunch to discuss pt condition -son currently undergoing court process to regain guardianship of his mother.  He understands diagnosis and prognosis.  Agreeable with plans for SNF.  Note legal guardian. Phone call also placed to [facility] w/ message left.   Above information shared with Education officer, museum and attending physician.  Vitals:   10/15/17 0020 10/15/17 0420 10/15/17 0746 10/15/17 0822  BP: (!) 157/67 (!) 151/70  (!) 152/51  Pulse: 74 71  68  Resp: 13 14  18   Temp: 98.2 F (36.8 C) 98.4 F (36.9 C)  98.5 F (36.9 C)  TempSrc: Oral Oral  Oral  SpO2: 93% 94% 96% 93%    CBC:  Recent Labs  Lab 10/14/17 1031 10/14/17 1041 10/15/17 0421  WBC 5.5  --  5.9  NEUTROABS 3.1  --   --   HGB 11.5* 12.2 11.8*  HCT 38.1 36.0 38.3  MCV 96.5  --  93.4  PLT 168  --  297    Basic Metabolic Panel:  Recent Labs  Lab 10/14/17 1031 10/14/17 1041 10/15/17 0421  NA 140 139 140  K 4.0 3.9 3.6  CL 103 98 100  CO2 31  --  30   GLUCOSE 103* 99 95  BUN 24* 27* 20  CREATININE 1.19* 1.20* 1.09*  CALCIUM 8.9  --  8.6*   Lipid Panel: No results found for: CHOL, TRIG, HDL, CHOLHDL, VLDL, LDLCALC HgbA1c: No results found for: HGBA1C Urine Drug Screen: No results found for: LABOPIA, COCAINSCRNUR, LABBENZ, AMPHETMU, THCU, LABBARB  Alcohol Level No results found for: [facility]  IMAGING Mr Jodene Nam Head Wo Contrast  Addendum Date: 10/14/2017   ADDENDUM REPORT: 10/14/2017 14:16 ADDENDUM: Restricted diffusion within soft tissue of the left external auditory canal is highly concerning for an EAC cholesteatoma. Electronically Signed   By: San Morelle M.D.   On: 10/14/2017 14:16   Result Date: 10/14/2017 CLINICAL DATA:  Slurred speech. Right-sided weakness. Symptoms began 4 hours ago. Leaning to the right while walking. EXAM: MRI HEAD WITHOUT CONTRAST MRA HEAD WITHOUT CONTRAST TECHNIQUE: Multiplanar, multiecho pulse sequences of the brain and surrounding structures were obtained without intravenous contrast. Angiographic images of the head were obtained using MRA technique without contrast. COMPARISON:  CT head without contrast 10/14/2017. FINDINGS: MRI HEAD FINDINGS Brain: Acute nonhemorrhagic infarct is confirmed within the right inferior temporal gyrus. Infarct measures up to 1.7 cm in maximal dimension. A subacute infarct in the anterior left pons appears slightly older. A punctate infarct in the superior right cerebellum is acute.  A punctate posterior right frontal lobe infarct is present on image 36 of series 3. This appears acute. Diffuse white matter changes are present. Focal T2 signal changes are associated with the areas of restricted diffusion mentioned above. More remote right cerebellar infarcts are present as well. Dilated perivascular spaces are present throughout the basal ganglia. The ventricles are proportionate to the degree of atrophy. No significant extra-axial fluid collection is present. Vascular: Flow is present in  the major intracranial arteries. Skull and upper cervical spine: The skull base is within normal limits. Craniocervical junction is normal. Sinuses/Orbits: The paranasal sinuses and mastoid air cells are clear. Bilateral lens replacements are present. Globes and orbits are otherwise within normal limits. MRA HEAD FINDINGS The internal carotid arteries are within normal limits from the high cervical segments through the ICA termini bilaterally. The left A1 segment is dominant. The anterior communicating artery is patent. MCA bifurcations are normal. ACA and MCA branch vessels are within normal limits. The left vertebral artery is dominant. PICA origins are visualized and normal. The left posterior cerebral artery originates from a normal basilar tip. The right posterior cerebral artery is of fetal type. A small right P1 segment and infundibulum is present. There is no aneurysm, significant proximal stenosis, or branch vessel occlusion. IMPRESSION: 1. Acute nonhemorrhagic infarcts involving the right inferior temporal gyrus and right superior cerebellum. 2. Acute/subacute nonhemorrhagic infarct involving the anterior left pons, likely major cortical spinal tracts. 3. Diffuse white matter disease likely reflects the sequela of chronic microvascular ischemia. 4. Normal variant MRA circle-of-Willis without significant proximal stenosis, aneurysm, or branch vessel occlusion. Electronically Signed: By: San Morelle M.D. On: 10/14/2017 13:37   Mr Brain Wo Contrast  Addendum Date: 10/14/2017   ADDENDUM REPORT: 10/14/2017 14:16 ADDENDUM: Restricted diffusion within soft tissue of the left external auditory canal is highly concerning for an EAC cholesteatoma. Electronically Signed   By: San Morelle M.D.   On: 10/14/2017 14:16   Result Date: 10/14/2017 CLINICAL DATA:  Slurred speech. Right-sided weakness. Symptoms began 4 hours ago. Leaning to the right while walking. EXAM: MRI HEAD WITHOUT CONTRAST MRA  HEAD WITHOUT CONTRAST TECHNIQUE: Multiplanar, multiecho pulse sequences of the brain and surrounding structures were obtained without intravenous contrast. Angiographic images of the head were obtained using MRA technique without contrast. COMPARISON:  CT head without contrast 10/14/2017. FINDINGS: MRI HEAD FINDINGS Brain: Acute nonhemorrhagic infarct is confirmed within the right inferior temporal gyrus. Infarct measures up to 1.7 cm in maximal dimension. A subacute infarct in the anterior left pons appears slightly older. A punctate infarct in the superior right cerebellum is acute. A punctate posterior right frontal lobe infarct is present on image 36 of series 3. This appears acute. Diffuse white matter changes are present. Focal T2 signal changes are associated with the areas of restricted diffusion mentioned above. More remote right cerebellar infarcts are present as well. Dilated perivascular spaces are present throughout the basal ganglia. The ventricles are proportionate to the degree of atrophy. No significant extra-axial fluid collection is present. Vascular: Flow is present in the major intracranial arteries. Skull and upper cervical spine: The skull base is within normal limits. Craniocervical junction is normal. Sinuses/Orbits: The paranasal sinuses and mastoid air cells are clear. Bilateral lens replacements are present. Globes and orbits are otherwise within normal limits. MRA HEAD FINDINGS The internal carotid arteries are within normal limits from the high cervical segments through the ICA termini bilaterally. The left A1 segment is dominant. The anterior communicating artery is  patent. MCA bifurcations are normal. ACA and MCA branch vessels are within normal limits. The left vertebral artery is dominant. PICA origins are visualized and normal. The left posterior cerebral artery originates from a normal basilar tip. The right posterior cerebral artery is of fetal type. A small right P1 segment and  infundibulum is present. There is no aneurysm, significant proximal stenosis, or branch vessel occlusion. IMPRESSION: 1. Acute nonhemorrhagic infarcts involving the right inferior temporal gyrus and right superior cerebellum. 2. Acute/subacute nonhemorrhagic infarct involving the anterior left pons, likely major cortical spinal tracts. 3. Diffuse white matter disease likely reflects the sequela of chronic microvascular ischemia. 4. Normal variant MRA circle-of-Willis without significant proximal stenosis, aneurysm, or branch vessel occlusion. Electronically Signed: By: San Morelle M.D. On: 10/14/2017 13:37   Dg Swallowing Func-speech Pathology  Result Date: 10/15/2017 Objective Swallowing Evaluation: Type of Study: MBS-Modified Barium Swallow Study  Patient Details Name: Krystal White MRN: [id number] Date of Birth: 1924/09/14 Today's Date: 10/15/2017 Time: SLP Start Time (ACUTE ONLY): 0858 -SLP Stop Time (ACUTE ONLY): 0915 SLP Time Calculation (min) (ACUTE ONLY): 17 min Past Medical History: Past Medical History: Diagnosis Date . Allergic rhinitis  . Anxiety   unspecified . Back pain   unspecified . Cystitis  . Edema  . Gait abnormality  . Hand pain  . Hearing loss   left ear . Hematuria  . History of atrial fibrillation  . Hyperlipidemia, unspecified  . Hypertension  . Osteoporosis  . Osteoporosis, post-menopausal  . PAF (paroxysmal atrial fibrillation) ([place])  . Polymyalgia ([place])  . Pulmonary hypertension ([place])  . Skin cancer  Past Surgical History: Past Surgical History: Procedure Laterality Date . BREAST BIOPSY   . INGUINAL HERNIA REPAIR Right 2005 . KYPHOPLASTY N/A 04/17/2017  Procedure: [id number];  Surgeon: Hessie Knows, MD;  Location: [facility] ORS;  Service: Orthopedics;  Laterality: N/A; HPI: TALEYAH HILLMAN is a 84 y.o. female with medical history significant of Atrial fibrillation, CHF, Hypertension and dementia who developed sudden onset of slurred speech and weakness.  MRI with acute  nonhemorrhagic infarcts involving the right inferior  No data recorded Assessment / Plan / Recommendation CHL IP CLINICAL IMPRESSIONS 10/15/2017 Clinical Impression Patient presents with a severe oral dysphagia with secondary pharyngeal and esophageal components. CN deficits result in severe oral weakness, primarily right sided, with poor labial seal and lingual movement/ROM. Significant anterior labial spillage of all boluses noted, improving but not eliminated with use of tsp presentation. Loss of bolus over the base of tongue with delayed swallow initiation noted across consistencies with resultant deep penetration and silent aspiration of thin liquids intermittently. Additionally, patient with severe stasis through out esophagus with retrograde flow of bolus into the pyriform sinuses post swallow. Note that thinner consistencies including nectar thick do aid in improved esophageal clearance but increase aspiration risk. Will initiate a conservative diet and f/u at bedside for trials of nectar thick liquid via tsp (not aspirated during the study but at risk) for hopeful upgrade.  SLP Visit Diagnosis Dysphagia, oropharyngeal phase (R13.12) Attention and concentration deficit following -- Frontal lobe and executive function deficit following -- Impact on safety and function Moderate aspiration risk   CHL IP TREATMENT RECOMMENDATION 10/15/2017 Treatment Recommendations Therapy as outlined in treatment plan below   Prognosis 10/15/2017 Prognosis for Safe Diet Advancement Good Barriers to Reach Goals Severity of deficits Barriers/Prognosis Comment -- CHL IP DIET RECOMMENDATION 10/15/2017 SLP Diet Recommendations Dysphagia 1 (Puree) solids;Honey thick liquids Liquid Administration via Spoon Medication Administration Crushed with  puree Compensations Slow rate;Small sips/bites;Multiple dry swallows after each bite/sip Postural Changes Seated upright at 90 degrees;Remain semi-upright after after feeds/meals (Comment)   CHL IP  OTHER RECOMMENDATIONS 10/15/2017 Recommended Consults -- Oral Care Recommendations Oral care BID Other Recommendations Order thickener from pharmacy;Prohibited food (jello, ice cream, thin soups);Remove water pitcher   CHL IP FOLLOW UP RECOMMENDATIONS 10/15/2017 Follow up Recommendations Skilled Nursing facility   [facility] IP FREQUENCY AND DURATION 10/15/2017 Speech Therapy Frequency (ACUTE ONLY) min 3x week Treatment Duration 2 weeks      CHL IP ORAL PHASE 10/15/2017 Oral Phase Impaired Oral - Pudding Teaspoon -- Oral - Pudding Cup -- Oral - Honey Teaspoon Right anterior bolus loss;Lingual pumping;Delayed oral transit;Decreased bolus cohesion Oral - Honey Cup Right anterior bolus loss;Lingual pumping;Delayed oral transit;Decreased bolus cohesion Oral - Nectar Teaspoon Right anterior bolus loss;Lingual pumping;Delayed oral transit;Decreased bolus cohesion Oral - Nectar Cup Right anterior bolus loss;Lingual pumping;Delayed oral transit;Decreased bolus cohesion Oral - Nectar Straw -- Oral - Thin Teaspoon Right anterior bolus loss;Lingual pumping;Delayed oral transit;Decreased bolus cohesion Oral - Thin Cup Right anterior bolus loss;Lingual pumping;Delayed oral transit;Decreased bolus cohesion Oral - Thin Straw Right anterior bolus loss;Lingual pumping;Delayed oral transit;Decreased bolus cohesion Oral - Puree Right anterior bolus loss;Lingual pumping;Delayed oral transit;Decreased bolus cohesion Oral - Mech Soft -- Oral - Regular -- Oral - Multi-Consistency -- Oral - Pill -- Oral Phase - Comment --  CHL IP PHARYNGEAL PHASE 10/15/2017 Pharyngeal Phase Impaired Pharyngeal- Pudding Teaspoon -- Pharyngeal -- Pharyngeal- Pudding Cup -- Pharyngeal -- Pharyngeal- Honey Teaspoon Delayed swallow initiation-pyriform sinuses;Delayed swallow initiation-vallecula Pharyngeal -- Pharyngeal- Honey Cup Delayed swallow initiation-vallecula;Delayed swallow initiation-pyriform sinuses Pharyngeal -- Pharyngeal- Nectar Teaspoon Delayed swallow  initiation-pyriform sinuses Pharyngeal -- Pharyngeal- Nectar Cup Delayed swallow initiation-pyriform sinuses;Penetration/Apiration after swallow Pharyngeal Material enters airway, CONTACTS cords and then ejected out Pharyngeal- Nectar Straw -- Pharyngeal -- Pharyngeal- Thin Teaspoon Delayed swallow initiation-pyriform sinuses;Penetration/Aspiration during swallow Pharyngeal Material enters airway, CONTACTS cords and not ejected out Pharyngeal- Thin Cup Penetration/Aspiration during swallow Pharyngeal Material enters airway, passes BELOW cords without attempt by patient to eject out (silent aspiration) Pharyngeal- Thin Straw Penetration/Aspiration during swallow;Delayed swallow initiation-pyriform sinuses Pharyngeal Material enters airway, passes BELOW cords without attempt by patient to eject out (silent aspiration) Pharyngeal- Puree Delayed swallow initiation-vallecula Pharyngeal -- Pharyngeal- Mechanical Soft -- Pharyngeal -- Pharyngeal- Regular -- Pharyngeal -- Pharyngeal- Multi-consistency -- Pharyngeal -- Pharyngeal- Pill -- Pharyngeal -- Pharyngeal Comment --  CHL IP CERVICAL ESOPHAGEAL PHASE 10/15/2017 Cervical Esophageal Phase Impaired Pudding Teaspoon -- Pudding Cup -- Honey Teaspoon -- Honey Cup -- Nectar Teaspoon -- Nectar Cup -- Nectar Straw -- Thin Teaspoon -- Thin Cup -- Thin Straw -- Puree -- Mechanical Soft -- Regular -- Multi-consistency -- Pill -- Cervical Esophageal Comment see impression statement Gabriel Rainwater MA, CCC-SLP McCoy Leah Meryl 10/15/2017, 9:39 AM              Ct Head Code Stroke Wo Contrast  Result Date: 10/14/2017 CLINICAL DATA:  Code stroke. Sudden onset of slurred speech with right-sided weakness EXAM: CT HEAD WITHOUT CONTRAST TECHNIQUE: Contiguous axial images were obtained from the base of the skull through the vertex without intravenous contrast. COMPARISON:  08/19/2013 brain MRI FINDINGS: Brain: Cytotoxic edema appearance without volume loss in the superficial and inferior  right temporal lobe, MCA inferior division. No visible left-sided infarct. The patient is reportedly not a tPA candidate due to Eliquis use. Negative for acute hemorrhage. There is chronic small vessel ischemia in the cerebral white matter. Vascular: Atherosclerotic  calcification.  No hyperdense vessel Skull: No acute finding.  Remote left orbital rim repair Sinuses/Orbits: No acute finding. Soft tissue density obliterates the left external auditory canal and causes inward bulging of the tympanic membrane, with deficiency of left ossicles. The soft tissue is new from 2007 neck CT, although the ossicular deficiency is likely chronic from that time. Need visual inspection to differentiate cerumen from cholesteatoma. No canal erosion. Other: These results were called by telephone at the time of interpretation on 10/14/2017 at 9:48 am to Dr. Cheral Marker, who verbally acknowledged these results. ASPECTS [facility] Stroke Program Early CT Score) Not scored given the above. IMPRESSION: 1. Small recent appearing infarct in the superficial and inferior right temporal lobe. 2. No left-sided infarct is seen in this patient with right-sided weakness. 3. Soft tissue fills the left external auditory canal and there is left ossicular deficiency. Need visual inspection to differentiate cerumen from cholesteatoma. Electronically Signed   By: Monte Fantasia M.D.   On: 10/14/2017 09:53    PHYSICAL EXAM  frail elderly Caucasian lady not in distress. . Afebrile. Head is nontraumatic. Neck is supple without bruit.    Cardiac exam no murmur or gallop. Lungs are clear to auscultation. Distal pulses are well felt. Neurological Exam : awake alert oriented 2. Severe dysarthria and can be understood with great difficulty. Can follow simple midline and one-step commands only. Extraocular movements are full range without nystagmus. Blinks to threat bilaterally. Right lower facial weakness. Tongue midline. Motor system exam right hemiplegia with  2-3/5 strength in the right upper extremity and 3/5 strength in right lower extremity. 4+/5 strength in left upper and lower extremities. Deep tendon reflexes are depressed on the right and normal on the left right plantar upgoing left downgoing. Sensation appears intact bilaterally. Gait not tested.  ASSESSMENT/PLAN Krystal White is a 84 y.o. female with history of PAF on eliquis, CHF, HTN, pleural effusion, asthma, pulm HTN, HLD presenting with dysarthria and listing to the R.   Stroke:  right inferior temporal gyrs and R superior cerebellar infarcts, embolic secondary to known atrial fibrillation on eliquis  Code Stroke CT head No left brain acute stroke.  Small recent right temporal lobe infarct.  Soft tissue and left external auditory canal with left ossicular deficiency.    MRI right inferior temporal gyrus and right superior cerebellar infarcts.  Acute/subacute anterior left pontine infarct.  Chronic microvascular ischemia.  Left external auditory canal EAC cholesteatoma.   MRA normal variant MRA  Carotid Doppler  pending   2D Echo  pending   LDL ordered  HgbA1c ordered  Eliquis for VTE prophylaxis  Eliquis (apixaban) daily 2.5 bid prior to admission, now on Eliquis (apixaban) daily. Continue Eliquis for secondary stroke prevention (on low dose given renal dysfunction)  Therapy recommendations: SNF (from [facility], has legal guardian)  Disposition: Pending  Paroxysmal atrial Fibrillation  Home anticoagulation:  Eliquis (apixaban) daily continued in the hospital . Continue Eliquis (apixaban) daily at discharge   Dysphagia  Secondary to stroke Diet Order            DIET - DYS 1 Room service appropriate? Yes; Fluid consistency: Honey Thick  Diet effective now              Hypertension  Stable . Permissive hypertension (OK if < 220/120) but gradually normalize in 5-7 days . Long-term BP goal normotensive  Hyperlipidemia  Home meds: Zocor 20  Dr.  Cheral Marker suggested risk outweigh benefits and with hx of  polymyalgia, to not resume  LDL pending, goal < 70  Other Stroke Risk Factors  Advanced age  Former Cigarette smoker, quit 39 years ago  Other Active Problems  Baseline vascular dementia  History polymyalgia rheumatica  Hearing loss left ear, question related to left cholesteatoma seen on MRI  Hospital day # [place], MSN, APRN, ANVP-BC, AGPCNP-BC Advanced Practice Stroke Nurse [place] for Schedule & Pager information 10/15/2017 2:13 PM  I have personally examined this patient, reviewed notes, independently viewed imaging studies, participated in medical decision making and plan of care.ROS completed by me personally and pertinent positives fully documented  I have made any additions or clarifications directly to the above note. Agree with note above. She has presented with several small cardioembolic strokes despite being on anticoagulation with eliquis for atrial fibrillation. She has mild baseline dementia and has suspect her quality of life is going to be further impaired with the strokes. I had a long discussion with the patient as well as with her son and other family members at the bedside and answered questions about her care. We discussed lack of definitive data suggesting benefit of other newer anticoagulants for eliquis and family and patient want to continue it despite its failure. Continue eliquis and finish ongoing stroke workup. Transfer to rehabilitation in a skilled nursing facility for the next few days. Discussed with Dr. Verlon Au. Greater than 50% time during this 35 minute visit was spent in counseling and coordination of care about her stroke, atrial fibrillation, anticoagulation and answered questions  Antony Contras, [place] Pager: [phone] 10/15/2017 2:53 PM  To contact Stroke Continuity provider, please refer to [email or web address] After  hours, contact General Neurology

## 2017-10-15 NOTE — Evaluation (Signed)
Clinical/Bedside Swallow Evaluation Patient Details  Name: Krystal White MRN: [id number] Date of Birth: 1924/03/30  Today's Date: 10/15/2017 Time: SLP Start Time (ACUTE ONLY): 0758 SLP Stop Time (ACUTE ONLY): 0815 SLP Time Calculation (min) (ACUTE ONLY): 17 min  Past Medical History:  Past Medical History:  Diagnosis Date  . Allergic rhinitis   . Anxiety    unspecified  . Back pain    unspecified  . Cystitis   . Edema   . Gait abnormality   . Hand pain   . Hearing loss    left ear  . Hematuria   . History of atrial fibrillation   . Hyperlipidemia, unspecified   . Hypertension   . Osteoporosis   . Osteoporosis, post-menopausal   . PAF (paroxysmal atrial fibrillation) ([place])   . Polymyalgia ([place])   . Pulmonary hypertension ([place])   . Skin cancer    Past Surgical History:  Past Surgical History:  Procedure Laterality Date  . BREAST BIOPSY    . INGUINAL HERNIA REPAIR Right 2005  . KYPHOPLASTY N/A 04/17/2017   Procedure: Krystal White;  Surgeon: Hessie Knows, MD;  Location: [facility] ORS;  Service: Orthopedics;  Laterality: N/A;   HPI:  Krystal White is a 84 y.o. female with medical history significant of Atrial fibrillation, CHF, Hypertension and dementia who developed sudden onset of slurred speech and weakness.  MRI with acute nonhemorrhagic infarcts involving the right inferior   Assessment / Plan / Recommendation Clinical Impression  Patient presents with a severe oral dysphagia with suspected pharyngeal component. CN VII and XII involvement with resultant significant right sided facial weakness and decreased ROM noted. Severe anterior labial spillage of liquids noted with suspected delayed swallow initiation, multiple swallows per bite and sip, and although no overt indication of aspiration, suspicious for silent aspiration given extent of neuro deficits. Plan for MBS at 0900 this am to determine potential to advance diet.  SLP Visit Diagnosis: Dysphagia,  oropharyngeal phase (R13.12)       Diet Recommendation NPO   Medication Administration: Via alternative means    Other  Recommendations Oral Care Recommendations: Oral care QID   Follow up Recommendations Skilled Nursing facility             Prognosis Prognosis for Safe Diet Advancement: Fair Barriers to Reach Goals: Severity of deficits      Swallow Study   General HPI: Krystal White is a 84 y.o. female with medical history significant of Atrial fibrillation, CHF, Hypertension and dementia who developed sudden onset of slurred speech and weakness.  MRI with acute nonhemorrhagic infarcts involving the right inferior Type of Study: Bedside Swallow Evaluation Previous Swallow Assessment: none Diet Prior to this Study: NPO Temperature Spikes Noted: No Respiratory Status: Nasal cannula History of Recent Intubation: No Behavior/Cognition: Alert;Cooperative;Pleasant mood Oral Cavity Assessment: Dry Oral Care Completed by SLP: Yes Oral Cavity - Dentition: Dentures, top;Dentures, bottom(loose) Vision: Functional for self-feeding Self-Feeding Abilities: Able to feed self Patient Positioning: Upright in bed Baseline Vocal Quality: Breathy Volitional Cough: Weak Volitional Swallow: Able to elicit    Oral/Motor/Sensory Function Overall Oral Motor/Sensory Function: Severe impairment Facial ROM: Reduced right;Suspected CN VII (facial) dysfunction Facial Symmetry: Abnormal symmetry right;Suspected CN VII (facial) dysfunction Facial Strength: Reduced right;Suspected CN VII (facial) dysfunction Facial Sensation: Within Functional Limits Lingual ROM: Reduced right;Reduced left;Suspected CN XII (hypoglossal) dysfunction Lingual Symmetry: Abnormal symmetry right;Suspected CN XII (hypoglossal) dysfunction Lingual Strength: Suspected CN XII (hypoglossal) dysfunction;Reduced Lingual Sensation: Within Functional Limits Velum: Within Functional Limits Mandible:  Suspected CN V  (Trigeminal) dysfunction;Impaired(possible)   Ice Chips Ice chips: Impaired Presentation: Spoon Oral Phase Impairments: Reduced labial seal;Reduced lingual movement/coordination;Impaired mastication Oral Phase Functional Implications: Right anterior spillage;Prolonged oral transit;Oral residue Pharyngeal Phase Impairments: Multiple swallows   Thin Liquid Thin Liquid: Impaired Presentation: Cup;Self Fed Oral Phase Impairments: Reduced labial seal;Reduced lingual movement/coordination Oral Phase Functional Implications: Right anterior spillage;Prolonged oral transit;Oral residue Pharyngeal  Phase Impairments: Multiple swallows    Nectar Thick Nectar Thick Liquid: Not tested   Honey Thick Honey Thick Liquid: Not tested   Puree Puree: Impaired Presentation: Spoon Oral Phase Impairments: Reduced labial seal;Reduced lingual movement/coordination;Impaired mastication Oral Phase Functional Implications: Right lateral sulci pocketing;Oral residue;Prolonged oral transit   Solid     Solid: Not tested     Gabriel Rainwater MA, CCC-SLP   Ashraf Mesta Meryl 10/15/2017,8:20 AM

## 2017-10-15 NOTE — Progress Notes (Signed)
[facility] PROGRESS NOTE  Krystal White DPO:[id number] DOB: [birth year] DOA: 10/14/2017 PCP: Derinda Late, MD   Narrative: 84 year old white female, kyphoplasty T12 03/2017 A. fib chads score >2 on amiodarone, Eliquis 2.5 twice daily Diastolic heart failure-admitted earlier in January 2019  Lipidemia COPD  Admitted with new CVA slurred speech dysarthria and leaning to the right baseline is semiambulatory with walker needs assistance with ADLs   A & Plan Acute CVA right temporal, right superior cerebellar infarcts-work-up currently pending in terms of A1c/LDL but if able should be able to return to skilled level care if available at [facility] assisted living in a.m.  Paroxysmal A. fib on room/Eliquis-continue the same  Moderate aortic stenosis on echo 8/22-defer management at this juncture  Hyperlipidemia continue statin  Dysphagia-Initially was n.p.o. now currently on dysphagia 1  Chronic kidney disease stage I-II-creatinine seems to be at baseline  Mild vascular dementia    Full code, no family, inpatient, pending work-up   Verlon Au, MD  [facility] Direct contact: [phone] --Via amion app OR  --[email or web address]; password [email or web address]  7PM-7AM contact night coverage as above 10/15/2017, 3:42 PM  LOS: 1 day   Consultants:  Neurology  Procedures:  Multiple  Antimicrobials:  None  Interval history/Subjective: Awake alert understands what I am saying but unable to clearly phonate given severity of stroke Follows commands No fever no headache Densely weak on the right side  Objective:  Vitals:  Vitals:   10/15/17 0822 10/15/17 1145  BP: (!) 152/51 (!) 146/50  Pulse: 68 68  Resp: 18 18  Temp: 98.5 F (36.9 C) 99.3 F (37.4 C)  SpO2: 93% 94%    Exam:  . EOMI, dense right hemiparesis and plegia to the face . Grade 0/5 power right side reflexes are brisk sensory is intact however bilaterally smile is asymmetric, orbicularis oris and  frontalis muscles asymmetric, vision not tested . Power in lower extremities is intact 5/5 power again brisk reflexes . Chest is clear   I have personally reviewed the following:   Labs:  Urine/creatinine down from 27/1.2-->20/1  Hemoglobin 11.8 platelet 179  Imaging studies:  Vascular carotids 1 to 39% ICA stenosis  Echocardiogram is pending  Medical tests:  No  Test discussed with performing physician:  n  Decision to obtain old records:  n  Review and summation of old records:  n  Scheduled Meds: . amiodarone  200 mg Oral Daily  . apixaban  2.5 mg Oral BID  . donepezil  10 mg Oral QHS  . feeding supplement (PRO-STAT SUGAR FREE 64)  30 mL Oral BID BM  . fluticasone  2 spray Each Nare Daily  . furosemide  40 mg Oral Daily  . mometasone-formoterol  2 puff Inhalation BID  . potassium chloride  10 mEq Oral Once   Continuous Infusions: . sodium chloride 75 mL/hr at 10/14/17 2353    Active Problems:   CVA (cerebral vascular accident) ([place])   LOS: 1 day

## 2017-10-15 NOTE — Evaluation (Signed)
Occupational Therapy Evaluation Patient Details Name: Krystal White MRN: [id number] DOB: October 26, 1924 Today's Date: 10/15/2017    History of Present Illness Krystal White is a 84 y.o. female with medical history significant of Atrial fibrillation, CHF, Hypertension and dementia who developed sudden onset of slurred speech and weakness    Clinical Impression   Pt admitted with the above diagnoses and presents with below problem list. Pt will benefit from continued acute OT to address the below listed deficits and maximize independence with basic ADLs prior to d/c to venue below. Pt has experienced an increase in level of assistance with ADLs and functional mobility/transfers compared to PTA. No family present on OT eval. Pt was max A to stand, max +2 A to attempt to ambulate (ultimately unsuccessful this session).       Follow Up Recommendations  SNF    Equipment Recommendations  Other (comment)(defer to next venue)    Recommendations for Other Services       Precautions / Restrictions Precautions Precautions: Fall Precaution Comments: R weakness Restrictions Weight Bearing Restrictions: No      Mobility Bed Mobility               General bed mobility comments: up in chair  Transfers Overall transfer level: Needs assistance Equipment used: Rolling walker (2 wheeled);2 person hand held assist Transfers: Sit to/from Stand Sit to Stand: +2 physical assistance;Max assist         General transfer comment: cues and assist for R LE extension; trunk extension and for R hand placement on walker; initially with bilat HHA, then with walker due to pt familiarity    Balance Overall balance assessment: Needs assistance Sitting-balance support: Feet supported Sitting balance-Leahy Scale: Poor Sitting balance - Comments: R trunk collapsing when sitting edge of chair     Standing balance-Leahy Scale: Zero Standing balance comment: max A of 2 for standing static                            ADL either performed or assessed with clinical judgement   ADL Overall ADL's : Needs assistance/impaired Eating/Feeding: Sitting;Moderate assistance   Grooming: Maximal assistance;Sitting   Upper Body Bathing: Maximal assistance;Sitting   Lower Body Bathing: Maximal assistance;+2 for physical assistance;Sit to/from stand;Sitting/lateral leans   Upper Body Dressing : Maximal assistance;Sitting   Lower Body Dressing: Maximal assistance;+2 for physical assistance;Sitting/lateral leans;Sit to/from stand                 General ADL Comments: Pt received in chair. Sit<>stand 2x with max A. Attempted but unable to complte functional mobility.      Vision         Perception     Praxis      Pertinent Vitals/Pain Pain Assessment: No/denies pain     Hand Dominance Right   Extremity/Trunk Assessment Upper Extremity Assessment Upper Extremity Assessment: RUE deficits/detail RUE Deficits / Details: 4/5 gross strength   Lower Extremity Assessment Lower Extremity Assessment: Defer to PT evaluation RLE Deficits / Details: AAROM WFL; strength grossly 2/5   Cervical / Trunk Assessment Cervical / Trunk Assessment: Kyphotic   Communication Communication Communication: HOH;Other (comment)(dysarthria)   Cognition Arousal/Alertness: Awake/alert Behavior During Therapy: WFL for tasks assessed/performed Overall Cognitive Status: Difficult to assess Area of Impairment: Orientation;Safety/judgement                 Orientation Level: Disoriented to;Place(did not assess time and situation)  Safety/Judgement: Decreased awareness of safety;Decreased awareness of deficits         General Comments  Nursing reports got her up to chair with basic squat pivot to L to drop arm recliner with +2 A    Exercises     Shoulder Instructions      Home Living Family/patient expects to be discharged to:: Skilled nursing facility                                  Additional Comments: was at ALF per chart; patient relates she is from home      Prior Functioning/Environment                   OT Problem List: Decreased strength;Decreased range of motion;Decreased activity tolerance;Impaired balance (sitting and/or standing);Decreased coordination;Decreased cognition;Decreased knowledge of use of DME or AE;Decreased knowledge of precautions;Impaired tone;Impaired UE functional use      OT Treatment/Interventions: Self-care/ADL training;Therapeutic exercise;Neuromuscular education;DME and/or AE instruction;Therapeutic activities;Patient/family education;Balance training;Cognitive remediation/compensation    OT Goals(Current goals can be found in the care plan section) Acute Rehab OT Goals Patient Stated Goal: none stated OT Goal Formulation: With patient Time For Goal Achievement: 10/29/17 Potential to Achieve Goals: Good ADL Goals Pt Will Perform Grooming: with min assist;sitting Pt Will Perform Upper Body Bathing: with min assist;sitting Pt Will Perform Lower Body Bathing: with max assist;sit to/from stand Pt Will Perform Upper Body Dressing: sitting;with min assist Pt Will Perform Lower Body Dressing: with max assist;sit to/from stand Pt Will Transfer to Toilet: with max assist;bedside commode;stand pivot transfer Pt Will Perform Toileting - Clothing Manipulation and hygiene: with max assist;sit to/from stand Pt/caregiver will Perform Home Exercise Program: Increased ROM;Increased strength;Right Upper extremity;With written HEP provided;With minimal assist Additional ADL Goal #1: Pt will complete bed mobility at mod A level to prepare for OOB ADLs.  OT Frequency: Min 2X/week   Barriers to D/C:            Co-evaluation PT/OT/SLP Co-Evaluation/Treatment: Yes Reason for Co-Treatment: For patient/therapist safety;To address functional/ADL transfers PT goals addressed during session: Mobility/safety with  mobility;Balance;Proper use of DME;Strengthening/ROM OT goals addressed during session: ADL's and self-care;Strengthening/ROM      AM-PAC PT "6 Clicks" Daily Activity     Outcome Measure Help from another person eating meals?: A Little Help from another person taking care of personal grooming?: A Lot Help from another person toileting, which includes using toliet, bedpan, or urinal?: Total Help from another person bathing (including washing, rinsing, drying)?: Total Help from another person to put on and taking off regular upper body clothing?: A Lot Help from another person to put on and taking off regular lower body clothing?: Total 6 Click Score: 10   End of Session Equipment Utilized During Treatment: Gait belt;Rolling walker Nurse Communication: Mobility status;Precautions  Activity Tolerance: Patient limited by fatigue Patient left: in chair;with call bell/phone within reach;with chair alarm set  OT Visit Diagnosis: Unsteadiness on feet (R26.81);Hemiplegia and hemiparesis;Other symptoms and signs involving cognitive function;Cognitive communication deficit (R41.841) Hemiplegia - Right/Left: Right Hemiplegia - dominant/non-dominant: Dominant                Time: 1517-6160 OT Time Calculation (min): 24 min Charges:  OT General Charges $OT Visit: 1 Visit OT Evaluation $OT Eval Moderate Complexity: 1 Mod    Hortencia Pilar 10/15/2017, 1:14 PM

## 2017-10-15 NOTE — Progress Notes (Signed)
Modified Barium Swallow Progress Note  Patient Details  Name: Krystal White MRN: [id number] Date of Birth: [birth year]  Today's Date: 10/15/2017  Modified Barium Swallow completed.  Full report located under Chart Review in the Imaging Section.  Brief recommendations include the following:  Clinical Impression  Patient presents with a severe oral dysphagia with secondary pharyngeal and esophageal components. CN deficits result in severe oral weakness, primarily right sided, with poor labial seal and lingual movement/ROM. Significant anterior labial spillage of all boluses noted, improving but not eliminated with use of tsp presentation. Loss of bolus over the base of tongue with delayed swallow initiation noted across consistencies with resultant deep penetration and silent aspiration of thin liquids intermittently. Additionally, patient with severe stasis through out esophagus with retrograde flow of bolus into the pyriform sinuses post swallow. Note that thinner consistencies including nectar thick do aid in improved esophageal clearance but increase aspiration risk. Will initiate a conservative diet and f/u at bedside for trials of nectar thick liquid via tsp (not aspirated during the study but at risk) for hopeful upgrade.    Swallow Evaluation Recommendations       SLP Diet Recommendations: Dysphagia 1 (Puree) solids;Honey thick liquids   Liquid Administration via: Spoon   Medication Administration: Crushed with puree   Supervision: Patient able to self feed;Full supervision/cueing for compensatory strategies   Compensations: Slow rate;Small sips/bites;Multiple dry swallows after each bite/sip   Postural Changes: Seated upright at 90 degrees;Remain semi-upright after after feeds/meals (Comment)   Oral Care Recommendations: Oral care BID   Other Recommendations: Order thickener from pharmacy;Prohibited food (jello, ice cream, thin soups);Remove water pitcher   [name] MA,  CCC-SLP   [name] 10/15/2017,9:38 AM

## 2017-10-15 NOTE — Progress Notes (Signed)
  Echocardiogram 2D Echocardiogram has been performed.  Krystal White 10/15/2017, 3:26 PM

## 2017-10-15 NOTE — Progress Notes (Signed)
Carotid duplex prelim: 1-39% ICA stenosis.  Celisse Ciulla Eunice, RDMS, RVT   

## 2017-10-15 NOTE — Progress Notes (Signed)
Patient had some red blood when changing incontinent episode.  RN will continue to monitor patient.

## 2017-10-15 NOTE — Evaluation (Signed)
Physical Therapy Evaluation Patient Details Name: Krystal White MRN: [id number] DOB: July 30, 1924 Today's Date: 10/15/2017   History of Present Illness  Krystal White is a 84 y.o. female with medical history significant of Atrial fibrillation, CHF, Hypertension and dementia who developed sudden onset of slurred speech and weakness   Clinical Impression  Patient presents with decreased independence with mobility due to deficits listed in PT problem list.  Currently she needs max A of 2 for transfers and very limited steps.  Feel she will need SNF level rehab at d/c.  PT to follow acutely.    Follow Up Recommendations SNF;Supervision/Assistance - 24 hour    Equipment Recommendations  None recommended by PT    Recommendations for Other Services       Precautions / Restrictions Precautions Precautions: Fall Precaution Comments: R weakness      Mobility  Bed Mobility               General bed mobility comments: up in chair  Transfers Overall transfer level: Needs assistance Equipment used: Rolling walker (2 wheeled);2 person hand held assist Transfers: Sit to/from Stand Sit to Stand: +2 physical assistance;Max assist         General transfer comment: cues and assist for R LE extension; trunk extension and for R hand placement on walker; initially with bilat HHA, then with walker due to pt familiarity  Ambulation/Gait Ambulation/Gait assistance: Max assist;+2 physical assistance Gait Distance (Feet): (no appreciable distance) Assistive device: Rolling walker (2 wheeled)       General Gait Details: leaning R with R hip/knee flexion; assist for R LE stabilization and able to move L foot forward minimally, did progress R LE x 1 as well.  Stairs            Wheelchair Mobility    Modified Rankin (Stroke Patients Only) Modified Rankin (Stroke Patients Only) Pre-Morbid Rankin Score: Moderate disability Modified Rankin: Severe disability     Balance  Overall balance assessment: Needs assistance Sitting-balance support: Feet supported Sitting balance-Leahy Scale: Poor Sitting balance - Comments: R trunk collapsing when sitting edge of chair     Standing balance-Leahy Scale: Zero Standing balance comment: max A of 2 for standing static                             Pertinent Vitals/Pain Pain Assessment: No/denies pain    Home Living Family/patient expects to be discharged to:: Skilled nursing facility                 Additional Comments: was at ALF per chart; patient relates she is from home    Prior Function                 Hand Dominance        Extremity/Trunk Assessment   Upper Extremity Assessment Upper Extremity Assessment: Defer to OT evaluation    Lower Extremity Assessment Lower Extremity Assessment: RLE deficits/detail RLE Deficits / Details: AAROM WFL; strength grossly 2/5    Cervical / Trunk Assessment Cervical / Trunk Assessment: Kyphotic  Communication   Communication: HOH;Other (comment)(dysarthria)  Cognition Arousal/Alertness: Awake/alert Behavior During Therapy: WFL for tasks assessed/performed Overall Cognitive Status: Impaired/Different from baseline Area of Impairment: Orientation;Safety/judgement                 Orientation Level: Disoriented to;Place(did not assess time and situation)       Safety/Judgement: Decreased awareness of safety;Decreased awareness of  deficits            General Comments General comments (skin integrity, edema, etc.): Nursing reports got her up to chair with basic squat pivot to L to drop arm recliner with +2 A    Exercises     Assessment/Plan    PT Assessment Patient needs continued PT services  PT Problem List Decreased balance;Decreased knowledge of use of DME;Decreased strength;Decreased mobility;Decreased safety awareness;Decreased activity tolerance       PT Treatment Interventions DME instruction;Therapeutic  activities;Cognitive remediation;Gait training;Therapeutic exercise;Patient/family education;Balance training;Functional mobility training    PT Goals (Current goals can be found in the Care Plan section)  Acute Rehab PT Goals Patient Stated Goal: none stated PT Goal Formulation: Patient unable to participate in goal setting Time For Goal Achievement: 10/29/17 Potential to Achieve Goals: Fair    Frequency Min 3X/week   Barriers to discharge        Co-evaluation PT/OT/SLP Co-Evaluation/Treatment: Yes Reason for Co-Treatment: For patient/therapist safety;To address functional/ADL transfers PT goals addressed during session: Mobility/safety with mobility;Balance;Proper use of DME;Strengthening/ROM         AM-PAC PT "6 Clicks" Daily Activity  Outcome Measure Difficulty turning over in bed (including adjusting bedclothes, sheets and blankets)?: Unable Difficulty moving from lying on back to sitting on the side of the bed? : Unable Difficulty sitting down on and standing up from a chair with arms (e.g., wheelchair, bedside commode, etc,.)?: Unable Help needed moving to and from a bed to chair (including a wheelchair)?: A Lot Help needed walking in hospital room?: Total Help needed climbing 3-5 steps with a railing? : Total 6 Click Score: 7    End of Session Equipment Utilized During Treatment: Gait belt Activity Tolerance: Patient limited by fatigue Patient left: with chair alarm set;in chair;with call bell/phone within reach   PT Visit Diagnosis: Hemiplegia and hemiparesis Hemiplegia - Right/Left: Left Hemiplegia - dominant/non-dominant: Non-dominant Hemiplegia - caused by: Cerebral infarction    Time: 9622-2979 PT Time Calculation (min) (ACUTE ONLY): 14 min   Charges:   PT Evaluation $PT Eval High Complexity: [address]          [place], [place] [phone] 10/15/2017   Krystal White 10/15/2017, 12:52 PM

## 2017-10-16 LAB — LIPID PANEL
CHOLESTEROL: 198 mg/dL (ref 0–200)
HDL: 59 mg/dL (ref >40)
LDL Cholesterol: 125 mg/dL — ABNORMAL HIGH (ref 0–99)
Total CHOL/HDL Ratio: 3.4 RATIO
Triglycerides: 69 mg/dL (ref <150)
VLDL: 14 mg/dL (ref 0–40)

## 2017-10-16 LAB — HEMOGLOBIN A1C
Hgb A1c MFr Bld: 5.4 % (ref 4.8–5.6)
MEAN PLASMA GLUCOSE: 108.28 mg/dL

## 2017-10-16 MED ORDER — SIMVASTATIN 40 MG PO TABS
40.0000 mg | ORAL_TABLET | Freq: Every day | ORAL | Status: DC
  Administered 2017-10-16: 40 mg via ORAL
  Filled 2017-10-16: qty 1

## 2017-10-16 MED ORDER — SIMVASTATIN 40 MG PO TABS: 40.0000 mg | ORAL_TABLET | Freq: Every day | ORAL | 0 refills | Status: DC

## 2017-10-16 NOTE — Discharge Instructions (Signed)

## 2017-10-16 NOTE — Evaluation (Signed)
Speech Language Pathology Evaluation Patient Details Name: Krystal White MRN: [id number] DOB: 06-11-1924 Today's Date: 10/16/2017 Time: 1211-1225 SLP Time Calculation (min) (ACUTE ONLY): 14 min  Problem List:  Patient Active Problem List   Diagnosis Date Noted  . CVA (cerebral vascular accident) ([place]) 10/14/2017  . Protein-calorie malnutrition ([place]) 04/28/2017  . Vascular dementia 03/26/2017  . CHF (congestive heart failure) ([place]) 03/02/2017  . Chronic diastolic heart failure ([place]) 02/13/2017  . HTN (hypertension) 02/13/2017  . Atrial fibrillation with RVR ([place]) 02/02/2017   Past Medical History:  Past Medical History:  Diagnosis Date  . Allergic rhinitis   . Anxiety    unspecified  . Back pain    unspecified  . Cystitis   . Edema   . Gait abnormality   . Hand pain   . Hearing loss    left ear  . Hematuria   . History of atrial fibrillation   . Hyperlipidemia, unspecified   . Hypertension   . Osteoporosis   . Osteoporosis, post-menopausal   . PAF (paroxysmal atrial fibrillation) ([place])   . Polymyalgia ([place])   . Pulmonary hypertension ([place])   . Skin cancer    Past Surgical History:  Past Surgical History:  Procedure Laterality Date  . BREAST BIOPSY    . INGUINAL HERNIA REPAIR Right 2005  . KYPHOPLASTY N/A 04/17/2017   Procedure: [id number];  Surgeon: Hessie Knows, MD;  Location: [facility] ORS;  Service: Orthopedics;  Laterality: N/A;   HPI:  Krystal White is a 84 y.o. female with medical history significant of Atrial fibrillation, CHF, Hypertension and dementia who developed sudden onset of slurred speech and weakness.  MRI with acute nonhemorrhagic infarcts involving the right inferior temporal gyrus and right superior cerebellum. Acute/subacute nonhemorrhagic infarct involving the anterior left pons.   Assessment / Plan / Recommendation Clinical Impression   Patient presents with significant cognitive-linguistic impairments including dysarthria  resulting from severe facial and lingual weakness. Pt with minimal verbal output or attempts, though when she does speak her voice is hoarse-breathy and low vocal intensity. Difficult to assess auditory comprehension as pt is very hard of hearing. Visual cues required for one-step commands. Pt responds to simple biographical and environmental/contextual questions by nodding Y/N. She was able to indicate what she would like to eat by gesturing. RN reports pt is more verbally communicative in the evenings. Pt attempted 2 sentences during the evaluation, 1 of which was intelligible to SLP: "You have to talk loud." Automatic speech tasks were attempted, and pt articulated "one" without phonation, and then shook her head, unable to confirm her baseline. Recommend skilled ST to address the above impairments, maximize pt's functional communication/ability to communicate wants and needs, and improve quality of life.    SLP Assessment  SLP Recommendation/Assessment: Patient needs continued Speech Lanaguage Pathology Services SLP Visit Diagnosis: Dysarthria and anarthria (R47.1);Cognitive communication deficit (R41.841);Aphasia (R47.01)    Follow Up Recommendations  Skilled Nursing facility    Frequency and Duration min 3x week  2 weeks      SLP Evaluation Cognition  Overall Cognitive Status: Impaired/Different from baseline Arousal/Alertness: Awake/alert Orientation Level: Oriented to person(oriented to month) Attention: Sustained Sustained Attention: Appears intact Memory: (UTA) Problem Solving: (UTA)       Comprehension  Auditory Comprehension Overall Auditory Comprehension: Other (comment)(Difficult to assess as pt is very HOH) Yes/No Questions: Within Functional Limits Commands: Impaired One Step Basic Commands: 50-74% accurate(with visual cues) Conversation: Simple Interfering Components: Hearing EffectiveTechniques: Visual/Gestural cues;Increased volume Visual  Recognition/Discrimination Discrimination:  Exceptions to [facility] Pictures: Able in field of 3 Reading Comprehension Reading Status: Not tested    Expression Expression Primary Mode of Expression: Verbal Verbal Expression Overall Verbal Expression: Impaired Initiation: Impaired Automatic Speech: Counting("one" only) Level of Generative/Spontaneous Verbalization: Sentence("you have to talk loud") Repetition: Impaired Level of Impairment: Word level Naming: Impairment Confrontation: Impaired(0%) Pictures: Able in field of 3 Pragmatics: No impairment Non-Verbal Means of Communication: Gestures;Eye gaze Written Expression Dominant Hand: Right Written Expression: Not tested   Oral / Motor  Oral Motor/Sensory Function Overall Oral Motor/Sensory Function: Severe impairment Facial ROM: Reduced right;Suspected CN VII (facial) dysfunction Facial Symmetry: Abnormal symmetry right;Suspected CN VII (facial) dysfunction Facial Strength: Reduced right;Suspected CN VII (facial) dysfunction Facial Sensation: Within Functional Limits Lingual ROM: Reduced right;Reduced left;Suspected CN XII (hypoglossal) dysfunction Lingual Symmetry: Abnormal symmetry right;Suspected CN XII (hypoglossal) dysfunction Lingual Strength: Suspected CN XII (hypoglossal) dysfunction;Reduced Lingual Sensation: Within Functional Limits Velum: Within Functional Limits Mandible: Suspected CN V (Trigeminal) dysfunction;Impaired Motor Speech Overall Motor Speech: Impaired Respiration: Within functional limits Phonation: Low vocal intensity;Hoarse Resonance: Within functional limits Articulation: Impaired Level of Impairment: Word Intelligibility: Intelligibility reduced Word: 50-74% accurate Sentence: 25-49% accurate Motor Planning: (UTA) Interfering Components: Hearing loss   GO                    Krystal White 10/16/2017, 1:36 PM   Deneise Lever, [place], [place] Pathologist [phone]

## 2017-10-16 NOTE — Clinical Social Work Placement (Signed)
Nurse to call report to [phone], Room [place]  NOTE  Date:  10/16/2017  Patient Details  Name: Krystal White MRN: [id number] Date of Birth: [birth year]  Clinical Social Work is seeking post-discharge placement for this patient at the [place] level of care (*CSW will initial, date and re-position this form in  chart as items are completed):  Yes   Patient/family provided with [place] Work Department's list of facilities offering this level of care within the geographic area requested by the patient (or if unable, by the patient's family).  Yes   Patient/family informed of their freedom to choose among providers that offer the needed level of care, that participate in Medicare, Medicaid or managed care program needed by the patient, have an available bed and are willing to accept the patient.  Yes   Patient/family informed of [facility]'s ownership interest in [facility] and [facility], as well as of the fact that they are under no obligation to receive care at these facilities.  PASRR submitted to EDS on 10/15/17     PASRR number received on 10/15/17     Existing PASRR number confirmed on       FL2 transmitted to all facilities in geographic area requested by pt/family on 10/15/17     FL2 transmitted to all facilities within larger geographic area on       Patient informed that his/her managed care company has contracts with or will negotiate with certain facilities, including the following:        Yes   Patient/family informed of bed offers received.  Patient chooses bed at [facility]     Physician recommends and patient chooses bed at      Patient to be transferred to [facility] on 10/16/17.  Patient to be transferred to facility by [facility]     Patient family notified on 10/16/17 of transfer.  Name of family member notified:  Fonnie Mu     PHYSICIAN        Additional Comment:    _______________________________________________ Geralynn Ochs, LCSW 10/16/2017, 4:48 PM

## 2017-10-16 NOTE — Discharge Summary (Signed)
Physician Discharge Summary  TZIVIA ONEIL WYO:[id number] DOB: 12/16/1924 DOA: 10/14/2017  PCP: Derinda Late, MD  Admit date: 10/14/2017 Discharge date: 10/16/2017  Time spent: 40 minutes  Recommendations for Outpatient Follow-up:  1. Follow outpatient CBC/CMP 2. Ensure outpatient neurology follow up 3. Pt needs f/u with ENT for cholesteotoma  4. Follow up mod aortic stenosis as outpatient 5. Please have speech therapy continue to follow as outpatient (as well as PT/OT)  Discharge Diagnoses:  Active Problems:   CVA (cerebral vascular accident) [facility])   Discharge Condition: stable  Diet recommendation: heart healthy (see modified diet per speech below)  There were no vitals filed for this visit.  History of present illness:  84 yo F with hx of atrial fibrillation, HTN, HLD, asthma, and multiple other medical problems who presented with slurred speech and weakness, leaning to the right and found to have an acute stroke.  Neurology was c/s and recommended continue eliquis and increase simvastatin to 40 mg daily.  She was discharged to SNF on 8/23.   Hospital Course:  Acute CVA - Acute nonhemorrhagic infarct of Right Inferior Temporal Gyrus and Right Superior Cerebellum as well as acute/aubacute nonhemorrhagic infarct of the anterior left pons (likely major cortical spinal tracts).    Carotid dopplers with 1-39% stenosis in R and L carotid, bilateral vertebrals with antegrade flow Echo with EF 65-70%, moderate AS and mild AI PT/OT recommending SNF SLP recommending  Dysphagia 1 diet with honey thick liquids A1c 5.4, LDL 125 and HDL 59 Simvastatin increased to 40 mg Neurology c/s, appreciate recommendations -> thought embolic 2/2 afib on eliquis.  Recommending continue eliquis, increase simvastatin to 40 mg.  Per neurology, suspect qol may be impaired with her recent strokes.   Cholesteatoma: follow up outpatient with ENT  Paroxysmal A. fib - continue amiodarone, metoprolol,  and eliquis   Moderate aortic stenosis on echo 8/22 - will need to be f/u as outpatient   Hyperlipidemia increase simvastatin to 40 mg daily   Dysphagia-dysphagia 1 diet, honey thick liquids, SLP should follow at SNF  Chronic kidney disease stage I-II-creatinine seems to be at baseline, follow outpatient  Mild vascular dementia  Procedures: Echo Study Conclusions  - Left ventricle: The cavity size was normal. Wall thickness was   normal. Systolic function was vigorous. The estimated ejection   fraction was in the range of 65% to 70%. Wall motion was normal;   there were no regional wall motion abnormalities. Left   ventricular diastolic function parameters were normal. - Aortic valve: Calcified leaflets. Moderate stenosis, mild   regurgitation. Mean gradient (S): 13 mm Hg. Peak gradient (S): 23   mm Hg. Valve area (VTI): 1.11 cm^2. Valve area (Vmax): 1.08 cm^2.   Valve area (Vmean): 1.03 cm^2. - Mitral valve: Calcified annulus. Mildly thickened leaflets .   There was trivial regurgitation. - Left atrium: The atrium was normal in size. - Inferior vena cava: The vessel was normal in size. The   respirophasic diameter changes were in the normal range (>= 50%),   consistent with normal central venous pressure.  Impressions:  - Compared to a study in 01/2017, the LVEF is higher at 65-70%,   There is now moderate aortic stenosis and mild AI with a mean   gradient of 13 mmHg.  Final Interpretation: Right Carotid: Velocities in the right ICA are consistent with a 1-39% stenosis.  Left Carotid: Velocities in the left ICA are consistent with a 1-39% stenosis.  Vertebrals: Bilateral vertebral arteries demonstrate  antegrade flow. Subclavians: Normal flow hemodynamics were seen in bilateral subclavian       arteries.  Consultations:  neurology  Discharge Exam: Vitals:   10/16/17 0732 10/16/17 1233  BP: (!) 160/59 (!) 138/59  Pulse: 69 73  Resp: 18 18  Temp:  98.2 F (36.8 C) 99.2 F (37.3 C)  SpO2: 97% 97%   Difficult to understand due to dysarthria. Hard of hearing as well. Responds appropriately with nodding/shaking head.  Sister and church member at bedside. Called legal guardian, but unable to reach and no answer.  They called back and I updated on plan to d/c to SNF. Called son at listed number and no answer.  Left msg.  General: No acute distress. Cardiovascular: Heart sounds show a regular rate, and rhythm. No gallops or rubs. No murmurs. No JVD. Lungs: Clear to auscultation bilaterally Abdomen: Soft, nontender, nondistended with normal active bowel sounds. No masses. No hepatosplenomegaly. Neurological: Alert and unable to assess orientation due to dysarthria.  R sided weakness.    Skin: Warm and dry. No rashes or lesions. Extremities: No clubbing or cyanosis. No edema.   Discharge Instructions   Discharge Instructions    Ambulatory referral to Neurology   Complete by:  As directed    An appointment is requested in approximately: 2 weeks   Call MD for:  difficulty breathing, headache or visual disturbances   Complete by:  As directed    Call MD for:  extreme fatigue   Complete by:  As directed    Call MD for:  persistant dizziness or light-headedness   Complete by:  As directed    Call MD for:  persistant nausea and vomiting   Complete by:  As directed    Call MD for:  redness, tenderness, or signs of infection (pain, swelling, redness, odor or green/yellow discharge around incision site)   Complete by:  As directed    Call MD for:  severe uncontrolled pain   Complete by:  As directed    Call MD for:  temperature >100.4   Complete by:  As directed    Discharge diet:   Complete by:  As directed    Diet recommendations: Dysphagia 1 (puree);Honey-thick liquid Liquids provided via: Teaspoon Medication Administration: Crushed with puree Supervision: Full supervision/cueing for compensatory strategies Compensations: Slow  rate;Small sips/bites;Multiple dry swallows after each bite/sip Postural Changes and/or Swallow Maneuvers: Seated upright 90 degrees;Upright 30-60 min after meal Oral Care Recommendations: Oral care BID Follow up Recommendations: Skilled Nursing facility SLP Visit Diagnosis: Dysphagia, oropharyngeal phase (R13.12) Plan: Continue with current plan of care   Discharge instructions   Complete by:  As directed    You were seen for a stroke.  You were seen by neurology who recommended continuing eliquis and increasing your simvastatin to 40 mg daily.  You have a cholesteatoma, this needs to be followed up with ENT as an outpatient.  Return for new, recurrent, or worsening symptoms.  Please ask your PCP to request records from this hospitalization so they know what was done and what the next steps will be.   Increase activity slowly   Complete by:  As directed      Allergies as of 10/16/2017   No Known Allergies     Medication List    TAKE these medications   albuterol 108 (90 Base) MCG/ACT inhaler Commonly known as:  PROVENTIL HFA;VENTOLIN HFA Inhale 2 puffs into the lungs every 6 (six) hours as needed for wheezing or shortness of  breath.   amiodarone 200 MG tablet Commonly known as:  PACERONE Take 1 tablet (200 mg total) by mouth daily.   apixaban 2.5 MG Tabs tablet Commonly known as:  ELIQUIS Take 1 tablet (2.5 mg total) by mouth 2 (two) times daily.   azelastine 0.1 % nasal spray Commonly known as:  ASTELIN Place 2 sprays into both nostrils 2 (two) times daily. Use in each nostril as directed   budesonide-formoterol 160-4.5 MCG/ACT inhaler Commonly known as:  SYMBICORT Inhale 2 puffs into the lungs 2 (two) times daily.   carboxymethylcellulose 0.5 % Soln Commonly known as:  REFRESH PLUS Place 1 drop into both eyes 2 (two) times daily.   donepezil 10 MG tablet Commonly known as:  ARICEPT Take 10 mg by mouth at bedtime.   feeding supplement (PRO-STAT SUGAR FREE 64)  Liqd Take 30 mLs by mouth 2 (two) times daily between meals.   fluticasone 50 MCG/ACT nasal spray Commonly known as:  FLONASE Place 2 sprays into the nose daily.   furosemide 40 MG tablet Commonly known as:  LASIX Take 1 tablet (40 mg total) by mouth daily.   metoprolol tartrate 25 MG tablet Commonly known as:  LOPRESSOR Take 0.5 tablets (12.5 mg total) by mouth 2 (two) times daily. What changed:  how much to take   potassium chloride 10 MEQ CR capsule Commonly known as:  MICRO-K Take 10 mEq by mouth daily.   simvastatin 40 MG tablet Commonly known as:  ZOCOR Take 1 tablet (40 mg total) by mouth daily at 6 PM. What changed:    medication strength  how much to take  when to take this      No Known Allergies  Contact information for follow-up providers    Derinda Late, MD Follow up.   Specialty:  Family Medicine Contact information: [address] [facility] [place] [address] [phone]        Wellington Hampshire, MD .   Specialty:  Cardiology Contact information: [place] [address] [phone]        [facility] Follow up.   Specialty:  Neurology Why:  Please call for an appointment if you don't hear within next 1-2 weeks Contact information: [place] [phone]           Contact information for after-discharge care    Destination    [facility] Preferred SNF .   Service:  Skilled Nursing Contact information: [address] [place] [phone]                   The results of significant diagnostics from this hospitalization (including imaging, microbiology, ancillary and laboratory) are listed below for reference.    Significant Diagnostic Studies: Mr Virgel Paling CW Contrast  Addendum Date: 10/14/2017   ADDENDUM REPORT: 10/14/2017 14:16  ADDENDUM: Restricted diffusion within soft tissue of the left external auditory canal is highly concerning for an EAC cholesteatoma. Electronically Signed   By: San Morelle M.D.   On: 10/14/2017 14:16   Result Date: 10/14/2017 CLINICAL DATA:  Slurred speech. Right-sided weakness. Symptoms began 4 hours ago. Leaning to the right while walking. EXAM: MRI HEAD WITHOUT CONTRAST MRA HEAD WITHOUT CONTRAST TECHNIQUE: Multiplanar, multiecho pulse sequences of the brain and surrounding structures were obtained without intravenous contrast. Angiographic images of the head were obtained using MRA technique without contrast.  COMPARISON:  CT head without contrast 10/14/2017. FINDINGS: MRI HEAD FINDINGS Brain: Acute nonhemorrhagic infarct is confirmed within the right inferior temporal gyrus. Infarct measures up to 1.7 cm in maximal dimension. A subacute infarct in the anterior left pons appears slightly older. A punctate infarct in the superior right cerebellum is acute. A punctate posterior right frontal lobe infarct is present on image 36 of series 3. This appears acute. Diffuse white matter changes are present. Focal T2 signal changes are associated with the areas of restricted diffusion mentioned above. More remote right cerebellar infarcts are present as well. Dilated perivascular spaces are present throughout the basal ganglia. The ventricles are proportionate to the degree of atrophy. No significant extra-axial fluid collection is present. Vascular: Flow is present in the major intracranial arteries. Skull and upper cervical spine: The skull base is within normal limits. Craniocervical junction is normal. Sinuses/Orbits: The paranasal sinuses and mastoid air cells are clear. Bilateral lens replacements are present. Globes and orbits are otherwise within normal limits. MRA HEAD FINDINGS The internal carotid arteries are within normal limits from the high cervical segments through the ICA termini bilaterally. The  left A1 segment is dominant. The anterior communicating artery is patent. MCA bifurcations are normal. ACA and MCA branch vessels are within normal limits. The left vertebral artery is dominant. PICA origins are visualized and normal. The left posterior cerebral artery originates from a normal basilar tip. The right posterior cerebral artery is of fetal type. A small right P1 segment and infundibulum is present. There is no aneurysm, significant proximal stenosis, or branch vessel occlusion. IMPRESSION: 1. Acute nonhemorrhagic infarcts involving the right inferior temporal gyrus and right superior cerebellum. 2. Acute/subacute nonhemorrhagic infarct involving the anterior left pons, likely major cortical spinal tracts. 3. Diffuse white matter disease likely reflects the sequela of chronic microvascular ischemia. 4. Normal variant MRA circle-of-Willis without significant proximal stenosis, aneurysm, or branch vessel occlusion. Electronically Signed: By: San Morelle M.D. On: 10/14/2017 13:37   Mr Brain Wo Contrast  Addendum Date: 10/14/2017   ADDENDUM REPORT: 10/14/2017 14:16 ADDENDUM: Restricted diffusion within soft tissue of the left external auditory canal is highly concerning for an EAC cholesteatoma. Electronically Signed   By: San Morelle M.D.   On: 10/14/2017 14:16   Result Date: 10/14/2017 CLINICAL DATA:  Slurred speech. Right-sided weakness. Symptoms began 4 hours ago. Leaning to the right while walking. EXAM: MRI HEAD WITHOUT CONTRAST MRA HEAD WITHOUT CONTRAST TECHNIQUE: Multiplanar, multiecho pulse sequences of the brain and surrounding structures were obtained without intravenous contrast. Angiographic images of the head were obtained using MRA technique without contrast. COMPARISON:  CT head without contrast 10/14/2017. FINDINGS: MRI HEAD FINDINGS Brain: Acute nonhemorrhagic infarct is confirmed within the right inferior temporal gyrus. Infarct measures up to 1.7 cm in maximal  dimension. A subacute infarct in the anterior left pons appears slightly older. A punctate infarct in the superior right cerebellum is acute. A punctate posterior right frontal lobe infarct is present on image 36 of series 3. This appears acute. Diffuse white matter changes are present. Focal T2 signal changes are associated with the areas of restricted diffusion mentioned above. More remote right cerebellar infarcts are present as well. Dilated perivascular spaces are present throughout the basal ganglia. The ventricles are proportionate to the degree of atrophy. No significant extra-axial fluid collection is present. Vascular: Flow is present in the major intracranial arteries. Skull and upper cervical spine: The skull base is within normal limits. Craniocervical junction is normal. Sinuses/Orbits: The  paranasal sinuses and mastoid air cells are clear. Bilateral lens replacements are present. Globes and orbits are otherwise within normal limits. MRA HEAD FINDINGS The internal carotid arteries are within normal limits from the high cervical segments through the ICA termini bilaterally. The left A1 segment is dominant. The anterior communicating artery is patent. MCA bifurcations are normal. ACA and MCA branch vessels are within normal limits. The left vertebral artery is dominant. PICA origins are visualized and normal. The left posterior cerebral artery originates from a normal basilar tip. The right posterior cerebral artery is of fetal type. A small right P1 segment and infundibulum is present. There is no aneurysm, significant proximal stenosis, or branch vessel occlusion. IMPRESSION: 1. Acute nonhemorrhagic infarcts involving the right inferior temporal gyrus and right superior cerebellum. 2. Acute/subacute nonhemorrhagic infarct involving the anterior left pons, likely major cortical spinal tracts. 3. Diffuse white matter disease likely reflects the sequela of chronic microvascular ischemia. 4. Normal variant  MRA circle-of-Willis without significant proximal stenosis, aneurysm, or branch vessel occlusion. Electronically Signed: By: San Morelle M.D. On: 10/14/2017 13:37   Dg Swallowing Func-speech Pathology  Result Date: 10/15/2017 Objective Swallowing Evaluation: Type of Study: MBS-Modified Barium Swallow Study  Patient Details Name: TAMANA HATFIELD MRN: [id number] Date of Birth: 10-06-1924 Today's Date: 10/15/2017 Time: SLP Start Time (ACUTE ONLY): 0858 -SLP Stop Time (ACUTE ONLY): 0915 SLP Time Calculation (min) (ACUTE ONLY): 17 min Past Medical History: Past Medical History: Diagnosis Date . Allergic rhinitis  . Anxiety   unspecified . Back pain   unspecified . Cystitis  . Edema  . Gait abnormality  . Hand pain  . Hearing loss   left ear . Hematuria  . History of atrial fibrillation  . Hyperlipidemia, unspecified  . Hypertension  . Osteoporosis  . Osteoporosis, post-menopausal  . PAF (paroxysmal atrial fibrillation) ([place])  . Polymyalgia ([place])  . Pulmonary hypertension ([place])  . Skin cancer  Past Surgical History: Past Surgical History: Procedure Laterality Date . BREAST BIOPSY   . INGUINAL HERNIA REPAIR Right 2005 . KYPHOPLASTY N/A 04/17/2017  Procedure: [id number];  Surgeon: Hessie Knows, MD;  Location: [facility] ORS;  Service: Orthopedics;  Laterality: N/A; HPI: SHRILEY JOFFE is a 84 y.o. female with medical history significant of Atrial fibrillation, CHF, Hypertension and dementia who developed sudden onset of slurred speech and weakness.  MRI with acute nonhemorrhagic infarcts involving the right inferior  No data recorded Assessment / Plan / Recommendation CHL IP CLINICAL IMPRESSIONS 10/15/2017 Clinical Impression Patient presents with a severe oral dysphagia with secondary pharyngeal and esophageal components. CN deficits result in severe oral weakness, primarily right sided, with poor labial seal and lingual movement/ROM. Significant anterior labial spillage of all boluses noted, improving but  not eliminated with use of tsp presentation. Loss of bolus over the base of tongue with delayed swallow initiation noted across consistencies with resultant deep penetration and silent aspiration of thin liquids intermittently. Additionally, patient with severe stasis through out esophagus with retrograde flow of bolus into the pyriform sinuses post swallow. Note that thinner consistencies including nectar thick do aid in improved esophageal clearance but increase aspiration risk. Will initiate a conservative diet and f/u at bedside for trials of nectar thick liquid via tsp (not aspirated during the study but at risk) for hopeful upgrade.  SLP Visit Diagnosis Dysphagia, oropharyngeal phase (R13.12) Attention and concentration deficit following -- Frontal lobe and executive function deficit following -- Impact on safety and function Moderate aspiration risk   CHL IP TREATMENT  RECOMMENDATION 10/15/2017 Treatment Recommendations Therapy as outlined in treatment plan below   Prognosis 10/15/2017 Prognosis for Safe Diet Advancement Good Barriers to Reach Goals Severity of deficits Barriers/Prognosis Comment -- CHL IP DIET RECOMMENDATION 10/15/2017 SLP Diet Recommendations Dysphagia 1 (Puree) solids;Honey thick liquids Liquid Administration via Spoon Medication Administration Crushed with puree Compensations Slow rate;Small sips/bites;Multiple dry swallows after each bite/sip Postural Changes Seated upright at 90 degrees;Remain semi-upright after after feeds/meals (Comment)   CHL IP OTHER RECOMMENDATIONS 10/15/2017 Recommended Consults -- Oral Care Recommendations Oral care BID Other Recommendations Order thickener from pharmacy;Prohibited food (jello, ice cream, thin soups);Remove water pitcher   CHL IP FOLLOW UP RECOMMENDATIONS 10/15/2017 Follow up Recommendations Skilled Nursing facility   [facility] IP FREQUENCY AND DURATION 10/15/2017 Speech Therapy Frequency (ACUTE ONLY) min 3x week Treatment Duration 2 weeks      CHL IP ORAL  PHASE 10/15/2017 Oral Phase Impaired Oral - Pudding Teaspoon -- Oral - Pudding Cup -- Oral - Honey Teaspoon Right anterior bolus loss;Lingual pumping;Delayed oral transit;Decreased bolus cohesion Oral - Honey Cup Right anterior bolus loss;Lingual pumping;Delayed oral transit;Decreased bolus cohesion Oral - Nectar Teaspoon Right anterior bolus loss;Lingual pumping;Delayed oral transit;Decreased bolus cohesion Oral - Nectar Cup Right anterior bolus loss;Lingual pumping;Delayed oral transit;Decreased bolus cohesion Oral - Nectar Straw -- Oral - Thin Teaspoon Right anterior bolus loss;Lingual pumping;Delayed oral transit;Decreased bolus cohesion Oral - Thin Cup Right anterior bolus loss;Lingual pumping;Delayed oral transit;Decreased bolus cohesion Oral - Thin Straw Right anterior bolus loss;Lingual pumping;Delayed oral transit;Decreased bolus cohesion Oral - Puree Right anterior bolus loss;Lingual pumping;Delayed oral transit;Decreased bolus cohesion Oral - Mech Soft -- Oral - Regular -- Oral - Multi-Consistency -- Oral - Pill -- Oral Phase - Comment --  CHL IP PHARYNGEAL PHASE 10/15/2017 Pharyngeal Phase Impaired Pharyngeal- Pudding Teaspoon -- Pharyngeal -- Pharyngeal- Pudding Cup -- Pharyngeal -- Pharyngeal- Honey Teaspoon Delayed swallow initiation-pyriform sinuses;Delayed swallow initiation-vallecula Pharyngeal -- Pharyngeal- Honey Cup Delayed swallow initiation-vallecula;Delayed swallow initiation-pyriform sinuses Pharyngeal -- Pharyngeal- Nectar Teaspoon Delayed swallow initiation-pyriform sinuses Pharyngeal -- Pharyngeal- Nectar Cup Delayed swallow initiation-pyriform sinuses;Penetration/Apiration after swallow Pharyngeal Material enters airway, CONTACTS cords and then ejected out Pharyngeal- Nectar Straw -- Pharyngeal -- Pharyngeal- Thin Teaspoon Delayed swallow initiation-pyriform sinuses;Penetration/Aspiration during swallow Pharyngeal Material enters airway, CONTACTS cords and not ejected out Pharyngeal- Thin  Cup Penetration/Aspiration during swallow Pharyngeal Material enters airway, passes BELOW cords without attempt by patient to eject out (silent aspiration) Pharyngeal- Thin Straw Penetration/Aspiration during swallow;Delayed swallow initiation-pyriform sinuses Pharyngeal Material enters airway, passes BELOW cords without attempt by patient to eject out (silent aspiration) Pharyngeal- Puree Delayed swallow initiation-vallecula Pharyngeal -- Pharyngeal- Mechanical Soft -- Pharyngeal -- Pharyngeal- Regular -- Pharyngeal -- Pharyngeal- Multi-consistency -- Pharyngeal -- Pharyngeal- Pill -- Pharyngeal -- Pharyngeal Comment --  CHL IP CERVICAL ESOPHAGEAL PHASE 10/15/2017 Cervical Esophageal Phase Impaired Pudding Teaspoon -- Pudding Cup -- Honey Teaspoon -- Honey Cup -- Nectar Teaspoon -- Nectar Cup -- Nectar Straw -- Thin Teaspoon -- Thin Cup -- Thin Straw -- Puree -- Mechanical Soft -- Regular -- Multi-consistency -- Pill -- Cervical Esophageal Comment see impression statement Gabriel Rainwater MA, CCC-SLP McCoy Leah Meryl 10/15/2017, 9:39 AM              Ct Head Code Stroke Wo Contrast  Result Date: 10/14/2017 CLINICAL DATA:  Code stroke. Sudden onset of slurred speech with right-sided weakness EXAM: CT HEAD WITHOUT CONTRAST TECHNIQUE: Contiguous axial images were obtained from the base of the skull through the vertex without intravenous contrast. COMPARISON:  08/19/2013 brain MRI  FINDINGS: Brain: Cytotoxic edema appearance without volume loss in the superficial and inferior right temporal lobe, MCA inferior division. No visible left-sided infarct. The patient is reportedly not a tPA candidate due to Eliquis use. Negative for acute hemorrhage. There is chronic small vessel ischemia in the cerebral white matter. Vascular: Atherosclerotic calcification.  No hyperdense vessel Skull: No acute finding.  Remote left orbital rim repair Sinuses/Orbits: No acute finding. Soft tissue density obliterates the left external auditory  canal and causes inward bulging of the tympanic membrane, with deficiency of left ossicles. The soft tissue is new from 2007 neck CT, although the ossicular deficiency is likely chronic from that time. Need visual inspection to differentiate cerumen from cholesteatoma. No canal erosion. Other: These results were called by telephone at the time of interpretation on 10/14/2017 at 9:48 am to Dr. Cheral Marker, who verbally acknowledged these results. ASPECTS [facility] Stroke Program Early CT Score) Not scored given the above. IMPRESSION: 1. Small recent appearing infarct in the superficial and inferior right temporal lobe. 2. No left-sided infarct is seen in this patient with right-sided weakness. 3. Soft tissue fills the left external auditory canal and there is left ossicular deficiency. Need visual inspection to differentiate cerumen from cholesteatoma. Electronically Signed   By: Monte Fantasia M.D.   On: 10/14/2017 09:53    Microbiology: No results found for this or any previous visit (from the past 240 hour(s)).   Labs: Basic Metabolic Panel: Recent Labs  Lab 10/14/17 1031 10/14/17 1041 10/15/17 0421  NA 140 139 140  K 4.0 3.9 3.6  CL 103 98 100  CO2 31  --  30  GLUCOSE 103* 99 95  BUN 24* 27* 20  CREATININE 1.19* 1.20* 1.09*  CALCIUM 8.9  --  8.6*   Liver Function Tests: Recent Labs  Lab 10/14/17 1031  AST 22  ALT 19  ALKPHOS 64  BILITOT 1.0  PROT 6.3*  ALBUMIN 3.2*   No results for input(s): LIPASE, AMYLASE in the last 168 hours. No results for input(s): AMMONIA in the last 168 hours. CBC: Recent Labs  Lab 10/14/17 1031 10/14/17 1041 10/15/17 0421  WBC 5.5  --  5.9  NEUTROABS 3.1  --   --   HGB 11.5* 12.2 11.8*  HCT 38.1 36.0 38.3  MCV 96.5  --  93.4  PLT 168  --  179   Cardiac Enzymes: No results for input(s): CKTOTAL, CKMB, CKMBINDEX, TROPONINI in the last 168 hours. BNP: BNP (last 3 results) Recent Labs    03/02/17 1619  BNP 1,577.0*    ProBNP (last 3  results) No results for input(s): PROBNP in the last 8760 hours.  CBG: No results for input(s): GLUCAP in the last 168 hours.     Signed:  Fayrene Helper MD.  [facility] 10/16/2017, 3:00 PM

## 2017-10-16 NOTE — Progress Notes (Signed)
  Speech Language Pathology Treatment: Dysphagia  Patient Details Name: Krystal White MRN: [id number] DOB: [birth year] Today's Date: 10/16/2017 Time: 3300-7622 SLP Time Calculation (min) (ACUTE ONLY): 25 min  Assessment / Plan / Recommendation Clinical Impression  Pt seen for follow-up for dysphagia for diet tolerance of dys 1, honey thick liquids. No family/caregivers present. RN reports pt tolerated breakfast well. Pt able to retrieve boluses of puree via spoon; tactile assist required primarily for small bites and slow rate. No overt signs of aspiration with puree or honey thick liquid initially, though after consuming approximately 6-8oz, pt had delayed throat clear x2, suspect due to reduced esophageal clearance. Would continue dys 1, honey thick liquids by teaspoon; SLP will follow up for advanced trials of nectar thick liquids.    HPI HPI: Krystal White is a 84 y.o. female with medical history significant of Atrial fibrillation, CHF, Hypertension and dementia who developed sudden onset of slurred speech and weakness.  MRI with acute nonhemorrhagic infarcts involving the right inferior temporal gyrus and right superior cerebellum. Acute/subacute nonhemorrhagic infarct involving the anterior left pons.      SLP Plan  Continue with current plan of care       Recommendations  Diet recommendations: Dysphagia 1 (puree);Honey-thick liquid Liquids provided via: Teaspoon Medication Administration: Crushed with puree Supervision: Full supervision/cueing for compensatory strategies Compensations: Slow rate;Small sips/bites;Multiple dry swallows after each bite/sip Postural Changes and/or Swallow Maneuvers: Seated upright 90 degrees;Upright 30-60 min after meal                Oral Care Recommendations: Oral care BID Follow up Recommendations: Skilled Nursing facility SLP Visit Diagnosis: Dysphagia, oropharyngeal phase (R13.12) Plan: Continue with current plan of care        [place], [place], [place] Pathologist [phone] Aliene Altes 10/16/2017, 1:18 PM

## 2017-10-16 NOTE — Progress Notes (Signed)
NURSING PROGRESS NOTE  Krystal White [id number] Discharge Data: 10/16/2017 7:12 PM Attending Provider: Elodia Florence., * CNO:[id number], Beverely Low, MD     Orlie Pollen to be D/C'd Skilled nursing facility ([place]) per MD order.  Report given to [facility]. Glasses, denture and hat at bedside . PTAR staffs aware. Belinda from facility aware of her belongings coming with pt.   Last Vital Signs:  Blood pressure (!) 139/50, pulse 74, temperature 98.6 F (37 C), temperature source Axillary, resp. rate 18, SpO2 97 %.  Discharge Medication List Allergies as of 10/16/2017   No Known Allergies     Medication List    TAKE these medications   albuterol 108 (90 Base) MCG/ACT inhaler Commonly known as:  PROVENTIL HFA;VENTOLIN HFA Inhale 2 puffs into the lungs every 6 (six) hours as needed for wheezing or shortness of breath.   amiodarone 200 MG tablet Commonly known as:  PACERONE Take 1 tablet (200 mg total) by mouth daily.   apixaban 2.5 MG Tabs tablet Commonly known as:  ELIQUIS Take 1 tablet (2.5 mg total) by mouth 2 (two) times daily.   azelastine 0.1 % nasal spray Commonly known as:  ASTELIN Place 2 sprays into both nostrils 2 (two) times daily. Use in each nostril as directed   budesonide-formoterol 160-4.5 MCG/ACT inhaler Commonly known as:  SYMBICORT Inhale 2 puffs into the lungs 2 (two) times daily.   carboxymethylcellulose 0.5 % Soln Commonly known as:  REFRESH PLUS Place 1 drop into both eyes 2 (two) times daily.   donepezil 10 MG tablet Commonly known as:  ARICEPT Take 10 mg by mouth at bedtime.   feeding supplement (PRO-STAT SUGAR FREE 64) Liqd Take 30 mLs by mouth 2 (two) times daily between meals.   fluticasone 50 MCG/ACT nasal spray Commonly known as:  FLONASE Place 2 sprays into the nose daily.   furosemide 40 MG tablet Commonly known as:  LASIX Take 1 tablet (40 mg total) by mouth daily.   metoprolol tartrate 25 MG tablet Commonly known  as:  LOPRESSOR Take 0.5 tablets (12.5 mg total) by mouth 2 (two) times daily. What changed:  how much to take   potassium chloride 10 MEQ CR capsule Commonly known as:  MICRO-K Take 10 mEq by mouth daily.   simvastatin 40 MG tablet Commonly known as:  ZOCOR Take 1 tablet (40 mg total) by mouth daily at 6 PM. What changed:    medication strength  how much to take  when to take this

## 2017-10-16 NOTE — Care Management Important Message (Signed)
Important Message  Patient Details  Name: MARGERIE FRAISER MRN: [id number] Date of Birth: [birth year]   Medicare Important Message Given:  No Patient is too ill to sign.  Japneet Staggs 10/16/2017, 3:07 PM

## 2017-10-16 NOTE — Progress Notes (Signed)
STROKE TEAM PROGRESS NOTE    INTERVAL HISTORY Her speech therapist is at the bedside.  He has been able to eat her diet but has not been speaking a lot. Therapist recommend SNIF for rehabilitation  Vitals:   10/15/17 2010 10/16/17 0011 10/16/17 0425 10/16/17 0732  BP: (!) 185/66 (!) 151/95 (!) 160/42 (!) 160/59  Pulse: 75 79 63 69  Resp: 16 17 17 18   Temp: 98.4 F (36.9 C) 97.9 F (36.6 C) 97.8 F (36.6 C) 98.2 F (36.8 C)  TempSrc: Oral Oral Oral Oral  SpO2: 96% 99% 96% 97%    CBC:  Recent Labs  Lab 10/14/17 1031 10/14/17 1041 10/15/17 0421  WBC 5.5  --  5.9  NEUTROABS 3.1  --   --   HGB 11.5* 12.2 11.8*  HCT 38.1 36.0 38.3  MCV 96.5  --  93.4  PLT 168  --  646    Basic Metabolic Panel:  Recent Labs  Lab 10/14/17 1031 10/14/17 1041 10/15/17 0421  NA 140 139 140  K 4.0 3.9 3.6  CL 103 98 100  CO2 31  --  30  GLUCOSE 103* 99 95  BUN 24* 27* 20  CREATININE 1.19* 1.20* 1.09*  CALCIUM 8.9  --  8.6*   Lipid Panel:     Component Value Date/Time   CHOL 198 10/16/2017 0416   TRIG 69 10/16/2017 0416   HDL 59 10/16/2017 0416   CHOLHDL 3.4 10/16/2017 0416   VLDL 14 10/16/2017 0416   LDLCALC 125 (H) 10/16/2017 0416   HgbA1c:  Lab Results  Component Value Date   HGBA1C 5.4 10/16/2017   Urine Drug Screen: No results found for: LABOPIA, COCAINSCRNUR, LABBENZ, AMPHETMU, THCU, LABBARB  Alcohol Level No results found for: [facility]  IMAGING Mr Jodene Nam Head Wo Contrast  Addendum Date: 10/14/2017   ADDENDUM REPORT: 10/14/2017 14:16 ADDENDUM: Restricted diffusion within soft tissue of the left external auditory canal is highly concerning for an EAC cholesteatoma. Electronically Signed   By: San Morelle M.D.   On: 10/14/2017 14:16   Result Date: 10/14/2017 CLINICAL DATA:  Slurred speech. Right-sided weakness. Symptoms began 4 hours ago. Leaning to the right while walking. EXAM: MRI HEAD WITHOUT CONTRAST MRA HEAD WITHOUT CONTRAST TECHNIQUE: Multiplanar, multiecho pulse  sequences of the brain and surrounding structures were obtained without intravenous contrast. Angiographic images of the head were obtained using MRA technique without contrast. COMPARISON:  CT head without contrast 10/14/2017. FINDINGS: MRI HEAD FINDINGS Brain: Acute nonhemorrhagic infarct is confirmed within the right inferior temporal gyrus. Infarct measures up to 1.7 cm in maximal dimension. A subacute infarct in the anterior left pons appears slightly older. A punctate infarct in the superior right cerebellum is acute. A punctate posterior right frontal lobe infarct is present on image 36 of series 3. This appears acute. Diffuse white matter changes are present. Focal T2 signal changes are associated with the areas of restricted diffusion mentioned above. More remote right cerebellar infarcts are present as well. Dilated perivascular spaces are present throughout the basal ganglia. The ventricles are proportionate to the degree of atrophy. No significant extra-axial fluid collection is present. Vascular: Flow is present in the major intracranial arteries. Skull and upper cervical spine: The skull base is within normal limits. Craniocervical junction is normal. Sinuses/Orbits: The paranasal sinuses and mastoid air cells are clear. Bilateral lens replacements are present. Globes and orbits are otherwise within normal limits. MRA HEAD FINDINGS The internal carotid arteries are within normal limits from the  high cervical segments through the ICA termini bilaterally. The left A1 segment is dominant. The anterior communicating artery is patent. MCA bifurcations are normal. ACA and MCA branch vessels are within normal limits. The left vertebral artery is dominant. PICA origins are visualized and normal. The left posterior cerebral artery originates from a normal basilar tip. The right posterior cerebral artery is of fetal type. A small right P1 segment and infundibulum is present. There is no aneurysm, significant  proximal stenosis, or branch vessel occlusion. IMPRESSION: 1. Acute nonhemorrhagic infarcts involving the right inferior temporal gyrus and right superior cerebellum. 2. Acute/subacute nonhemorrhagic infarct involving the anterior left pons, likely major cortical spinal tracts. 3. Diffuse white matter disease likely reflects the sequela of chronic microvascular ischemia. 4. Normal variant MRA circle-of-Willis without significant proximal stenosis, aneurysm, or branch vessel occlusion. Electronically Signed: By: San Morelle M.D. On: 10/14/2017 13:37   Mr Brain Wo Contrast  Addendum Date: 10/14/2017   ADDENDUM REPORT: 10/14/2017 14:16 ADDENDUM: Restricted diffusion within soft tissue of the left external auditory canal is highly concerning for an EAC cholesteatoma. Electronically Signed   By: San Morelle M.D.   On: 10/14/2017 14:16   Result Date: 10/14/2017 CLINICAL DATA:  Slurred speech. Right-sided weakness. Symptoms began 4 hours ago. Leaning to the right while walking. EXAM: MRI HEAD WITHOUT CONTRAST MRA HEAD WITHOUT CONTRAST TECHNIQUE: Multiplanar, multiecho pulse sequences of the brain and surrounding structures were obtained without intravenous contrast. Angiographic images of the head were obtained using MRA technique without contrast. COMPARISON:  CT head without contrast 10/14/2017. FINDINGS: MRI HEAD FINDINGS Brain: Acute nonhemorrhagic infarct is confirmed within the right inferior temporal gyrus. Infarct measures up to 1.7 cm in maximal dimension. A subacute infarct in the anterior left pons appears slightly older. A punctate infarct in the superior right cerebellum is acute. A punctate posterior right frontal lobe infarct is present on image 36 of series 3. This appears acute. Diffuse white matter changes are present. Focal T2 signal changes are associated with the areas of restricted diffusion mentioned above. More remote right cerebellar infarcts are present as well. Dilated  perivascular spaces are present throughout the basal ganglia. The ventricles are proportionate to the degree of atrophy. No significant extra-axial fluid collection is present. Vascular: Flow is present in the major intracranial arteries. Skull and upper cervical spine: The skull base is within normal limits. Craniocervical junction is normal. Sinuses/Orbits: The paranasal sinuses and mastoid air cells are clear. Bilateral lens replacements are present. Globes and orbits are otherwise within normal limits. MRA HEAD FINDINGS The internal carotid arteries are within normal limits from the high cervical segments through the ICA termini bilaterally. The left A1 segment is dominant. The anterior communicating artery is patent. MCA bifurcations are normal. ACA and MCA branch vessels are within normal limits. The left vertebral artery is dominant. PICA origins are visualized and normal. The left posterior cerebral artery originates from a normal basilar tip. The right posterior cerebral artery is of fetal type. A small right P1 segment and infundibulum is present. There is no aneurysm, significant proximal stenosis, or branch vessel occlusion. IMPRESSION: 1. Acute nonhemorrhagic infarcts involving the right inferior temporal gyrus and right superior cerebellum. 2. Acute/subacute nonhemorrhagic infarct involving the anterior left pons, likely major cortical spinal tracts. 3. Diffuse white matter disease likely reflects the sequela of chronic microvascular ischemia. 4. Normal variant MRA circle-of-Willis without significant proximal stenosis, aneurysm, or branch vessel occlusion. Electronically Signed: By: San Morelle M.D. On: 10/14/2017 13:37  Dg Swallowing Func-speech Pathology  Result Date: 10/15/2017 Objective Swallowing Evaluation: Type of Study: MBS-Modified Barium Swallow Study  Patient Details Name: Krystal White MRN: [id number] Date of Birth: 03/05/1924 Today's Date: 10/15/2017 Time: SLP Start Time  (ACUTE ONLY): 0858 -SLP Stop Time (ACUTE ONLY): 0915 SLP Time Calculation (min) (ACUTE ONLY): 17 min Past Medical History: Past Medical History: Diagnosis Date . Allergic rhinitis  . Anxiety   unspecified . Back pain   unspecified . Cystitis  . Edema  . Gait abnormality  . Hand pain  . Hearing loss   left ear . Hematuria  . History of atrial fibrillation  . Hyperlipidemia, unspecified  . Hypertension  . Osteoporosis  . Osteoporosis, post-menopausal  . PAF (paroxysmal atrial fibrillation) ([place])  . Polymyalgia ([place])  . Pulmonary hypertension ([place])  . Skin cancer  Past Surgical History: Past Surgical History: Procedure Laterality Date . BREAST BIOPSY   . INGUINAL HERNIA REPAIR Right 2005 . KYPHOPLASTY N/A 04/17/2017  Procedure: [id number];  Surgeon: Hessie Knows, MD;  Location: [facility] ORS;  Service: Orthopedics;  Laterality: N/A; HPI: ZANIA KALISZ is a 84 y.o. female with medical history significant of Atrial fibrillation, CHF, Hypertension and dementia who developed sudden onset of slurred speech and weakness.  MRI with acute nonhemorrhagic infarcts involving the right inferior  No data recorded Assessment / Plan / Recommendation CHL IP CLINICAL IMPRESSIONS 10/15/2017 Clinical Impression Patient presents with a severe oral dysphagia with secondary pharyngeal and esophageal components. CN deficits result in severe oral weakness, primarily right sided, with poor labial seal and lingual movement/ROM. Significant anterior labial spillage of all boluses noted, improving but not eliminated with use of tsp presentation. Loss of bolus over the base of tongue with delayed swallow initiation noted across consistencies with resultant deep penetration and silent aspiration of thin liquids intermittently. Additionally, patient with severe stasis through out esophagus with retrograde flow of bolus into the pyriform sinuses post swallow. Note that thinner consistencies including nectar thick do aid in improved esophageal  clearance but increase aspiration risk. Will initiate a conservative diet and f/u at bedside for trials of nectar thick liquid via tsp (not aspirated during the study but at risk) for hopeful upgrade.  SLP Visit Diagnosis Dysphagia, oropharyngeal phase (R13.12) Attention and concentration deficit following -- Frontal lobe and executive function deficit following -- Impact on safety and function Moderate aspiration risk   CHL IP TREATMENT RECOMMENDATION 10/15/2017 Treatment Recommendations Therapy as outlined in treatment plan below   Prognosis 10/15/2017 Prognosis for Safe Diet Advancement Good Barriers to Reach Goals Severity of deficits Barriers/Prognosis Comment -- CHL IP DIET RECOMMENDATION 10/15/2017 SLP Diet Recommendations Dysphagia 1 (Puree) solids;Honey thick liquids Liquid Administration via Spoon Medication Administration Crushed with puree Compensations Slow rate;Small sips/bites;Multiple dry swallows after each bite/sip Postural Changes Seated upright at 90 degrees;Remain semi-upright after after feeds/meals (Comment)   CHL IP OTHER RECOMMENDATIONS 10/15/2017 Recommended Consults -- Oral Care Recommendations Oral care BID Other Recommendations Order thickener from pharmacy;Prohibited food (jello, ice cream, thin soups);Remove water pitcher   CHL IP FOLLOW UP RECOMMENDATIONS 10/15/2017 Follow up Recommendations Skilled Nursing facility   [facility] IP FREQUENCY AND DURATION 10/15/2017 Speech Therapy Frequency (ACUTE ONLY) min 3x week Treatment Duration 2 weeks      CHL IP ORAL PHASE 10/15/2017 Oral Phase Impaired Oral - Pudding Teaspoon -- Oral - Pudding Cup -- Oral - Honey Teaspoon Right anterior bolus loss;Lingual pumping;Delayed oral transit;Decreased bolus cohesion Oral - Honey Cup Right anterior bolus loss;Lingual pumping;Delayed oral transit;Decreased bolus  cohesion Oral - Nectar Teaspoon Right anterior bolus loss;Lingual pumping;Delayed oral transit;Decreased bolus cohesion Oral - Nectar Cup Right anterior  bolus loss;Lingual pumping;Delayed oral transit;Decreased bolus cohesion Oral - Nectar Straw -- Oral - Thin Teaspoon Right anterior bolus loss;Lingual pumping;Delayed oral transit;Decreased bolus cohesion Oral - Thin Cup Right anterior bolus loss;Lingual pumping;Delayed oral transit;Decreased bolus cohesion Oral - Thin Straw Right anterior bolus loss;Lingual pumping;Delayed oral transit;Decreased bolus cohesion Oral - Puree Right anterior bolus loss;Lingual pumping;Delayed oral transit;Decreased bolus cohesion Oral - Mech Soft -- Oral - Regular -- Oral - Multi-Consistency -- Oral - Pill -- Oral Phase - Comment --  CHL IP PHARYNGEAL PHASE 10/15/2017 Pharyngeal Phase Impaired Pharyngeal- Pudding Teaspoon -- Pharyngeal -- Pharyngeal- Pudding Cup -- Pharyngeal -- Pharyngeal- Honey Teaspoon Delayed swallow initiation-pyriform sinuses;Delayed swallow initiation-vallecula Pharyngeal -- Pharyngeal- Honey Cup Delayed swallow initiation-vallecula;Delayed swallow initiation-pyriform sinuses Pharyngeal -- Pharyngeal- Nectar Teaspoon Delayed swallow initiation-pyriform sinuses Pharyngeal -- Pharyngeal- Nectar Cup Delayed swallow initiation-pyriform sinuses;Penetration/Apiration after swallow Pharyngeal Material enters airway, CONTACTS cords and then ejected out Pharyngeal- Nectar Straw -- Pharyngeal -- Pharyngeal- Thin Teaspoon Delayed swallow initiation-pyriform sinuses;Penetration/Aspiration during swallow Pharyngeal Material enters airway, CONTACTS cords and not ejected out Pharyngeal- Thin Cup Penetration/Aspiration during swallow Pharyngeal Material enters airway, passes BELOW cords without attempt by patient to eject out (silent aspiration) Pharyngeal- Thin Straw Penetration/Aspiration during swallow;Delayed swallow initiation-pyriform sinuses Pharyngeal Material enters airway, passes BELOW cords without attempt by patient to eject out (silent aspiration) Pharyngeal- Puree Delayed swallow initiation-vallecula Pharyngeal --  Pharyngeal- Mechanical Soft -- Pharyngeal -- Pharyngeal- Regular -- Pharyngeal -- Pharyngeal- Multi-consistency -- Pharyngeal -- Pharyngeal- Pill -- Pharyngeal -- Pharyngeal Comment --  CHL IP CERVICAL ESOPHAGEAL PHASE 10/15/2017 Cervical Esophageal Phase Impaired Pudding Teaspoon -- Pudding Cup -- Honey Teaspoon -- Honey Cup -- Nectar Teaspoon -- Nectar Cup -- Nectar Straw -- Thin Teaspoon -- Thin Cup -- Thin Straw -- Puree -- Mechanical Soft -- Regular -- Multi-consistency -- Pill -- Cervical Esophageal Comment see impression statement Leah McCoy MA, CCC-SLP McCoy Leah Meryl 10/15/2017, 9:39 AM               PHYSICAL EXAM  frail elderly Caucasian lady not in distress. . Afebrile. Head is nontraumatic. Neck is supple without bruit.    Cardiac exam no murmur or gallop. Lungs are clear to auscultation. Distal pulses are well felt. Neurological Exam : awake alert oriented 2. Severe dysarthria and can be understood with great difficulty. Can follow simple midline and one-step commands only. Extraocular movements are full range without nystagmus. Blinks to threat bilaterally. Right lower facial weakness. Tongue midline. Motor system exam right hemiplegia with 2-3/5 strength in the right upper extremity and 3/5 strength in right lower extremity. 4+/5 strength in left upper and lower extremities. Deep tendon reflexes are depressed on the right and normal on the left right plantar upgoing left downgoing. Sensation appears intact bilaterally. Gait not tested.  ASSESSMENT/PLAN Krystal White is a 84 y.o. female with history of PAF on eliquis, CHF, HTN, pleural effusion, asthma, pulm HTN, HLD presenting with dysarthria and listing to the R.   Stroke:  right inferior temporal gyrs and R superior cerebellar infarcts, embolic secondary to known atrial fibrillation on eliquis  Code Stroke CT head No left brain acute stroke.  Small recent right temporal lobe infarct.  Soft tissue and left external auditory canal  with left ossicular deficiency.    MRI right inferior temporal gyrus and right superior cerebellar infarcts.  Acute/subacute anterior left pontine infarct.  Chronic microvascular ischemia.  Left external auditory canal EAC cholesteatoma.   MRA normal variant MRA  Carotid Doppler  1-39 percent bilateral ICA stenosis. 2D Echo  Left ventricle: The cavity size was normal. Wall thickness was   normal. Systolic function was vigorous. The estimated ejection   fraction was in the range of 65% to 70%. Wall motion was normal;    there were no regional wall motion abnormalities  LDL 125 mg percent  HgbA1c 5.4  Eliquis for VTE prophylaxis  Eliquis (apixaban) daily 2.5 bid prior to admission, now on Eliquis (apixaban) daily. Continue Eliquis for secondary stroke prevention (on low dose given renal dysfunction)  Therapy recommendations: SNF (from [facility], has legal guardian)  Disposition: Pending  Paroxysmal atrial Fibrillation  Home anticoagulation:  Eliquis (apixaban) daily continued in the hospital . Continue Eliquis (apixaban) daily at discharge   Dysphagia  Secondary to stroke Diet Order            DIET - DYS 1 Room service appropriate? Yes; Fluid consistency: Honey Thick  Diet effective now              Hypertension  Stable . Permissive hypertension (OK if < 220/120) but gradually normalize in 5-7 days . Long-term BP goal normotensive  Hyperlipidemia  Home meds: Zocor 20  Dr. Cheral Marker suggested risk outweigh benefits and with hx of polymyalgia, to not resume  LDL 125 goal < 70  Other Stroke Risk Factors  Advanced age  Former Cigarette smoker, quit 39 years ago  Other Active Problems  Baseline vascular dementia  History polymyalgia rheumatica  Hearing loss left ear, question related to left cholesteatoma seen on MRI  Hospital day # 2    She has presented with several small cardioembolic strokes despite being on anticoagulation with eliquis for  atrial fibrillation. She has mild baseline dementia and has suspect her quality of life is going to be further impaired with the strokes. Recommend continue eliquis for stroke prevention and increase simvastatin dose to 40 mg daily.  . Discussed with Dr. Florene Glen. Transfer to skilled nursing facility for rehabilitation when bed available. Stroke team will sign off. Kindly call for questions  Antony Contras, MD Medical Director [place] Pager: [phone] 10/16/2017 12:33 PM  To contact Stroke Continuity provider, please refer to [email or web address] After hours, contact General Neurology

## 2017-11-24 ENCOUNTER — Ambulatory Visit (INDEPENDENT_AMBULATORY_CARE_PROVIDER_SITE_OTHER): Payer: Medicare Other | Admitting: Adult Health

## 2017-11-24 ENCOUNTER — Encounter: Payer: Self-pay | Admitting: Adult Health

## 2017-11-24 VITALS — BP 150/58 | HR 57 | Ht 61.0 in

## 2017-11-24 DIAGNOSIS — I69351 Hemiplegia and hemiparesis following cerebral infarction affecting right dominant side: Secondary | ICD-10-CM

## 2017-11-24 DIAGNOSIS — I10 Essential (primary) hypertension: Secondary | ICD-10-CM

## 2017-11-24 DIAGNOSIS — I639 Cerebral infarction, unspecified: Secondary | ICD-10-CM

## 2017-11-24 DIAGNOSIS — I4891 Unspecified atrial fibrillation: Secondary | ICD-10-CM

## 2017-11-24 DIAGNOSIS — R471 Dysarthria and anarthria: Secondary | ICD-10-CM

## 2017-11-24 DIAGNOSIS — E785 Hyperlipidemia, unspecified: Secondary | ICD-10-CM | POA: Diagnosis not present

## 2017-11-24 NOTE — Patient Instructions (Signed)
Continue Eliquis (apixaban) daily  and simvastatin  for secondary stroke prevention  Continue to follow up with PCP regarding cholesterol and blood pressure management   Ensure f/u appointment made with ENT for continued follow up/monitoring   Continue therapies for continued deficits  Continue to monitor blood pressure at facility   Maintain strict control of hypertension with blood pressure goal below 130/90, diabetes with hemoglobin A1c goal below 6.5% and cholesterol with LDL cholesterol (bad cholesterol) goal below 70 mg/dL. I also advised the patient to eat a healthy diet with plenty of whole grains, cereals, fruits and vegetables, exercise regularly and maintain ideal body weight.  Followup in the future with me in 3 months or call earlier if needed       Thank you for coming to see [place] at [facility]. I hope we have been able to provide you high quality care today.  You may receive a patient satisfaction survey over the next few weeks. We would appreciate your feedback and comments so that we may continue to improve ourselves and the health of our patients.

## 2017-11-24 NOTE — Progress Notes (Signed)
[facility] [address] [place]. [place] [address] [phone]       OFFICE FOLLOW UP NOTE  [name] Date of Birth:  1924-10-22 Medical Record Number:  [id number]   Reason for Referral:  hospital stroke follow up  CHIEF COMPLAINT:  Chief Complaint  Patient presents with  . Follow-up    Stroke follow up roonm 9 pt with Krystal White her CNA at [employer] facility pt in wheelchair    HPI: Krystal White is being seen today for initial visit in the office for right inferior temporal gyrus and right superior cerebellar infarcts secondary to atrial fibrillation on 10/14/2017. History obtained from patient, CNA and chart review. Reviewed all radiology images and labs personally.  [name] is a 84 y.o. female with history of PAF on eliquis, CHF, HTN, pleural effusion, asthma, pulm HTN, HLD who presented with dysarthria and listing to the right.  CT head reviewed and showed small recent right temporal lobe infarct along with soft tissue and left external auditory canal with left vascular deficiency.  MRI head reviewed and showed right inferior temporal gyrus and right superior cerebellar infarcts with acute/subacute anterior left pontine infarct and left external auditory canal EAC cholesteatoma.  MRA normal.  Carotid Doppler showed bilateral ICA stenosis of 1 to 39%.  2D echo showed an EF of 65 to 70%.  LDL 125 and A1c 5.4.  Was recommended to continue Eliquis along with increasing simvastatin dose of 40 mg daily.  Patient was discharged to SNF for continued rehab.  Patient is being seen today for hospital follow up and is accompanied by SNF CNA.Marland Kitchen She is currently residing at [facility] and is participating in PT/OT/ST for continued right hemiparesis and dysarthria.  She does state that both dysarthria and right hemiparesis have been improving.  She is currently using a wheelchair for long distance but is able to ambulate with rolling walker for short distance  during PT.  She continues to take Eliquis without side effects of bleeding or bruising.  Continues to take simvastatin without side effects of myalgias.  Blood pressure today elevated at 150/58 but does state at facility is typically lower.  She is currently wearing 2 L O2 [place] ATC.  She did have follow-up appointment with ENT as recommended at hospital discharge at [facility] on 11/03/2017 and did have large amount of wax removed per patient and CNA.  Denies new or worsening stroke/TIA symptoms.   ROS:   14 system review of systems performed and negative with exception of weakness and speech difficulty  PMH:  Past Medical History:  Diagnosis Date  . Allergic rhinitis   . Anxiety    unspecified  . Back pain    unspecified  . Cystitis   . Edema   . Gait abnormality   . Hand pain   . Hearing loss    left ear  . Hematuria   . History of atrial fibrillation   . Hyperlipidemia, unspecified   . Hypertension   . Osteoporosis   . Osteoporosis, post-menopausal   . PAF (paroxysmal atrial fibrillation) ([place])   . Polymyalgia ([place])   . Pulmonary hypertension ([place])   . Skin cancer     PSH:  Past Surgical History:  Procedure Laterality Date  . BREAST BIOPSY    . INGUINAL HERNIA REPAIR Right 2005  . KYPHOPLASTY N/A 04/17/2017   Procedure: [id number];  Surgeon: Hessie Knows, MD;  Location: [facility] ORS;  Service: Orthopedics;  Laterality: N/A;  Social History:  Social History   Socioeconomic History  . Marital status: Widowed    Spouse name: Not on file  . Number of children: 1  . Years of education: 63  . Highest education level: High school graduate  Occupational History  . Not on file  Social Needs  . Financial resource strain: Not hard at all  . Food insecurity:    Worry: Patient refused    Inability: Patient refused  . Transportation needs:    Medical: Yes    Non-medical: Yes  Tobacco Use  . Smoking status: Former Smoker    Packs/day: 1.00    Years: 25.00     Pack years: 25.00    Types: Cigarettes    Last attempt to quit: 02/24/1978    Years since quitting: 39.7  . Smokeless tobacco: Never Used  Substance and Sexual Activity  . Alcohol use: No  . Drug use: No  . Sexual activity: Never  Lifestyle  . Physical activity:    Days per week: 4 days    Minutes per session: 10 min  . Stress: Rather much  Relationships  . Social connections:    Talks on phone: More than three times a week    Gets together: Three times a week    Attends religious service: More than 4 times per year    Active member of club or organization: No    Attends meetings of clubs or organizations: Never    Relationship status: Widowed  . Intimate partner violence:    Fear of current or ex partner: No    Emotionally abused: No    Physically abused: No    Forced sexual activity: No  Other Topics Concern  . Not on file  Social History Narrative   Former smoker   No alcohol use   Widowed   1 child   DNR    Family History:  Family History  Problem Relation Age of Onset  . Heart failure Sister   . Cancer Sister   . Aneurysm Mother   . Colon cancer Father   . Heart attack Brother   . Cancer Sister     Medications:   Current Outpatient Medications on File Prior to Visit  Medication Sig Dispense Refill  . albuterol (PROVENTIL HFA;VENTOLIN HFA) 108 (90 Base) MCG/ACT inhaler Inhale 2 puffs into the lungs every 6 (six) hours as needed for wheezing or shortness of breath.    . Amino Acids-Protein Hydrolys (FEEDING SUPPLEMENT, PRO-STAT SUGAR FREE 64,) LIQD Take 30 mLs by mouth 2 (two) times daily between meals.     Marland Kitchen amiodarone (PACERONE) 200 MG tablet Take 1 tablet (200 mg total) by mouth daily. 180 tablet 0  . apixaban (ELIQUIS) 2.5 MG TABS tablet Take 1 tablet (2.5 mg total) by mouth 2 (two) times daily. 60 tablet 1  . azelastine (ASTELIN) 0.1 % nasal spray Place 2 sprays into both nostrils 2 (two) times daily. Use in each nostril as directed    .  budesonide-formoterol (SYMBICORT) 160-4.5 MCG/ACT inhaler Inhale 2 puffs into the lungs 2 (two) times daily. 1 Inhaler 12  . carboxymethylcellulose (REFRESH PLUS) 0.5 % SOLN Place 1 drop into both eyes 2 (two) times daily.    Marland Kitchen donepezil (ARICEPT) 10 MG tablet Take 10 mg by mouth at bedtime.    . fluticasone (FLONASE) 50 MCG/ACT nasal spray Place 2 sprays into the nose daily.     . furosemide (LASIX) 40 MG tablet Take 1 tablet (40 mg  total) by mouth daily. 30 tablet 0  . metoprolol tartrate (LOPRESSOR) 25 MG tablet Take 0.5 tablets (12.5 mg total) by mouth 2 (two) times daily. (Patient taking differently: Take 25 mg by mouth 2 (two) times daily. ) 60 tablet 0  . potassium chloride (MICRO-K) 10 MEQ CR capsule Take 10 mEq by mouth daily.    . simvastatin (ZOCOR) 40 MG tablet Take 1 tablet (40 mg total) by mouth daily at 6 PM. 30 tablet 0   No current facility-administered medications on file prior to visit.     Allergies:  No Known Allergies   Physical Exam  Vitals:   11/24/17 1540  BP: (!) 150/58  Pulse: (!) 57  Height: 5\' 1"  (1.549 m)   Body mass index is 21.21 kg/m. No exam data present  General: well developed, well nourished, pleasant elderly Caucasian female, seated, in no evident distress Head: head normocephalic and atraumatic.   Neck: supple with no carotid or supraclavicular bruits Cardiovascular: regular rate and rhythm, no murmurs Musculoskeletal: no deformity Skin:  no rash/petichiae Vascular:  Normal pulses all extremities  Neurologic Exam Mental Status: Awake and fully alert.  Mild dysarthria noted.  Oriented to place and time. Recent and remote memory intact. Attention span, concentration and fund of knowledge appropriate. Mood and affect appropriate.  Cranial Nerves: Fundoscopic exam reveals sharp disc margins. Pupils equal, briskly reactive to light. Extraocular movements full without nystagmus. Visual fields full to confrontation.  HOH. Facial sensation intact.  Face, tongue, palate moves normally and symmetrically.  Motor: Normal bulk and tone.  RUE and RLE: 4/5; normal strength in left upper and lower extremity Sensory.: intact to touch , pinprick , position and vibratory sensation.  Coordination: Rapid alternating movements normal in all extremities. Finger-to-nose and heel-to-shin performed accurately bilaterally.  Orbits left arm over right arm.  Decreased right hand dexterity. Gait and Station: Patient currently sitting in wheelchair and does not have rolling walker available during appointment therefore assessment deferred Reflexes: 1+ and symmetric. Toes downgoing.    NIHSS  1 Modified Rankin  3 HAS-BLED 2 CHA2DS2-VASc 6   Diagnostic Data (Labs, Imaging, Testing)  CT HEAD WO CONTRAST 10/14/2017 IMPRESSION: 1. Small recent appearing infarct in the superficial and inferior right temporal lobe. 2. No left-sided infarct is seen in this patient with right-sided weakness. 3. Soft tissue fills the left external auditory canal and there is left ossicular deficiency. Need visual inspection to differentiate cerumen from cholesteatoma.  MR BRAIN WO CONTRAST MR MRA HEAD  10/14/2017 IMPRESSION: 1. Acute nonhemorrhagic infarcts involving the right inferior temporal gyrus and right superior cerebellum. 2. Acute/subacute nonhemorrhagic infarct involving the anterior left pons, likely major cortical spinal tracts. 3. Diffuse white matter disease likely reflects the sequela of chronic microvascular ischemia. 4. Normal variant MRA circle-of-Willis without significant proximal stenosis, aneurysm, or branch vessel occlusion.  ECHOCARDIOGRAM 10/15/2017 Impressions: - Compared to a study in 01/2017, the LVEF is higher at 65-70%,   There is now moderate aortic stenosis and mild AI with a mean   gradient of 13 mmHg.  VAS US CAROTID DUPLEX BILATERAL 10/15/2017 Right Carotid: Velocities in the right ICA are consistent with a 1-39% stenosis. Left  Carotid: Velocities in the left ICA are consistent with a 1-39% stenosis. Vertebrals: Bilateral vertebral arteries demonstrate antegrade flow. Subclavians: Normal flow hemodynamics were seen in bilateral subclavian arteries.    ASSESSMENT: Krystal White is a 84 y.o. year old female here with right inferior temporal gyrus and right superior cerebellum infarcts  on 10/14/2017 secondary to known AF on Eliquis. Vascular risk factors include PAF on Eliquis, CHF, HTN, and HLD.  Patient is being seen today for hospital follow-up and continues to have deficits of dysarthria and right hemiparesis but overall has been improving and stable from stroke standpoint.    PLAN: -Continue Eliquis (apixaban) daily  and simvastatin for secondary stroke prevention -F/u with PCP regarding your HLD and HTN management -F/U with cardiology for continued AF and Eliquis management -F/U with ENT as recommended for need of follow-up/monitoring -Continue PT/OT/ST for continued deficits -continue to monitor BP at home -advised to continue to stay active and maintain a healthy diet -Maintain strict control of hypertension with blood pressure goal below 130/90, diabetes with hemoglobin A1c goal below 6.5% and cholesterol with LDL cholesterol (bad cholesterol) goal below 70 mg/dL. I also advised the patient to eat a healthy diet with plenty of whole grains, cereals, fruits and vegetables, exercise regularly and maintain ideal body weight.  Follow up in 3 months or call earlier if needed   Greater than 50% of time during this 25 minute visit was spent on counseling,explanation of diagnosis of right inferior temporal gyrus and right superior cerebellum infarcts, reviewing risk factor management of PAF, CHF, HTN and HLD, planning of further management, discussion with patient and family and coordination of care    Venancio Poisson, AGNP-BC  [facility] [address] [place], [place]  [address]  Phone [phone] Fax [phone] Note: This document was prepared with digital dictation and possible smart phrase technology. Any transcriptional errors that result from this process are unintentional.

## 2017-11-24 NOTE — Progress Notes (Signed)
I agree with the above plan 

## 2017-12-02 ENCOUNTER — Encounter: Payer: Self-pay | Admitting: Nurse Practitioner

## 2017-12-02 ENCOUNTER — Ambulatory Visit (INDEPENDENT_AMBULATORY_CARE_PROVIDER_SITE_OTHER): Payer: Medicare Other | Admitting: Nurse Practitioner

## 2017-12-02 VITALS — BP 122/60 | HR 53 | Wt 110.0 lb

## 2017-12-02 DIAGNOSIS — I10 Essential (primary) hypertension: Secondary | ICD-10-CM | POA: Diagnosis not present

## 2017-12-02 DIAGNOSIS — I639 Cerebral infarction, unspecified: Secondary | ICD-10-CM | POA: Diagnosis not present

## 2017-12-02 DIAGNOSIS — I5032 Chronic diastolic (congestive) heart failure: Secondary | ICD-10-CM | POA: Diagnosis not present

## 2017-12-02 DIAGNOSIS — I480 Paroxysmal atrial fibrillation: Secondary | ICD-10-CM

## 2017-12-02 DIAGNOSIS — I350 Nonrheumatic aortic (valve) stenosis: Secondary | ICD-10-CM

## 2017-12-02 DIAGNOSIS — E782 Mixed hyperlipidemia: Secondary | ICD-10-CM

## 2017-12-02 NOTE — Progress Notes (Signed)
Office Visit    Patient Name: Krystal White Date of Encounter: 12/02/2017  Primary Care Provider:  Derinda Late, MD Primary Cardiologist:  Kathlyn Sacramento, MD  Chief Complaint    84 year old female with a history of hypertension, hyperlipidemia, HFpEF, paroxysmal atrial fibrillation on Eliquis, pulmonary hypertension, osteoporosis, polymyalgia, and hearing loss who presents for follow-up after recent hospitalization related to embolic stroke.  Past Medical History    Past Medical History:  Diagnosis Date  . (HFpEF) heart failure with preserved ejection fraction ([place])    a. 01/2017 Echo: Ef 55-60%, no rwma, mild AI/MR, mildly dil LA. Mod dil RV w/ mildly reduced RV fxn. Sev dil RA. Mod TR. PASP 17mmHg; b. 09/2017 Echo: Ef 65-70%, no rwma, mod AS, mild AI, triv MR, nl LA size.  . Allergic rhinitis   . Anxiety    unspecified  . Back pain    unspecified  . Cystitis   . Edema   . Embolic stroke ([place])    a. 09/2017 Acute nonhemorrhagic infarct of R inferior temproal gyrus & right superior cerebellum, w/ acute/subacute infarct of ant L pons (likely major cortical spinal tracts); b. 09/2017 Carotid U/S: 1-39% bilat ICA stenosis.  . Gait abnormality   . Hand pain   . Hearing loss    left ear  . Hematuria   . Hypertension   . Mixed hyperlipidemia   . Moderate aortic stenosis    a. 09/2017 Echo: Mod AS, mild AI. Mean grad (S): 82mmHg. Peak grad (S): 74mmHg. Valve area (VTI): 1.11cm^2, (Vmax): 1.08cm^2, (Vmean): 1.03cm^2.  . Osteoporosis, post-menopausal   . PAF (paroxysmal atrial fibrillation) ([facility])    a. CHA2DS2VASc = 6-->Eliquis.  . Polymyalgia ([place])   . Pulmonary hypertension ([place])    a. 01/2017 Echo: PASP 40mmHg.  . Skin cancer    Past Surgical History:  Procedure Laterality Date  . BREAST BIOPSY    . INGUINAL HERNIA REPAIR Right 2005  . KYPHOPLASTY N/A 04/17/2017   Procedure: [id number];  Surgeon: Hessie Knows, MD;  Location: [facility] ORS;  Service: Orthopedics;   Laterality: N/A;    Allergies  No Known Allergies  History of Present Illness    84 year old female with the above complex past medical history including HFpEF, hypertension, hyperlipidemia, paroxysmal atrial fibrillation on Eliquis, pulmonary hypertension, osteoporosis, polymyalgia, hearing loss, and recent stroke.  She was diagnosed with atrial fibrillation in the setting of diastolic heart failure in December 2018, at which time echocardiogram showed normal LV function.  She was recently admitted to [facility] at the end of August secondary to acute onset of right-sided weakness and slurred speech.  She was found to have an acute nonhemorrhagic infarct of the right inferior temporal gyrus and right superior cerebellum as well as acute/subacute nonhemorrhagic infarct of the anterior left pons.  Carotid Doppler showed insignificant bilateral disease.  Echo showed normal LV function with moderate aortic stenosis.  Stroke was felt to be embolic in nature.  Patient was on Eliquis and in sinus rhythm on admission.  Neurology saw patient and discussed with family and family preferred for patient to remain on Eliquis therapy.  Since her discharge, she has been working with physical therapy on a regular basis and has regained much of her strength and speech.  Skilled nursing facility employee is present with her today and corroborates that she has been exercising regularly with leg lifts and hand grasps and slowly but surely improving.  Patient says she is frustrated that her speech has not  completely normalized at this point.  She sometimes has dysphagia in the setting of thin liquids that she might drink too fast.  Overall though she has been tolerating pured meals.  She has not had any chest pain, dyspnea, palpitations, PND, orthopnea, dizziness, syncope, edema, or early satiety.    Home Medications    Prior to Admission medications   Medication Sig Start Date End Date Taking? Authorizing Provider    albuterol (PROVENTIL HFA;VENTOLIN HFA) 108 (90 Base) MCG/ACT inhaler Inhale 2 puffs into the lungs every 6 (six) hours as needed for wheezing or shortness of breath.    [provider]  Amino Acids-Protein Hydrolys (FEEDING SUPPLEMENT, PRO-STAT SUGAR FREE 64,) LIQD Take 30 mLs by mouth 2 (two) times daily between meals.     [provider]  amiodarone (PACERONE) 200 MG tablet Take 1 tablet (200 mg total) by mouth daily. 03/07/17   Salary, Avel Peace, MD  apixaban (ELIQUIS) 2.5 MG TABS tablet Take 1 tablet (2.5 mg total) by mouth 2 (two) times daily. 02/07/17   Henreitta Leber, MD  azelastine (ASTELIN) 0.1 % nasal spray Place 2 sprays into both nostrils 2 (two) times daily. Use in each nostril as directed    [provider]  budesonide-formoterol (SYMBICORT) 160-4.5 MCG/ACT inhaler Inhale 2 puffs into the lungs 2 (two) times daily. 03/06/17   Salary, Avel Peace, MD  carboxymethylcellulose (REFRESH PLUS) 0.5 % SOLN Place 1 drop into both eyes 2 (two) times daily.    [provider]  donepezil (ARICEPT) 10 MG tablet Take 10 mg by mouth at bedtime.    [provider]  fluticasone (FLONASE) 50 MCG/ACT nasal spray Place 2 sprays into the nose daily.  12/23/16 12/23/17  [provider]  furosemide (LASIX) 40 MG tablet Take 1 tablet (40 mg total) by mouth daily. 03/07/17   Salary, Avel Peace, MD  metoprolol tartrate (LOPRESSOR) 25 MG tablet Take 0.5 tablets (12.5 mg total) by mouth 2 (two) times daily. Patient taking differently: Take 25 mg by mouth 2 (two) times daily.  03/06/17   Salary, Avel Peace, MD  potassium chloride (MICRO-K) 10 MEQ CR capsule Take 10 mEq by mouth daily.    [provider]  simvastatin (ZOCOR) 40 MG tablet Take 1 tablet (40 mg total) by mouth daily at 6 PM. 10/16/17 11/15/17  Elodia Florence., MD    Review of Systems    Following stroke, she has some residual right-sided weakness but this is slowly improving.  She is  currently wheelchair-bound but aide says that she shows good strength during transfers.  She denies chest pain, palpitations, dyspnea, PND, orthopnea, dizziness, syncope, edema, or early satiety.  All other systems reviewed and are otherwise negative except as noted above.  Physical Exam    VS:  BP 122/60 (BP Location: Right Arm, Patient Position: Sitting, Cuff Size: Normal)   Pulse (!) 53   Wt 110 lb (49.9 kg)   BMI 20.78 kg/m  , BMI Body mass index is 20.78 kg/m. GEN: Well nourished, well developed, in no acute distress. HEENT: normal. Neck: Supple, no JVD, carotid bruits, or masses. Cardiac: RRR, 2/6 systolic ejection murmur at the upper sternal borders, no rubs, or gallops. No clubbing, cyanosis, edema.  Radials/DP/PT 2+ and equal bilaterally.  Respiratory:  Respirations regular and unlabored, clear to auscultation bilaterally. GI: Soft, nontender, nondistended, BS + x 4. MS: no deformity or atrophy. Skin: warm and dry, no rash. Neuro: Mild right upper extremity weakness, moderate  right lower extremity weakness.  Mild dysarthria.  Psych: Normal affect.  Accessory Clinical Findings    ECG personally reviewed by me today -sinus bradycardia with mild anteroseptal ST depression- no acute changes.  Assessment & Plan    1.  Paroxysmal atrial fibrillation: Patient maintaining sinus rhythm on amiodarone and is anticoagulated with reduced dose Eliquis.  She suffered an embolic stroke in August and was seen by neurology.  Neurologists and family decided to continue Eliquis therapy.  Of note, she was in sinus rhythm on arrival and apparently had not missed any doses of Eliquis.  She remains in sinus rhythm today and overall has been feeling well recovering from her stroke.  No changes today-continue beta-blocker, amiodarone, and Eliquis.  2.  Embolic stroke: Slowly but surely recovering with improved strength on the right side and reduced dysarthria.  She has had some dysphagia especially if she  drinks thin liquids quickly.  She may need to follow-up with speech eval if not recently performed.  3.  HFpEF: Euvolemic on exam.  Heart rate and blood pressure stable.  Continue current medications.  4.  Moderate aortic stenosis: Asymptomatic.  Continue beta-blocker therapy.  5.  Hyperlipidemia: LDL in August was 125.  She is on moderate dose simvastatin therapy.  6.  Essential HTN:  Stable.  7.  Disposition: Follow-up in clinic in 3 months or sooner if necessary.  Murray Hodgkins, NP 12/02/2017, 12:16 PM

## 2017-12-02 NOTE — Patient Instructions (Signed)
Medication Instructions:  - Your physician recommends that you continue on your current medications as directed. Please refer to the Current Medication list given to you today.  If you need a refill on your cardiac medications before your next appointment, please call your pharmacy.   Lab work: - none ordered  If you have labs (blood work) drawn today and your tests are completely normal, you will receive your results only by: Marland Kitchen MyChart Message (if you have MyChart) OR . A paper copy in the mail If you have any lab test that is abnormal or we need to change your treatment, we will call you to review the results.  Testing/Procedures: - none ordered  Follow-Up: At [facility], you and your health needs are our priority.  As part of our continuing mission to provide you with exceptional heart care, we have created designated Provider Care Teams.  These Care Teams include your primary Cardiologist (physician) and Advanced Practice Providers (APPs -  Physician Assistants and Nurse Practitioners) who all work together to provide you with the care you need, when you need it. You will need a follow up appointment in 3- 4 months.  Please call our office 2 months in advance to schedule this appointment.  You may see Kathlyn Sacramento, MD or one of the following Advanced Practice Providers on your designated Care Team:   Murray Hodgkins, NP Christell Faith, PA-C . Marrianne Mood, PA-C  Any Other Special Instructions Will Be Listed Below (If Applicable).

## 2017-12-21 ENCOUNTER — Ambulatory Visit: Payer: Medicare Other | Attending: Family | Admitting: Family

## 2017-12-21 ENCOUNTER — Encounter: Payer: Self-pay | Admitting: Family

## 2017-12-21 VITALS — BP 120/60 | HR 61 | Resp 18 | Ht 61.0 in | Wt 110.0 lb

## 2017-12-21 DIAGNOSIS — Z87891 Personal history of nicotine dependence: Secondary | ICD-10-CM | POA: Diagnosis not present

## 2017-12-21 DIAGNOSIS — I110 Hypertensive heart disease with heart failure: Secondary | ICD-10-CM | POA: Diagnosis not present

## 2017-12-21 DIAGNOSIS — Z8249 Family history of ischemic heart disease and other diseases of the circulatory system: Secondary | ICD-10-CM | POA: Insufficient documentation

## 2017-12-21 DIAGNOSIS — M810 Age-related osteoporosis without current pathological fracture: Secondary | ICD-10-CM | POA: Diagnosis not present

## 2017-12-21 DIAGNOSIS — Z7901 Long term (current) use of anticoagulants: Secondary | ICD-10-CM | POA: Diagnosis not present

## 2017-12-21 DIAGNOSIS — Z79899 Other long term (current) drug therapy: Secondary | ICD-10-CM | POA: Insufficient documentation

## 2017-12-21 DIAGNOSIS — Z8673 Personal history of transient ischemic attack (TIA), and cerebral infarction without residual deficits: Secondary | ICD-10-CM | POA: Diagnosis not present

## 2017-12-21 DIAGNOSIS — H9192 Unspecified hearing loss, left ear: Secondary | ICD-10-CM | POA: Diagnosis not present

## 2017-12-21 DIAGNOSIS — E782 Mixed hyperlipidemia: Secondary | ICD-10-CM | POA: Insufficient documentation

## 2017-12-21 DIAGNOSIS — Z85828 Personal history of other malignant neoplasm of skin: Secondary | ICD-10-CM | POA: Diagnosis not present

## 2017-12-21 DIAGNOSIS — I5032 Chronic diastolic (congestive) heart failure: Secondary | ICD-10-CM

## 2017-12-21 DIAGNOSIS — I480 Paroxysmal atrial fibrillation: Secondary | ICD-10-CM | POA: Insufficient documentation

## 2017-12-21 DIAGNOSIS — M353 Polymyalgia rheumatica: Secondary | ICD-10-CM | POA: Diagnosis not present

## 2017-12-21 DIAGNOSIS — Z7951 Long term (current) use of inhaled steroids: Secondary | ICD-10-CM | POA: Insufficient documentation

## 2017-12-21 DIAGNOSIS — I272 Pulmonary hypertension, unspecified: Secondary | ICD-10-CM | POA: Diagnosis not present

## 2017-12-21 DIAGNOSIS — I509 Heart failure, unspecified: Secondary | ICD-10-CM | POA: Diagnosis present

## 2017-12-21 DIAGNOSIS — I10 Essential (primary) hypertension: Secondary | ICD-10-CM

## 2017-12-21 NOTE — Patient Instructions (Signed)
Continue weighing daily and call for an overnight weight gain of > 2 pounds or a weekly weight gain of >5 pounds. 

## 2017-12-21 NOTE — Progress Notes (Signed)
Patient ID: Krystal White, female    DOB: Feb 23, 1925, 84 y.o.   MRN: [id number]  HPI  Krystal White is a 84 y/o female with a history of HTN, atrial fibrillation, osteoporosis and chronic heart failure.   Echo report from 10/15/17 reviewed and showed an EF of 65-70% along with moderate AS and mild AR. Echo report from 02/02/17 reviewed and showed an EF of 55-60% along with moderate TR, mild AR/MR and severely elevated PA pressure of 65 mm Hg.  Admitted 10/14/17 due to acute stroke. Neurology consult obtained. Carotid dopplers were done. Discharged to SNF after 2 days. Admitted 04/17/17 due to kyphoplasty T12 and discharged the same day. Was in the ED 04/13/17 due to back pain from compression fracture. She was evaluated and released. Was in the ED 04/11/17 due to worsening back pain. CXR and CT scan showed acute thoracic fracture. Placed in back brace and released. Admitted 03/02/17 due to HF exacerbation. Cardiology consult obtained. Initially needed IV diuretics and then transitioned to oral diuretics. Medications adjusted and she was discharged to SNF after 5 day. Admitted 02/02/17 due to acute HF along with AF with RVR. Initially needed IV diuretics along with IV cardizem and IV metoprolol. Cardiology consult obtained. Oral medications were adjusted. Discharged home after 5 days.   She presents today for a follow-up visit with a chief complaint of minimal fatigue upon moderate exertion. She describes this as chronic in nature having been present for several years. She has associated cough and hearing loss along with this. She denies any difficulty sleeping, abdominal distention, palpitations, chest pain, shortness of breath, pedal edema, dizziness or weight gain.   Past Medical History:  Diagnosis Date  . (HFpEF) heart failure with preserved ejection fraction ([place])    a. 01/2017 Echo: Ef 55-60%, no rwma, mild AI/MR, mildly dil LA. Mod dil RV w/ mildly reduced RV fxn. Sev dil RA. Mod TR. PASP 36mmHg;  b. 09/2017 Echo: Ef 65-70%, no rwma, mod AS, mild AI, triv MR, nl LA size.  . Allergic rhinitis   . Anxiety    unspecified  . Back pain    unspecified  . Cystitis   . Edema   . Embolic stroke ([place])    a. 09/2017 Acute nonhemorrhagic infarct of R inferior temproal gyrus & right superior cerebellum, w/ acute/subacute infarct of ant L pons (likely major cortical spinal tracts); b. 09/2017 Carotid U/S: 1-39% bilat ICA stenosis.  . Gait abnormality   . Hand pain   . Hearing loss    left ear  . Hematuria   . Hypertension   . Mixed hyperlipidemia   . Moderate aortic stenosis    a. 09/2017 Echo: Mod AS, mild AI. Mean grad (S): 75mmHg. Peak grad (S): 74mmHg. Valve area (VTI): 1.11cm^2, (Vmax): 1.08cm^2, (Vmean): 1.03cm^2.  . Osteoporosis, post-menopausal   . PAF (paroxysmal atrial fibrillation) (HCC)    a. CHA2DS2VASc = 6-->Eliquis.  . Polymyalgia ([place])   . Pulmonary hypertension ([place])    a. 01/2017 Echo: PASP 93mmHg.  . Skin cancer    Past Surgical History:  Procedure Laterality Date  . BREAST BIOPSY    . INGUINAL HERNIA REPAIR Right 2005  . KYPHOPLASTY N/A 04/17/2017   Procedure: [id number];  Surgeon: Hessie Knows, MD;  Location: [facility] ORS;  Service: Orthopedics;  Laterality: N/A;   Family History  Problem Relation Age of Onset  . Heart failure Sister   . Cancer Sister   . Aneurysm Mother   . Colon cancer Father   .  Heart attack Brother   . Cancer Sister    Social History   Tobacco Use  . Smoking status: Former Smoker    Packs/day: 1.00    Years: 25.00    Pack years: 25.00    Types: Cigarettes    Last attempt to quit: 02/24/1978    Years since quitting: 39.8  . Smokeless tobacco: Never Used  Substance Use Topics  . Alcohol use: No   No Known Allergies  Prior to Admission medications   Medication Sig Start Date End Date Taking? Authorizing Provider  albuterol (PROVENTIL HFA;VENTOLIN HFA) 108 (90 Base) MCG/ACT inhaler Inhale 2 puffs into the lungs every 6 (six) hours  as needed for wheezing or shortness of breath.   Yes [provider]  Amino Acids-Protein Hydrolys (FEEDING SUPPLEMENT, PRO-STAT SUGAR FREE 64,) LIQD Take 30 mLs by mouth 2 (two) times daily between meals.    Yes [provider]  amiodarone (PACERONE) 200 MG tablet Take 1 tablet (200 mg total) by mouth daily. 03/07/17  Yes Salary, Avel Peace, MD  apixaban (ELIQUIS) 2.5 MG TABS tablet Take 1 tablet (2.5 mg total) by mouth 2 (two) times daily. 02/07/17  Yes Sainani, Belia Heman, MD  azelastine (ASTELIN) 0.1 % nasal spray Place 2 sprays into both nostrils 2 (two) times daily. Use in each nostril as directed   Yes [provider]  budesonide-formoterol (SYMBICORT) 160-4.5 MCG/ACT inhaler Inhale 2 puffs into the lungs 2 (two) times daily. 03/06/17  Yes Salary, Avel Peace, MD  carboxymethylcellulose (REFRESH PLUS) 0.5 % SOLN Place 1 drop into both eyes 2 (two) times daily.   Yes [provider]  donepezil (ARICEPT) 10 MG tablet Take 10 mg by mouth at bedtime.   Yes [provider]  fluticasone (FLONASE) 50 MCG/ACT nasal spray Place 2 sprays into the nose daily.  12/23/16 12/23/17 Yes [provider]  furosemide (LASIX) 40 MG tablet Take 1 tablet (40 mg total) by mouth daily. 03/07/17  Yes Salary, Avel Peace, MD  metoprolol tartrate (LOPRESSOR) 25 MG tablet Take 0.5 tablets (12.5 mg total) by mouth 2 (two) times daily. 03/06/17  Yes Salary, Holly Bodily D, MD  potassium chloride (MICRO-K) 10 MEQ CR capsule Take 10 mEq by mouth daily.   Yes [provider]  simvastatin (ZOCOR) 40 MG tablet Take 1 tablet (40 mg total) by mouth daily at 6 PM. 10/16/17 12/21/17 Yes Elodia Florence., MD    Review of Systems  Constitutional: Positive for fatigue. Negative for appetite change.  HENT: Positive for hearing loss and rhinorrhea. Negative for congestion and sore throat.   Eyes: Negative.   Respiratory: Positive for cough (dry cough). Negative for chest tightness  and shortness of breath.   Cardiovascular: Negative for chest pain, palpitations and leg swelling.  Gastrointestinal: Negative for abdominal distention and abdominal pain.  Endocrine: Negative.   Genitourinary: Negative.   Musculoskeletal: Negative for back pain and neck pain.  Skin: Negative.   Allergic/Immunologic: Negative.   Neurological: Negative for dizziness, light-headedness and headaches.  Hematological: Negative for adenopathy. Does not bruise/bleed easily.  Psychiatric/Behavioral: Negative for dysphoric mood and sleep disturbance. The patient is not nervous/anxious.    Vitals:   12/21/17 0934 12/21/17 0947  BP: (!) 128/104 120/60  Pulse: 61   Resp: 18   SpO2: 97%   Weight: 110 lb (49.9 kg)   Height: 5\' 1"  (1.549 m)    Wt Readings from Last 3 Encounters:  12/21/17 110 lb (49.9 kg)  12/02/17 110  lb (49.9 kg)  06/22/17 112 lb 4 oz (50.9 kg)   Lab Results  Component Value Date   CREATININE 1.09 (H) 10/15/2017   CREATININE 1.20 (H) 10/14/2017   CREATININE 1.19 (H) 10/14/2017    Physical Exam  Constitutional: She is oriented to person, place, and time. She appears well-developed and well-nourished.  HENT:  Head: Normocephalic and atraumatic.  Right Ear: Decreased hearing is noted.  Left Ear: Decreased hearing is noted.  Neck: Normal range of motion. Neck supple. No JVD present.  Cardiovascular: Normal rate and regular rhythm.  Pulmonary/Chest: Effort normal. She has no wheezes. She has no rales.  Abdominal: Soft. She exhibits no distension. There is no tenderness.  Musculoskeletal: She exhibits edema (trace edema around ankles). She exhibits no tenderness.  Neurological: She is alert and oriented to person, place, and time.  Skin: Skin is warm and dry.  Psychiatric: She has a normal mood and affect. Her behavior is normal. Thought content normal.  Nursing note and vitals reviewed.  Assessment & Plan:  1: Chronic heart failure with preserved ejection  fraction- - NYHA class II - euvolemic today - being weighed daily at [facility]. Reminded staff member to call for an overnight weight gain of >2 pounds or a weekly weight gain of >5 pounds - weight unchanged from last visit here 6 months ago - not adding salt and staff member says that the food isn't cooked with salt either - encouraged her to elevate her legs when she sits for long periods of time - BNP from 03/02/17 was 1577.0  2: HTN- - BP initially elevated (128/104) and then improved upon recheck (120/60) - saw PCP Baldemar Lenis) 12/23/16 & now seeing PCP at [facility] - BMP from 10/15/17 reviewed and showed sodium 140, potassium 3.6, creatinine 1.09 and GFR 43   Facility medication list was reviewed.  Will not make a return appointment at this time. Advised patient and caregiver that they could call back at anytime to make another appointment.

## 2018-01-12 ENCOUNTER — Other Ambulatory Visit: Payer: Self-pay | Admitting: Family Medicine

## 2018-01-12 DIAGNOSIS — R1312 Dysphagia, oropharyngeal phase: Secondary | ICD-10-CM

## 2018-01-29 ENCOUNTER — Ambulatory Visit
Admission: RE | Admit: 2018-01-29 | Discharge: 2018-01-29 | Disposition: A | Discharge status: Home / Self Care | Payer: Medicare Other | Source: Ambulatory Visit | Attending: Family Medicine | Admitting: Family Medicine

## 2018-01-29 DIAGNOSIS — R1312 Dysphagia, oropharyngeal phase: Secondary | ICD-10-CM | POA: Diagnosis present

## 2018-01-29 NOTE — Therapy (Signed)
[place], [place], [address] Phone: [phone]   Fax:     Modified Barium Swallow  Patient Details  Name: Krystal White MRN: [id number] Date of Birth: [birth year] No data recorded  Encounter Date: 01/29/2018    Past Medical History:  Diagnosis Date  . (HFpEF) heart failure with preserved ejection fraction ([place])    a. 01/2017 Echo: Ef 55-60%, no rwma, mild AI/MR, mildly dil LA. Mod dil RV w/ mildly reduced RV fxn. Sev dil RA. Mod TR. PASP 2mmHg; b. 09/2017 Echo: Ef 65-70%, no rwma, mod AS, mild AI, triv MR, nl LA size.  . Allergic rhinitis   . Anxiety    unspecified  . Back pain    unspecified  . Cystitis   . Edema   . Embolic stroke ([place])    a. 09/2017 Acute nonhemorrhagic infarct of R inferior temproal gyrus & right superior cerebellum, w/ acute/subacute infarct of ant L pons (likely major cortical spinal tracts); b. 09/2017 Carotid U/S: 1-39% bilat ICA stenosis.  . Gait abnormality   . Hand pain   . Hearing loss    left ear  . Hematuria   . Hypertension   . Mixed hyperlipidemia   . Moderate aortic stenosis    a. 09/2017 Echo: Mod AS, mild AI. Mean grad (S): 54mmHg. Peak grad (S): 52mmHg. Valve area (VTI): 1.11cm^2, (Vmax): 1.08cm^2, (Vmean): 1.03cm^2.  . Osteoporosis, post-menopausal   . PAF (paroxysmal atrial fibrillation) (HCC)    a. CHA2DS2VASc = 6-->Eliquis.  . Polymyalgia ([place])   . Pulmonary hypertension ([place])    a. 01/2017 Echo: PASP 54mmHg.  . Skin cancer     Past Surgical History:  Procedure Laterality Date  . BREAST BIOPSY    . INGUINAL HERNIA REPAIR Right 2005  . KYPHOPLASTY N/A 04/17/2017   Procedure: [id number];  Surgeon: Hessie Knows, MD;  Location: [facility] ORS;  Service: Orthopedics;  Laterality: N/A;    There were no vitals filed for this visit.   Subjective: Patient behavior: (alertness, ability to follow instructions, etc.):  The patient is able to  verbalize her swallowing history and follow simple directions.  Chief complaint: MBS 10/15/2017: Patient presents with a severe oral dysphagia with secondary pharyngeal and esophageal components. CN deficits result in severe oral weakness, primarily right sided, with poor labial seal and lingual movement/ROM. Significant anterior labial spillage of all boluses noted, improving but not eliminated with use of tsp presentation. Loss of bolus over the base of tongue with delayed swallow initiation noted across consistencies with resultant deep penetration and silent aspiration of thin liquids intermittently. Additionally, patient with severe stasis throughout esophagus with retrograde flow of bolus into the pyriform sinuses post swallow. Note that thinner consistencies including nectar thick do aid in improved esophageal clearance but increase aspiration risk. Will initiate a conservative diet and f/u at bedside for trials of nectar thick liquid via tsp (not aspirated during the study but at risk) for hopeful upgrade.   Objective:  Radiological Procedure: A videoflouroscopic evaluation of oral-preparatory, reflex initiation, and pharyngeal phases of the swallow was performed; as well as a screening of the upper esophageal phase.  I. POSTURE: Upright in MBS chair  II. VIEW: Lateral  III. COMPENSATORY STRATEGIES: N/A  IV. BOLUSES ADMINISTERED:   Thin Liquid: 1 cup rim, 3 rapid consecutive (via straw)   Nectar-thick Liquid: 1 moderate   Honey-thick Liquid: DNT   Puree: 2 teaspoon presentations   Mechanical Soft: 1/4 graham cracker  in applesauce  V. RESULTS OF EVALUATION: A. ORAL PREPARATORY PHASE: (The lips, tongue, and velum are observed for strength and coordination)       **Overall Severity Rating: Within functional limits  B. SWALLOW INITIATION/REFLEX: (The reflex is normal if "triggered" by the time the bolus reached the base of the tongue)  **Overall Severity Rating: Mild; triggers while  falling from the valleculae to the pyriform sinuses  C. PHARYNGEAL PHASE: (Pharyngeal function is normal if the bolus shows rapid, smooth, and continuous transit through the pharynx and there is no pharyngeal residue after the swallow)  **Overall Severity Rating: Within functional limits  D. LARYNGEAL PENETRATION: (Material entering into the laryngeal inlet/vestibule but not aspirated) TRANSIENT with thin via straw   E. ASPIRATION: NONE  F. ESOPHAGEAL PHASE: (Screening of the upper esophagus): see notes from previous study above  ASSESSMENT: This 84 year old woman; S/P CVA 10/14/2017 and abnormal MBS 10/15/2017 (see above); is presenting with mild oropharyngeal dysphagia primarily characterized by delayed pharyngeal swallow initiation.  Oral control of the bolus including oral hold, rotary mastication, and anterior to posterior transfer is within functional limits.   Aspects of the pharyngeal stage of swallowing including tongue base retraction, hyolaryngeal excursion, epiglottic inversion, and duration/amplitude of UES opening are within functional limits.  There is no observed pharyngeal residue or tracheal aspiration.  The patient had TRANSIENT laryngeal penetration of thin liquid while drinking through a straw.  The patient is not at significant risk for prandial aspiration.  This study represents a significant improvement in oropharyngeal swallowing form the study 3  months ago.  Given the severity of esophageal stasis noted in the previous MBS, the patient does continue to have risk for esophagus-to-pharynx backflow.  This study supports a regular diet.  Recommend SLP clinical assessment to determine optimal diet consistency and swallowing guidelines per clinical assessment in conjunction with this study.   PLAN/RECOMMENDATIONS:   A. Diet: Per treating SLP assessment   B. Swallowing Precautions: Aspiration precautions, reflux precautions, esophageal dysmotility precautions   C. Recommended  consultation to: GI   D. Therapy recommendations: SLP bedside swallow evaluation to determine optimal diet consistency and swallowing guidelines per clinical assessment in conjunction with this study.    E. Results and recommendations were discussed with the patient immediately following the study and the final report routed to the referring MD and treating SLP   Patient will benefit from skilled therapeutic intervention in order to improve the following deficits and impairments:   Oropharyngeal dysphagia - Plan: DG OP Swallowing Func-Medicare/Speech Path, DG OP Swallowing Func-Medicare/Speech Path        Problem List Patient Active Problem List   Diagnosis Date Noted  . Acute ischemic stroke ([place]) 10/14/2017  . Protein-calorie malnutrition ([place]) 04/28/2017  . Vascular dementia ([place]) 03/26/2017  . CHF (congestive heart failure) ([place]) 03/02/2017  . Chronic diastolic heart failure ([place]) 02/13/2017  . HTN (hypertension) 02/13/2017  . Atrial fibrillation with RVR ([place]) 02/02/2017   Leroy Sea, MS/CCC- SLP  Lou Miner 01/29/2018, 2:57 PM  [facility], [place], [address] Phone: [phone]   Fax:     Name: MAYAH URQUIDI MRN: [id number] Date of Birth: [birth year]

## 2018-02-26 NOTE — Progress Notes (Deleted)
[facility] [address] [place]. [place] [address] [phone]       OFFICE FOLLOW UP NOTE  [name] Date of Birth:  January 01, 1925 Medical Record Number:  [id number]   Reason for Referral:  hospital stroke follow up  CHIEF COMPLAINT:  No chief complaint on file.  Interval history 03/01/2018: Patient is being seen today for 18-month follow-up visit after embolic infarct secondary to atrial fibrillation on 10/14/2017.  She continues on Eliquis without side effects of bleeding or bruising.  She continues to follow regularly with her cardiologist.  Continues on simvastatin without side effects myalgias.  Blood pressure today ***.  No further concerns at this time.  Denies new or worsening stroke/TIA symptoms.  11/24/2017 visit: Patient is being seen today for hospital follow up and is accompanied by SNF CNA.Marland Kitchen She is currently residing at [facility] and is participating in PT/OT/ST for continued right hemiparesis and dysarthria.  She does state that both dysarthria and right hemiparesis have been improving.  She is currently using a wheelchair for long distance but is able to ambulate with rolling walker for short distance during PT.  She continues to take Eliquis without side effects of bleeding or bruising.  Continues to take simvastatin without side effects of myalgias.  Blood pressure today elevated at 150/58 but does state at facility is typically lower.  She is currently wearing 2 L O2 [place] ATC.  She did have follow-up appointment with ENT as recommended at hospital discharge at [facility] on 11/03/2017 and did have large amount of wax removed per patient and CNA.  Denies new or worsening stroke/TIA symptoms.  HPI: Krystal White is being seen today for initial visit in the office for right inferior temporal gyrus and right superior cerebellar infarcts secondary to atrial fibrillation on 10/14/2017. History obtained from patient, CNA and chart review. Reviewed all  radiology images and labs personally.  [name] is a 85 y.o. female with history of PAF on eliquis, CHF, HTN, pleural effusion, asthma, pulm HTN, HLD who presented with dysarthria and listing to the right.  CT head reviewed and showed small recent right temporal lobe infarct along with soft tissue and left external auditory canal with left vascular deficiency.  MRI head reviewed and showed right inferior temporal gyrus and right superior cerebellar infarcts with acute/subacute anterior left pontine infarct and left external auditory canal EAC cholesteatoma.  MRA normal.  Carotid Doppler showed bilateral ICA stenosis of 1 to 39%.  2D echo showed an EF of 65 to 70%.  LDL 125 and A1c 5.4.  Was recommended to continue Eliquis along with increasing simvastatin dose of 40 mg daily.  Patient was discharged to SNF for continued rehab.       ROS:   14 system review of systems performed and negative with exception of weakness and speech difficulty  PMH:  Past Medical History:  Diagnosis Date  . (HFpEF) heart failure with preserved ejection fraction ([place])    a. 01/2017 Echo: Ef 55-60%, no rwma, mild AI/MR, mildly dil LA. Mod dil RV w/ mildly reduced RV fxn. Sev dil RA. Mod TR. PASP 22mmHg; b. 09/2017 Echo: Ef 65-70%, no rwma, mod AS, mild AI, triv MR, nl LA size.  . Allergic rhinitis   . Anxiety    unspecified  . Back pain    unspecified  . Cystitis   . Edema   . Embolic stroke ([place])    a. 09/2017 Acute nonhemorrhagic infarct of R  inferior temproal gyrus & right superior cerebellum, w/ acute/subacute infarct of ant L pons (likely major cortical spinal tracts); b. 09/2017 Carotid U/S: 1-39% bilat ICA stenosis.  . Gait abnormality   . Hand pain   . Hearing loss    left ear  . Hematuria   . Hypertension   . Mixed hyperlipidemia   . Moderate aortic stenosis    a. 09/2017 Echo: Mod AS, mild AI. Mean grad (S): 35mmHg. Peak grad (S): 36mmHg. Valve area (VTI): 1.11cm^2, (Vmax): 1.08cm^2,  (Vmean): 1.03cm^2.  . Osteoporosis, post-menopausal   . PAF (paroxysmal atrial fibrillation) (H[facility])    a. CHA2DS2VASc = 6-->Eliquis.  . Polymyalgia ([place])   . Pulmonary hypertension ([place])    a. 01/2017 Echo: PASP 70mmHg.  . Skin cancer     PSH:  Past Surgical History:  Procedure Laterality Date  . BREAST BIOPSY    . INGUINAL HERNIA REPAIR Right 2005  . KYPHOPLASTY N/A 04/17/2017   Procedure: [id number];  Surgeon: Hessie Knows, MD;  Location: [facility] ORS;  Service: Orthopedics;  Laterality: N/A;    Social History:  Social History   Socioeconomic History  . Marital status: Widowed    Spouse name: Not on file  . Number of children: 1  . Years of education: 44  . Highest education level: High school graduate  Occupational History  . Not on file  Social Needs  . Financial resource strain: Not hard at all  . Food insecurity:    Worry: Patient refused    Inability: Patient refused  . Transportation needs:    Medical: Yes    Non-medical: Yes  Tobacco Use  . Smoking status: Former Smoker    Packs/day: 1.00    Years: 25.00    Pack years: 25.00    Types: Cigarettes    Last attempt to quit: 02/24/1978    Years since quitting: 40.0  . Smokeless tobacco: Never Used  Substance and Sexual Activity  . Alcohol use: No  . Drug use: No  . Sexual activity: Never  Lifestyle  . Physical activity:    Days per week: 4 days    Minutes per session: 10 min  . Stress: Rather much  Relationships  . Social connections:    Talks on phone: More than three times a week    Gets together: Three times a week    Attends religious service: More than 4 times per year    Active member of club or organization: No    Attends meetings of clubs or organizations: Never    Relationship status: Widowed  . Intimate partner violence:    Fear of current or ex partner: No    Emotionally abused: No    Physically abused: No    Forced sexual activity: No  Other Topics Concern  . Not on file  Social  History Narrative   Former smoker   No alcohol use   Widowed   1 child   DNR    Family History:  Family History  Problem Relation Age of Onset  . Heart failure Sister   . Cancer Sister   . Aneurysm Mother   . Colon cancer Father   . Heart attack Brother   . Cancer Sister     Medications:   Current Outpatient Medications on File Prior to Visit  Medication Sig Dispense Refill  . albuterol (PROVENTIL HFA;VENTOLIN HFA) 108 (90 Base) MCG/ACT inhaler Inhale 2 puffs into the lungs every 6 (six) hours as needed for wheezing or shortness  of breath.    . Amino Acids-Protein Hydrolys (FEEDING SUPPLEMENT, PRO-STAT SUGAR FREE 64,) LIQD Take 30 mLs by mouth 2 (two) times daily between meals.     Marland Kitchen amiodarone (PACERONE) 200 MG tablet Take 1 tablet (200 mg total) by mouth daily. 180 tablet 0  . apixaban (ELIQUIS) 2.5 MG TABS tablet Take 1 tablet (2.5 mg total) by mouth 2 (two) times daily. 60 tablet 1  . azelastine (ASTELIN) 0.1 % nasal spray Place 2 sprays into both nostrils 2 (two) times daily. Use in each nostril as directed    . budesonide-formoterol (SYMBICORT) 160-4.5 MCG/ACT inhaler Inhale 2 puffs into the lungs 2 (two) times daily. 1 Inhaler 12  . carboxymethylcellulose (REFRESH PLUS) 0.5 % SOLN Place 1 drop into both eyes 2 (two) times daily.    Marland Kitchen donepezil (ARICEPT) 10 MG tablet Take 10 mg by mouth at bedtime.    . fluticasone (FLONASE) 50 MCG/ACT nasal spray Place 2 sprays into the nose daily.     . furosemide (LASIX) 40 MG tablet Take 1 tablet (40 mg total) by mouth daily. 30 tablet 0  . metoprolol tartrate (LOPRESSOR) 25 MG tablet Take 0.5 tablets (12.5 mg total) by mouth 2 (two) times daily. 60 tablet 0  . potassium chloride (MICRO-K) 10 MEQ CR capsule Take 10 mEq by mouth daily.    . simvastatin (ZOCOR) 40 MG tablet Take 1 tablet (40 mg total) by mouth daily at 6 PM. 30 tablet 0   No current facility-administered medications on file prior to visit.     Allergies:  No Known  Allergies   Physical Exam  There were no vitals filed for this visit. There is no height or weight on file to calculate BMI. No exam data present  General: well developed, well nourished, pleasant elderly Caucasian female, seated, in no evident distress Head: head normocephalic and atraumatic.   Neck: supple with no carotid or supraclavicular bruits Cardiovascular: regular rate and rhythm, no murmurs Musculoskeletal: no deformity Skin:  no rash/petichiae Vascular:  Normal pulses all extremities  Neurologic Exam Mental Status: Awake and fully alert.  Mild dysarthria noted.  Oriented to place and time. Recent and remote memory intact. Attention span, concentration and fund of knowledge appropriate. Mood and affect appropriate.  Cranial Nerves: Fundoscopic exam reveals sharp disc margins. Pupils equal, briskly reactive to light. Extraocular movements full without nystagmus. Visual fields full to confrontation.  HOH. Facial sensation intact. Face, tongue, palate moves normally and symmetrically.  Motor: Normal bulk and tone.  RUE and RLE: 4/5; normal strength in left upper and lower extremity Sensory.: intact to touch , pinprick , position and vibratory sensation.  Coordination: Rapid alternating movements normal in all extremities. Finger-to-nose and heel-to-shin performed accurately bilaterally.  Orbits left arm over right arm.  Decreased right hand dexterity. Gait and Station: Patient currently sitting in wheelchair and does not have rolling walker available during appointment therefore assessment deferred Reflexes: 1+ and symmetric. Toes downgoing.    NIHSS  1 Modified [place]  3 HAS-BLED 2 CHA2DS2-VASc 6   Diagnostic Data (Labs, Imaging, Testing)  CT HEAD WO CONTRAST 10/14/2017 IMPRESSION: 1. Small recent appearing infarct in the superficial and inferior right temporal lobe. 2. No left-sided infarct is seen in this patient with right-sided weakness. 3. Soft tissue fills the  left external auditory canal and there is left ossicular deficiency. Need visual inspection to differentiate cerumen from cholesteatoma.  MR BRAIN WO CONTRAST MR MRA HEAD  10/14/2017 IMPRESSION: 1. Acute nonhemorrhagic infarcts  involving the right inferior temporal gyrus and right superior cerebellum. 2. Acute/subacute nonhemorrhagic infarct involving the anterior left pons, likely major cortical spinal tracts. 3. Diffuse white matter disease likely reflects the sequela of chronic microvascular ischemia. 4. Normal variant MRA circle-of-Willis without significant proximal stenosis, aneurysm, or branch vessel occlusion.  ECHOCARDIOGRAM 10/15/2017 Impressions: - Compared to a study in 01/2017, the LVEF is higher at 65-70%,   There is now moderate aortic stenosis and mild AI with a mean   gradient of 13 mmHg.  VAS US CAROTID DUPLEX BILATERAL 10/15/2017 Right Carotid: Velocities in the right ICA are consistent with a 1-39% stenosis. Left Carotid: Velocities in the left ICA are consistent with a 1-39% stenosis. Vertebrals: Bilateral vertebral arteries demonstrate antegrade flow. Subclavians: Normal flow hemodynamics were seen in bilateral subclavian arteries.    ASSESSMENT: Krystal White is a 85 y.o. year old female here with right inferior temporal gyrus and right superior cerebellum infarcts on 10/14/2017 secondary to known AF on Eliquis. Vascular risk factors include PAF on Eliquis, CHF, HTN, and HLD.  Patient is being seen today for hospital follow-up and continues to have deficits of dysarthria and right hemiparesis but overall has been improving and stable from stroke standpoint.    PLAN: -Continue Eliquis (apixaban) daily  and simvastatin for secondary stroke prevention -F/u with PCP regarding your HLD and HTN management -F/U with cardiology for continued AF and Eliquis management -F/U with ENT as recommended for need of follow-up/monitoring -Continue PT/OT/ST for  continued deficits -continue to monitor BP at home -advised to continue to stay active and maintain a healthy diet -Maintain strict control of hypertension with blood pressure goal below 130/90, diabetes with hemoglobin A1c goal below 6.5% and cholesterol with LDL cholesterol (bad cholesterol) goal below 70 mg/dL. I also advised the patient to eat a healthy diet with plenty of whole grains, cereals, fruits and vegetables, exercise regularly and maintain ideal body weight.  Follow up in 3 months or call earlier if needed   Greater than 50% of time during this 25 minute visit was spent on counseling,explanation of diagnosis of right inferior temporal gyrus and right superior cerebellum infarcts, reviewing risk factor management of PAF, CHF, HTN and HLD, planning of further management, discussion with patient and family and coordination of care    Venancio Poisson, AGNP-BC  [facility] [address] [place],  [address]  Phone [phone] Fax [phone] Note: This document was prepared with digital dictation and possible smart phrase technology. Any transcriptional errors that result from this process are unintentional.

## 2018-03-01 ENCOUNTER — Telehealth: Payer: Self-pay

## 2018-03-01 ENCOUNTER — Ambulatory Visit: Payer: Medicare Other | Admitting: Adult Health

## 2018-03-01 NOTE — Telephone Encounter (Signed)
Patient no show for appt today. 

## 2018-03-02 ENCOUNTER — Encounter: Payer: Self-pay | Admitting: Adult Health

## 2018-03-05 ENCOUNTER — Encounter: Payer: Self-pay | Admitting: Adult Health

## 2018-03-05 ENCOUNTER — Ambulatory Visit: Payer: Medicare Other | Admitting: Adult Health

## 2018-03-05 VITALS — BP 141/68 | HR 60 | Ht 61.0 in

## 2018-03-05 DIAGNOSIS — I63431 Cerebral infarction due to embolism of right posterior cerebral artery: Secondary | ICD-10-CM | POA: Diagnosis not present

## 2018-03-05 DIAGNOSIS — I480 Paroxysmal atrial fibrillation: Secondary | ICD-10-CM | POA: Diagnosis not present

## 2018-03-05 DIAGNOSIS — E785 Hyperlipidemia, unspecified: Secondary | ICD-10-CM

## 2018-03-05 DIAGNOSIS — I10 Essential (primary) hypertension: Secondary | ICD-10-CM

## 2018-03-05 NOTE — Progress Notes (Signed)
[facility] [address] [place]. [place] [address] [phone]       OFFICE FOLLOW UP NOTE  [name] Date of Birth:  1924-04-10 Medical Record Number:  [id number]   Reason for Referral:  hospital stroke follow up  CHIEF COMPLAINT:  Chief Complaint  Patient presents with  . Follow-up    3 month follow up. Guardianship Education officer, museum. Treatment room. No new concerns at this time. Patient had a barium swallow test done a month ago and she is able to eat a little more.    Interval history 03/05/2018: Patient is being seen today for 11-month follow-up visit after embolic infarct secondary to atrial fibrillation on 10/14/2017.  She is being accompanied today by her guardianship Education officer, museum.  Currently residing at [facility].  She did undergo barium swallow study on 01/29/2018 which showed significant improvement in oropharyngeal swallowing from prior study 3.5 months ago.  She reports continued right-sided weakness and speech difficulties.  Unable to determine if she continues with therapies at facility.  She continues on Eliquis without side effects of bleeding or bruising.  She continues to follow regularly with her cardiologist.  Continues on simvastatin without side effects myalgias.  Blood pressure today 141/68.  No further concerns at this time.  Denies new or worsening stroke/TIA symptoms.  11/24/2017 visit: Patient is being seen today for hospital follow up and is accompanied by SNF CNA.Marland Kitchen She is currently residing at [facility] and is participating in PT/OT/ST for continued right hemiparesis and dysarthria.  She does state that both dysarthria and right hemiparesis have been improving.  She is currently using a wheelchair for long distance but is able to ambulate with rolling walker for short distance during PT.  She continues to take Eliquis without side effects of bleeding or bruising.  Continues to take simvastatin without side effects of myalgias.  Blood pressure  today elevated at 150/58 but does state at facility is typically lower.  She is currently wearing 2 L O2 McDuffie ATC.  She did have follow-up appointment with ENT as recommended at hospital discharge at [facility] on 11/03/2017 and did have large amount of wax removed per patient and CNA.  Denies new or worsening stroke/TIA symptoms.  HPI: Krystal White is being seen today for initial visit in the office for right inferior temporal gyrus and right superior cerebellar infarcts secondary to atrial fibrillation on 10/14/2017. History obtained from patient, CNA and chart review. Reviewed all radiology images and labs personally.  Krystal White is a 85 y.o. female with history of PAF on eliquis, CHF, HTN, pleural effusion, asthma, pulm HTN, HLD who presented with dysarthria and listing to the right.  CT head reviewed and showed small recent right temporal lobe infarct along with soft tissue and left external auditory canal with left vascular deficiency.  MRI head reviewed and showed right inferior temporal gyrus and right superior cerebellar infarcts with acute/subacute anterior left pontine infarct and left external auditory canal EAC cholesteatoma.  MRA normal.  Carotid Doppler showed bilateral ICA stenosis of 1 to 39%.  2D echo showed an EF of 65 to 70%.  LDL 125 and A1c 5.4.  Was recommended to continue Eliquis along with increasing simvastatin dose of 40 mg daily.  Patient was discharged to SNF for continued rehab.       ROS:   14 system review of systems performed and negative with exception of weakness and speech difficulty  PMH:  Past Medical History:  Diagnosis Date  . (HFpEF) heart failure with preserved ejection fraction ([place])    a. 01/2017 Echo: Ef 55-60%, no rwma, mild AI/MR, mildly dil LA. Mod dil RV w/ mildly reduced RV fxn. Sev dil RA. Mod TR. PASP 26mmHg; b. 09/2017 Echo: Ef 65-70%, no rwma, mod AS, mild AI, triv MR, nl LA size.  . Allergic rhinitis   . Anxiety     unspecified  . Back pain    unspecified  . Cystitis   . Edema   . Embolic stroke ([place])    a. 09/2017 Acute nonhemorrhagic infarct of R inferior temproal gyrus & right superior cerebellum, w/ acute/subacute infarct of ant L pons (likely major cortical spinal tracts); b. 09/2017 Carotid U/S: 1-39% bilat ICA stenosis.  . Gait abnormality   . Hand pain   . Hearing loss    left ear  . Hematuria   . Hypertension   . Mixed hyperlipidemia   . Moderate aortic stenosis    a. 09/2017 Echo: Mod AS, mild AI. Mean grad (S): 55mmHg. Peak grad (S): 9mmHg. Valve area (VTI): 1.11cm^2, (Vmax): 1.08cm^2, (Vmean): 1.03cm^2.  . Osteoporosis, post-menopausal   . PAF (paroxysmal atrial fibrillation) (HCC)    a. CHA2DS2VASc = 6-->Eliquis.  . Polymyalgia ([place])   . Pulmonary hypertension ([place])    a. 01/2017 Echo: PASP 38mmHg.  . Skin cancer     PSH:  Past Surgical History:  Procedure Laterality Date  . BREAST BIOPSY    . INGUINAL HERNIA REPAIR Right 2005  . KYPHOPLASTY N/A 04/17/2017   Procedure: [id number];  Surgeon: Hessie Knows, MD;  Location: [facility] ORS;  Service: Orthopedics;  Laterality: N/A;    Social History:  Social History   Socioeconomic History  . Marital status: Widowed    Spouse name: Not on file  . Number of children: 1  . Years of education: 5  . Highest education level: High school graduate  Occupational History  . Not on file  Social Needs  . Financial resource strain: Not hard at all  . Food insecurity:    Worry: Patient refused    Inability: Patient refused  . Transportation needs:    Medical: Yes    Non-medical: Yes  Tobacco Use  . Smoking status: Former Smoker    Packs/day: 1.00    Years: 25.00    Pack years: 25.00    Types: Cigarettes    Last attempt to quit: 02/24/1978    Years since quitting: 40.0  . Smokeless tobacco: Never Used  Substance and Sexual Activity  . Alcohol use: No  . Drug use: No  . Sexual activity: Never  Lifestyle  . Physical activity:     Days per week: 4 days    Minutes per session: 10 min  . Stress: Rather much  Relationships  . Social connections:    Talks on phone: More than three times a week    Gets together: Three times a week    Attends religious service: More than 4 times per year    Active member of club or organization: No    Attends meetings of clubs or organizations: Never    Relationship status: Widowed  . Intimate partner violence:    Fear of current or ex partner: No    Emotionally abused: No    Physically abused: No    Forced sexual activity: No  Other Topics Concern  . Not on file  Social History Narrative   Former smoker   No alcohol use  Widowed   1 child   DNR    Family History:  Family History  Problem Relation Age of Onset  . Heart failure Sister   . Cancer Sister   . Aneurysm Mother   . Colon cancer Father   . Heart attack Brother   . Cancer Sister     Medications:   Current Outpatient Medications on File Prior to Visit  Medication Sig Dispense Refill  . albuterol (PROVENTIL HFA;VENTOLIN HFA) 108 (90 Base) MCG/ACT inhaler Inhale 2 puffs into the lungs every 6 (six) hours as needed for wheezing or shortness of breath.    Marland Kitchen amiodarone (PACERONE) 200 MG tablet Take 1 tablet (200 mg total) by mouth daily. 180 tablet 0  . apixaban (ELIQUIS) 2.5 MG TABS tablet Take 1 tablet (2.5 mg total) by mouth 2 (two) times daily. 60 tablet 1  . azelastine (ASTELIN) 0.1 % nasal spray Place 2 sprays into both nostrils 2 (two) times daily. Use in each nostril as directed    . budesonide-formoterol (SYMBICORT) 160-4.5 MCG/ACT inhaler Inhale 2 puffs into the lungs 2 (two) times daily. 1 Inhaler 12  . carboxymethylcellulose (REFRESH PLUS) 0.5 % SOLN Place 1 drop into both eyes 2 (two) times daily.    Marland Kitchen donepezil (ARICEPT) 10 MG tablet Take 10 mg by mouth at bedtime.    . fluticasone (FLONASE) 50 MCG/ACT nasal spray Place 2 sprays into both nostrils daily.    . furosemide (LASIX) 40 MG tablet Take 1  tablet (40 mg total) by mouth daily. 30 tablet 0  . metoprolol tartrate (LOPRESSOR) 25 MG tablet Take 0.5 tablets (12.5 mg total) by mouth 2 (two) times daily. 60 tablet 0  . NON FORMULARY Take by mouth 2 (two) times daily. [employer]    . OXYGEN Inhale 2 L into the lungs continuous.    . potassium chloride (MICRO-K) 10 MEQ CR capsule Take 10 mEq by mouth daily.    . simvastatin (ZOCOR) 40 MG tablet Take 40 mg by mouth daily.    . Amino Acids-Protein Hydrolys (FEEDING SUPPLEMENT, PRO-STAT SUGAR FREE 64,) LIQD Take 30 mLs by mouth 2 (two) times daily between meals.      No current facility-administered medications on file prior to visit.     Allergies:  No Known Allergies   Physical Exam  Vitals:   03/05/18 0934  BP: (!) 141/68  Pulse: 60  Height: 5\' 1"  (1.549 m)   Body mass index is 20.78 kg/m. No exam data present  General: Frail, pleasant elderly Caucasian female, seated, in no evident distress Head: head normocephalic and atraumatic.   Neck: supple with no carotid or supraclavicular bruits Cardiovascular: regular rate and rhythm, no murmurs Musculoskeletal: no deformity Skin:  no rash/petichiae Vascular:  Normal pulses all extremities  Neurologic Exam Mental Status: Awake and fully alert.  Mild dysarthria noted.  Oriented to place and time. Recent and remote memory intact. Attention span, concentration and fund of knowledge appropriate. Mood and affect appropriate.  Cranial Nerves: Fundoscopic exam reveals sharp disc margins. Pupils equal, briskly reactive to light. Extraocular movements full without nystagmus. Visual fields full to confrontation.  HOH. Facial sensation intact. Face, tongue, palate moves normally and symmetrically.  Motor: Normal bulk and tone.  RUE and RLE: 4/5; normal strength in left upper and lower extremity Sensory.: intact to touch , pinprick , position and vibratory sensation.  Coordination: Rapid alternating movements normal in all extremities.  Finger-to-nose and heel-to-shin performed accurately bilaterally.  Orbits left arm over  right arm.  Decreased right hand dexterity. Gait and Station: Patient currently sitting in wheelchair stating she is nonambulatory Reflexes: 1+ and symmetric. Toes downgoing.      Diagnostic Data (Labs, Imaging, Testing)  CT HEAD WO CONTRAST 10/14/2017 IMPRESSION: 1. Small recent appearing infarct in the superficial and inferior right temporal lobe. 2. No left-sided infarct is seen in this patient with right-sided weakness. 3. Soft tissue fills the left external auditory canal and there is left ossicular deficiency. Need visual inspection to differentiate cerumen from cholesteatoma.  MR BRAIN WO CONTRAST MR MRA HEAD  10/14/2017 IMPRESSION: 1. Acute nonhemorrhagic infarcts involving the right inferior temporal gyrus and right superior cerebellum. 2. Acute/subacute nonhemorrhagic infarct involving the anterior left pons, likely major cortical spinal tracts. 3. Diffuse white matter disease likely reflects the sequela of chronic microvascular ischemia. 4. Normal variant MRA circle-of-Willis without significant proximal stenosis, aneurysm, or branch vessel occlusion.  ECHOCARDIOGRAM 10/15/2017 Impressions: - Compared to a study in 01/2017, the LVEF is higher at 65-70%,   There is now moderate aortic stenosis and mild AI with a mean   gradient of 13 mmHg.  VAS US CAROTID DUPLEX BILATERAL 10/15/2017 Right Carotid: Velocities in the right ICA are consistent with a 1-39% stenosis. Left Carotid: Velocities in the left ICA are consistent with a 1-39% stenosis. Vertebrals: Bilateral vertebral arteries demonstrate antegrade flow. Subclavians: Normal flow hemodynamics were seen in bilateral subclavian arteries.    ASSESSMENT: Krystal White is a 85 y.o. year old female here with right inferior temporal gyrus and right superior cerebellum infarcts on 10/14/2017 secondary to known AF on Eliquis.  Vascular risk factors include PAF on Eliquis, CHF, HTN, and HLD.  Patient is being seen today for follow-up visit and has been stable from a stroke standpoint with residual right hemiparesis and dysarthria.    PLAN: -Continue Eliquis (apixaban) daily  and simvastatin for secondary stroke prevention -F/u with PCP regarding your HLD and HTN management -F/U with cardiology for continued AF and Eliquis management -Recommend continuation of PT/OT/ST for continued deficits -continue to monitor BP at home -advised to continue to stay active and maintain a healthy diet -Maintain strict control of hypertension with blood pressure goal below 130/90, diabetes with hemoglobin A1c goal below 6.5% and cholesterol with LDL cholesterol (bad cholesterol) goal below 70 mg/dL. I also advised the patient to eat a healthy diet with plenty of whole grains, cereals, fruits and vegetables, exercise regularly and maintain ideal body weight.  Follow up in 6 months or call earlier if needed   Greater than 50% of time during this 25 minute visit was spent on counseling,explanation of diagnosis of right inferior temporal gyrus and right superior cerebellum infarcts, reviewing risk factor management of PAF, CHF, HTN and HLD, planning of further management, discussion with patient and family and coordination of care    Venancio Poisson, AGNP-BC  [facility] [address] [place], [place] [address]  Phone [phone] Fax [phone] Note: This document was prepared with digital dictation and possible smart phrase technology. Any transcriptional errors that result from this process are unintentional.

## 2018-03-05 NOTE — Patient Instructions (Signed)
Continue Eliquis (apixaban) daily  and simvastatin  for secondary stroke prevention  Continue to follow up with PCP regarding cholesterol and blood pressure management   Continue to follow with cardiologist in regards to atrial fibrillation and eliquis management  Continue therapies at facility for continued right hemiparesis and dysarthria  Continue to monitor blood pressure at home  Maintain strict control of hypertension with blood pressure goal below 130/90, diabetes with hemoglobin A1c goal below 6.5% and cholesterol with LDL cholesterol (bad cholesterol) goal below 70 mg/dL. I also advised the patient to eat a healthy diet with plenty of whole grains, cereals, fruits and vegetables, exercise regularly and maintain ideal body weight.  Followup in the future with me in 6 months or call earlier if needed       Thank you for coming to see [place] at [facility]. I hope we have been able to provide you high quality care today.  You may receive a patient satisfaction survey over the next few weeks. We would appreciate your feedback and comments so that we may continue to improve ourselves and the health of our patients.

## 2018-03-12 NOTE — Progress Notes (Signed)
I agree with the above plan 

## 2018-04-01 ENCOUNTER — Encounter: Payer: Self-pay | Admitting: Nurse Practitioner

## 2018-04-01 ENCOUNTER — Ambulatory Visit (INDEPENDENT_AMBULATORY_CARE_PROVIDER_SITE_OTHER): Payer: Medicare Other | Admitting: Nurse Practitioner

## 2018-04-01 VITALS — BP 130/58 | HR 54 | Ht 61.0 in | Wt 112.4 lb

## 2018-04-01 DIAGNOSIS — I480 Paroxysmal atrial fibrillation: Secondary | ICD-10-CM | POA: Diagnosis not present

## 2018-04-01 DIAGNOSIS — Z8673 Personal history of transient ischemic attack (TIA), and cerebral infarction without residual deficits: Secondary | ICD-10-CM

## 2018-04-01 DIAGNOSIS — E782 Mixed hyperlipidemia: Secondary | ICD-10-CM

## 2018-04-01 DIAGNOSIS — I10 Essential (primary) hypertension: Secondary | ICD-10-CM

## 2018-04-01 DIAGNOSIS — I350 Nonrheumatic aortic (valve) stenosis: Secondary | ICD-10-CM | POA: Diagnosis not present

## 2018-04-01 DIAGNOSIS — I5032 Chronic diastolic (congestive) heart failure: Secondary | ICD-10-CM

## 2018-04-01 MED ORDER — AMIODARONE HCL 100 MG PO TABS: 100.0000 mg | ORAL_TABLET | Freq: Every day | ORAL | 3 refills | Status: DC

## 2018-04-01 NOTE — Progress Notes (Signed)
Office Visit    Patient Name: Krystal White Date of Encounter: 04/01/2018  Primary Care Provider:  Juluis Pitch, MD Primary Cardiologist:  Kathlyn Sacramento, MD  Chief Complaint    85 year old female with history of hypertension, hyperlipidemia, HFpEF, paroxysmal atrial fibrillation on Eliquis, pulmonary hypertension, osteoporosis, polymyalgia, and hearing loss, who presents for follow-up related to A. fib and heart failure.  Past Medical History    Past Medical History:  Diagnosis Date  . (HFpEF) heart failure with preserved ejection fraction ([place])    a. 01/2017 Echo: Ef 55-60%, no rwma, mild AI/MR, mildly dil LA. Mod dil RV w/ mildly reduced RV fxn. Sev dil RA. Mod TR. PASP 17mmHg; b. 09/2017 Echo: Ef 65-70%, no rwma, mod AS, mild AI, triv MR, nl LA size.  . Allergic rhinitis   . Anxiety    unspecified  . Back pain    unspecified  . Cystitis   . Edema   . Embolic stroke ([place])    a. 09/2017 Acute nonhemorrhagic infarct of R inferior temproal gyrus & right superior cerebellum, w/ acute/subacute infarct of ant L pons (likely major cortical spinal tracts); b. 09/2017 Carotid U/S: 1-39% bilat ICA stenosis.  . Gait abnormality   . Hand pain   . Hearing loss    left ear  . Hematuria   . Hypertension   . Mixed hyperlipidemia   . Moderate aortic stenosis    a. 09/2017 Echo: Mod AS, mild AI. Mean grad (S): 33mmHg. Peak grad (S): 51mmHg. Valve area (VTI): 1.11cm^2, (Vmax): 1.08cm^2, (Vmean): 1.03cm^2.  . Osteoporosis, post-menopausal   . PAF (paroxysmal atrial fibrillation) (HCC)    a. CHA2DS2VASc = 6-->Eliquis.  . Polymyalgia ([place])   . Pulmonary hypertension ([place])    a. 01/2017 Echo: PASP 85mmHg.  . Skin cancer    Past Surgical History:  Procedure Laterality Date  . BREAST BIOPSY    . INGUINAL HERNIA REPAIR Right 2005  . KYPHOPLASTY N/A 04/17/2017   Procedure: [id number];  Surgeon: Hessie Knows, MD;  Location: [facility] ORS;  Service: Orthopedics;  Laterality: N/A;     Allergies  No Known Allergies  History of Present Illness    85 year old female with above complex past medical history including HFpEF, hypertension, hyperlipidemia, paroxysmal atrial fibrillation on Eliquis, pulmonary hypertension, osteoporosis, polymyalgia, hearing loss, and recent stroke.  She was diagnosed with atrial fibrillation in the setting of diastolic heart failure December 2018, at which time echocardiogram showed normal LV function.  In August 2019, she was admitted to [facility] secondary to acute nonhemorrhagic infarct of the right inferior temporal gyrus and right superior cerebellum as well as acute/subacute nonhemorrhagic infarct of the anterior left pons.  Carotid Doppler showed insignificant disease.  Echo showed normal LV function with moderate aortic stenosis.  Stroke was felt to be embolic in nature.  She was on Eliquis and in sinus rhythm on admission and after discussion with neurology, family opted to continue Eliquis therapy.  She was last seen in cardiology clinic in October.  She continues to live in a skilled nursing facility and reports that overall, she has done well.  She is very sedentary, spending most of her day either in bed or in a wheelchair.  She denies any dyspnea on exertion, chest pain, palpitations, PND, orthopnea, dizziness, syncope, edema, or early satiety.  She has had some trouble with swallowing and has previously been evaluated by speech therapy there.  She reports a good appetite.  Home Medications    Prior  to Admission medications   Medication Sig Start Date End Date Taking? Authorizing Provider  albuterol (PROVENTIL HFA;VENTOLIN HFA) 108 (90 Base) MCG/ACT inhaler Inhale 2 puffs into the lungs every 6 (six) hours as needed for wheezing or shortness of breath.    [provider]  Amino Acids-Protein Hydrolys (FEEDING SUPPLEMENT, PRO-STAT SUGAR FREE 64,) LIQD Take 30 mLs by mouth 2 (two) times daily between meals.     [provider]  amiodarone (PACERONE) 200 MG tablet Take 1 tablet (200 mg total) by mouth daily. 03/07/17   Salary, Avel Peace, MD  apixaban (ELIQUIS) 2.5 MG TABS tablet Take 1 tablet (2.5 mg total) by mouth 2 (two) times daily. 02/07/17   Henreitta Leber, MD  azelastine (ASTELIN) 0.1 % nasal spray Place 2 sprays into both nostrils 2 (two) times daily. Use in each nostril as directed    [provider]  budesonide-formoterol (SYMBICORT) 160-4.5 MCG/ACT inhaler Inhale 2 puffs into the lungs 2 (two) times daily. 03/06/17   Salary, Avel Peace, MD  carboxymethylcellulose (REFRESH PLUS) 0.5 % SOLN Place 1 drop into both eyes 2 (two) times daily.    [provider]  donepezil (ARICEPT) 10 MG tablet Take 10 mg by mouth at bedtime.    [provider]  fluticasone (FLONASE) 50 MCG/ACT nasal spray Place 2 sprays into both nostrils daily.    [provider]  furosemide (LASIX) 40 MG tablet Take 1 tablet (40 mg total) by mouth daily. 03/07/17   Salary, Avel Peace, MD  metoprolol tartrate (LOPRESSOR) 25 MG tablet Take 0.5 tablets (12.5 mg total) by mouth 2 (two) times daily. 03/06/17   Salary, Avel Peace, MD  NON FORMULARY Take by mouth 2 (two) times daily. [employer]    [provider]  OXYGEN Inhale 2 L into the lungs continuous.    [provider]  potassium chloride (MICRO-K) 10 MEQ CR capsule Take 10 mEq by mouth daily.    [provider]  simvastatin (ZOCOR) 40 MG tablet Take 40 mg by mouth daily.    [provider]    Review of Systems    She denies chest pain, palpitations, dyspnea, pnd, orthopnea, n, v, dizziness, syncope, edema, weight gain, or early satiety.  She does report some dysphasia and occasionally getting choked up on food and liquids.  All other systems reviewed and are otherwise negative except as noted above.  Physical Exam    VS:  BP (!) 130/58 (BP Location: Left Arm, Patient Position: Sitting, Cuff Size: Normal)    Pulse (!) 54   Ht 5\' 1"  (1.549 m)   Wt 112 lb 6 oz (51 kg) Comment: weight checked at [facility] on 03/11/2018.  SpO2 94%   BMI 21.23 kg/m  , BMI Body mass index is 21.23 kg/m. GEN: Well nourished, well developed, in no acute distress. HEENT: normal. Neck: Supple, no JVD, carotid bruits, or masses. Cardiac: RRR, 2/6 systolic ejection murmur at the upper sternal borders, no rubs, or gallops. No clubbing, cyanosis, edema.  Radials/PT 2+ and equal bilaterally.  Respiratory:  Respirations regular and unlabored, clear to auscultation bilaterally. GI: Soft, nontender, nondistended, BS + x 4. MS: no deformity or atrophy. Skin: warm and dry, no rash. Neuro:  Strength and sensation are intact. Psych: Normal affect.  Accessory Clinical Findings    ECG personally reviewed by me today -sinus bradycardia, 54, prolonged QT with a QTC of 515 ms- no acute changes.  Assessment & Plan  1.  Paroxysmal atrial fibrillation: Maintaining sinus rhythm on amiodarone.  She is anticoagulated with low-dose Eliquis in the setting of age greater than 22 and weight of 51 kg.  Her QTC is prolonged at 515 ms.  I will reduce her amiodarone to 100 mg daily.  Follow-up CBC, complete metabolic panel, magnesium, and TSH today.  Continue beta-blocker.  2.  History of embolic stroke: Continues to have dysphasia especially of drinking thin liquids.  She has previous been evaluated by speech therapy and it appears that she may require repeat evaluation.  She remains on Eliquis therapy in the setting of prior history of paroxysmal atrial fibrillation.  3.  HFpEF: Euvolemic on examination.  Heart rate and blood pressure stable.  Continue current regimen.  4.  Moderate aortic stenosis: Asymptomatic.  Continue beta-blocker.  5.  Hyperlipidemia: Continue statin therapy.  6.  Essential hypertension: Stable.  7.  Disposition: Follow-up labs today in the setting of ongoing amiodarone and Eliquis therapy.  Follow-up in clinic  in 4 months or sooner if necessary.   Murray Hodgkins, NP 04/01/2018, 4:55 PM

## 2018-04-01 NOTE — Patient Instructions (Signed)
Medication Instructions:  Your physician has recommended you make the following change in your medication:  1- DECREASE Amiodarone to 1 tablet (100 mg total) once daily   If you need a refill on your cardiac medications before your next appointment, please call your pharmacy.   Lab work: Your physician recommends that you return for lab work today (CMET, MAG, CBC, TSH)  If you have labs (blood work) drawn today and your tests are completely normal, you will receive your results only by: Marland Kitchen MyChart Message (if you have MyChart) OR . A paper copy in the mail If you have any lab test that is abnormal or we need to change your treatment, we will call you to review the results.  Testing/Procedures: None ordered   Follow-Up: At [facility], you and your health needs are our priority.  As part of our continuing mission to provide you with exceptional heart care, we have created designated Provider Care Teams.  These Care Teams include your primary Cardiologist (physician) and Advanced Practice Providers (APPs -  Physician Assistants and Nurse Practitioners) who all work together to provide you with the care you need, when you need it. You will need a follow up appointment in 4-6 months.  Please call our office 2 months in advance to schedule this appointment.  You may see Kathlyn Sacramento, MD or Pecolia Ades, NP

## 2018-04-02 ENCOUNTER — Telehealth: Payer: Self-pay

## 2018-04-02 LAB — COMPREHENSIVE METABOLIC PANEL
ALT: 24 IU/L (ref 0–32)
AST: 24 IU/L (ref 0–40)
Albumin/Globulin Ratio: 1.2 (ref 1.2–2.2)
Albumin: 3.6 g/dL (ref 3.5–4.6)
Alkaline Phosphatase: 89 IU/L (ref 39–117)
BUN/Creatinine Ratio: 22 (ref 12–28)
BUN: 22 mg/dL (ref 10–36)
Bilirubin Total: 0.6 mg/dL (ref 0.0–1.2)
CALCIUM: 9.1 mg/dL (ref 8.7–10.3)
CO2: 28 mmol/L (ref 20–29)
CREATININE: 1 mg/dL (ref 0.57–1.00)
Chloride: 95 mmol/L — ABNORMAL LOW (ref 96–106)
GFR calc Af Amer: 57 mL/min/{1.73_m2} — ABNORMAL LOW (ref >59)
GFR, EST NON AFRICAN AMERICAN: 49 mL/min/{1.73_m2} — AB (ref >59)
GLUCOSE: 102 mg/dL — AB (ref 65–99)
Globulin, Total: 3 g/dL (ref 1.5–4.5)
POTASSIUM: 4.1 mmol/L (ref 3.5–5.2)
Sodium: 142 mmol/L (ref 134–144)
Total Protein: 6.6 g/dL (ref 6.0–8.5)

## 2018-04-02 LAB — CBC
HEMATOCRIT: 40.3 % (ref 34.0–46.6)
HEMOGLOBIN: 13.5 g/dL (ref 11.1–15.9)
MCH: 31.8 pg (ref 26.6–33.0)
MCHC: 33.5 g/dL (ref 31.5–35.7)
MCV: 95 fL (ref 79–97)
PLATELETS: 206 10*3/uL (ref 150–450)
RBC: 4.24 x10E6/uL (ref 3.77–5.28)
RDW: 12.5 % (ref 11.7–15.4)
WBC: 6 10*3/uL (ref 3.4–10.8)

## 2018-04-02 LAB — TSH: TSH: 4.53 u[IU]/mL — ABNORMAL HIGH (ref 0.450–4.500)

## 2018-04-02 LAB — MAGNESIUM: Magnesium: 1.9 mg/dL (ref 1.6–2.3)

## 2018-04-02 NOTE — Telephone Encounter (Signed)
call to [facility], pts residence. Spoke to nurse, [employer]. RN made aware of need for lab draw, free T4. She requested that I fax order to [phone]. Earliest lab draw would be today or tomorrow.   She agreed to return lab work when resulted.   Advised pt to call for any further questions or concerns.  Order faxed to provided number.

## 2018-04-02 NOTE — Telephone Encounter (Signed)
-----   Message from Theora Gianotti, NP sent at 04/02/2018  8:29 AM EST ----- Kidney fxn, lytes, liver enzymes, and blood counts ok.  TSH very mildly elevated - she should have a FT4.  She is @ a [facility]sg home, perhaps they can draw there and fax [place] results.

## 2018-04-07 NOTE — Telephone Encounter (Signed)
Call made to [facility], spoke with RN to confirm lab was drawn last week. She was unsure at the time but would check into it and send it over by fax when available.   Fax number provided.

## 2018-04-14 NOTE — Telephone Encounter (Signed)
Spoke to [employer], Therapist, sports. She reported that labs were drawn at facility as requested and she believed that the unit manager was going to fax them to [place].  She reported that she would check on it and ensure that labs values be delivered to [place].

## 2018-04-14 NOTE — Telephone Encounter (Signed)
[facility] made aware.

## 2018-04-14 NOTE — Telephone Encounter (Signed)
Correction- lab has been received and provider has viewed. Encounter completed at this time.   No further orders needed.

## 2018-05-12 IMAGING — CT CT L SPINE W/O CM
3 series · 9 of 35 positions shown, 10 images · non-contrast
Comparison: 03/02/2017 CT chest. 03/30/2012 lumbar spine
radiographs. 04/11/2017 chest radiograph.

CLINICAL DATA: [AGE]/o  F; 2 days of back pain.

EXAM:
CT THORACIC AND LUMBAR SPINE WITHOUT CONTRAST
TECHNIQUE: Multidetector CT imaging of the thoracic and lumbar spine was
performed without contrast. Multiplanar CT image reconstructions
were also generated.

[Series 3: l spine soft · axial · 0.36mm/px · z∈[-491,-491]mm · 1 of 107 slices shown, 2 images]
[im 58/107  soft-tissue]
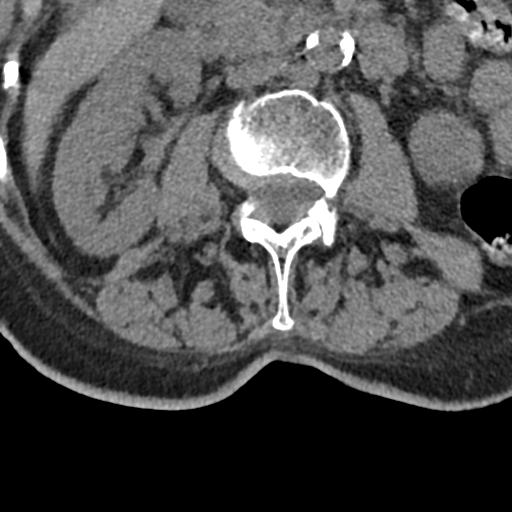
[im 58/107  bone]
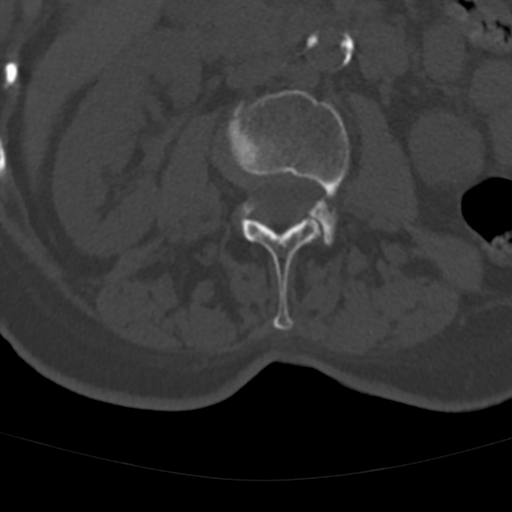

[Series 4: sagittal bone · sagittal · 0.20mm/px · 5 of 59 slices shown]
[im 20/59  bone]
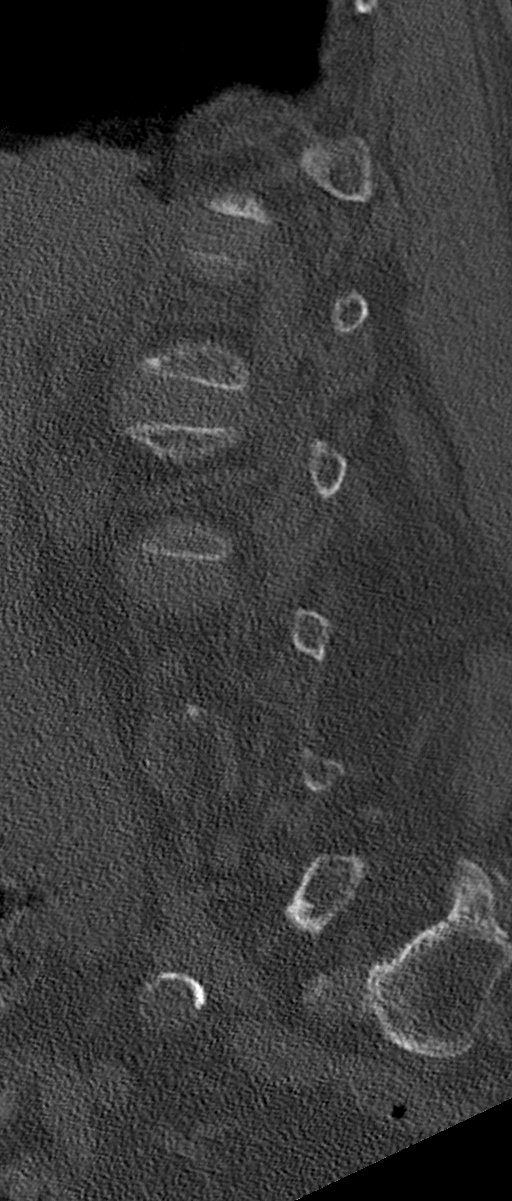
[im 25/59  bone]
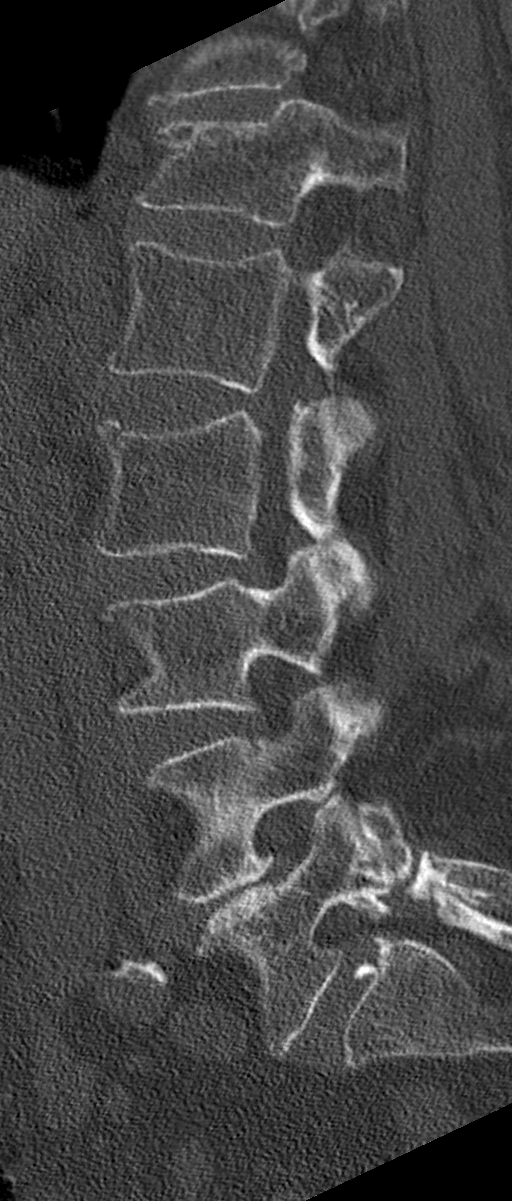
[im 30/59  bone]
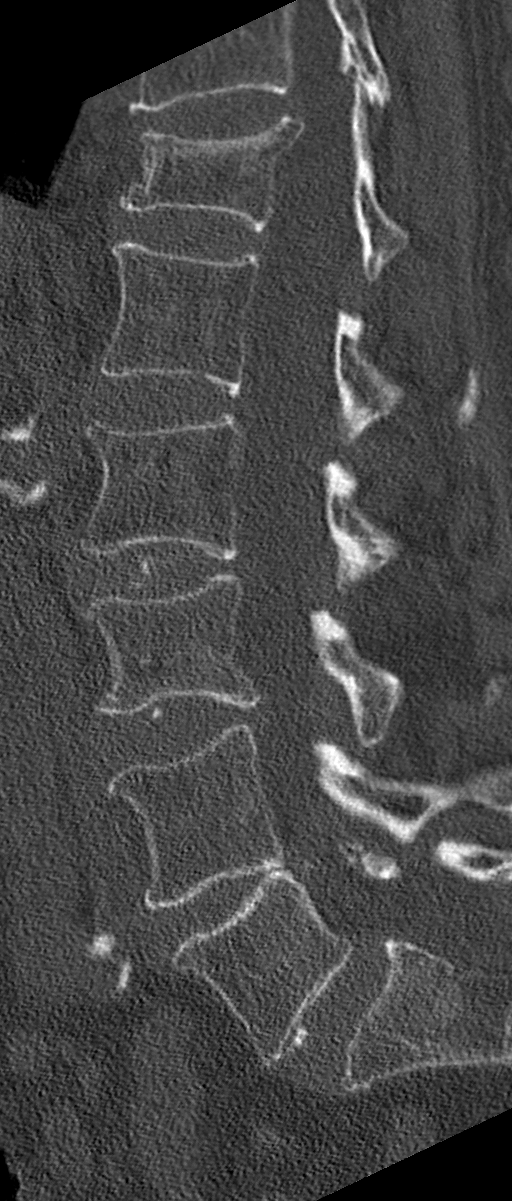
[im 34/59  bone]
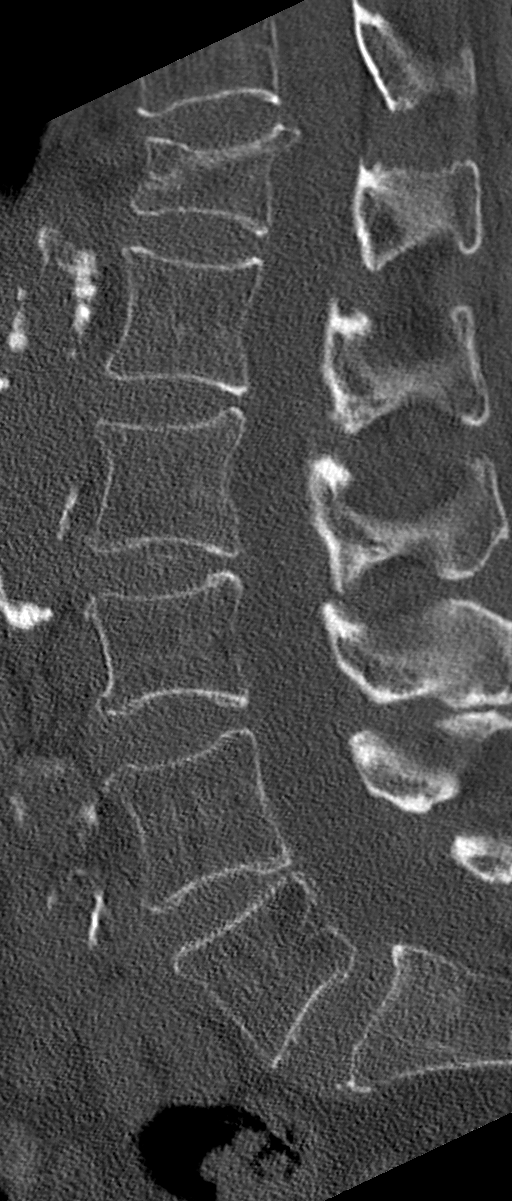
[im 39/59  bone]
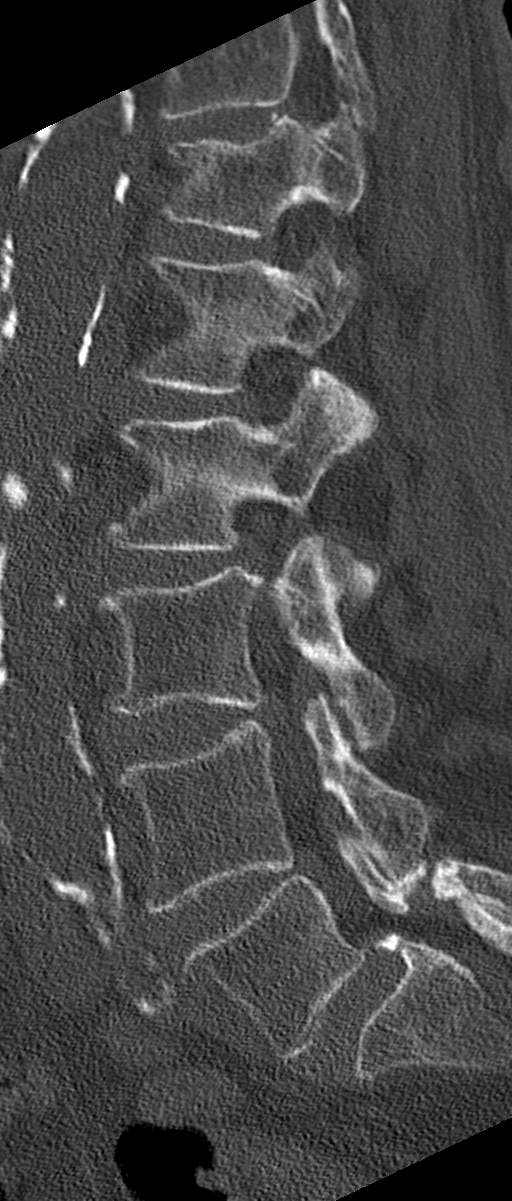

[Series 5: coronal bone · coronal · 0.27mm/px · 3 of 51 slices shown]
[im 11/51  bone]
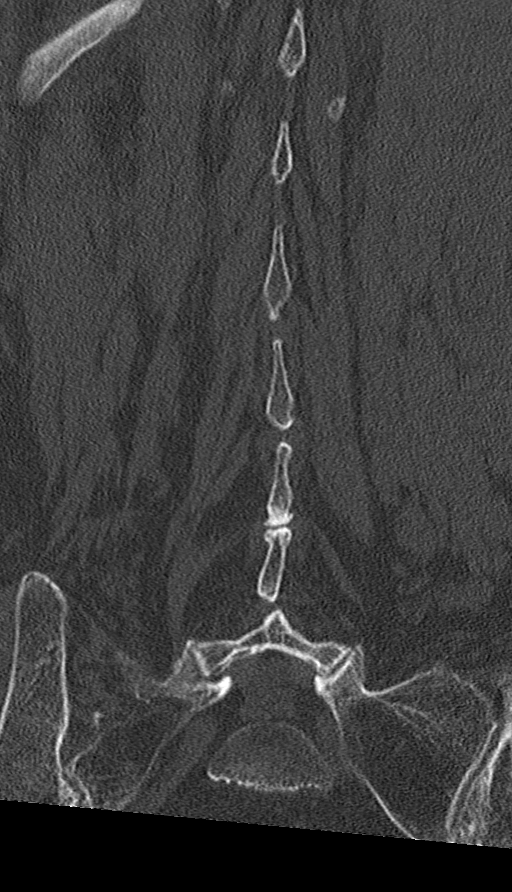
[im 21/51  bone]
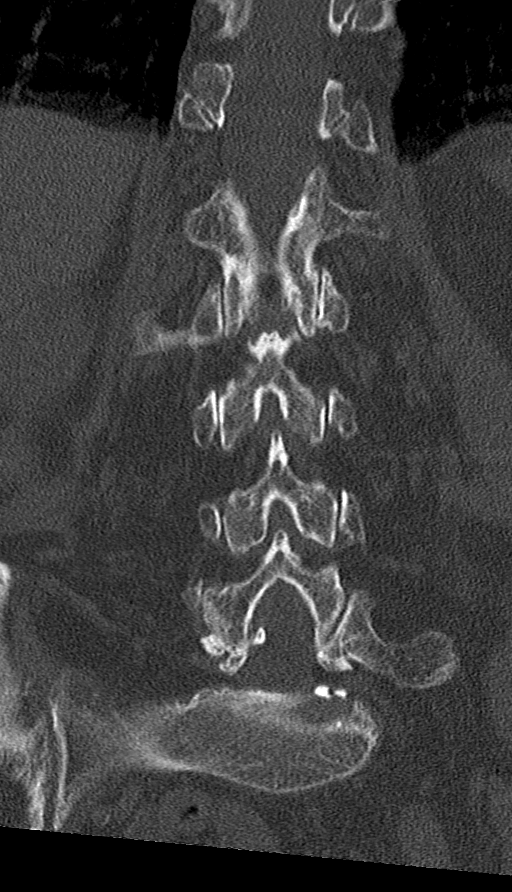
[im 31/51  bone]
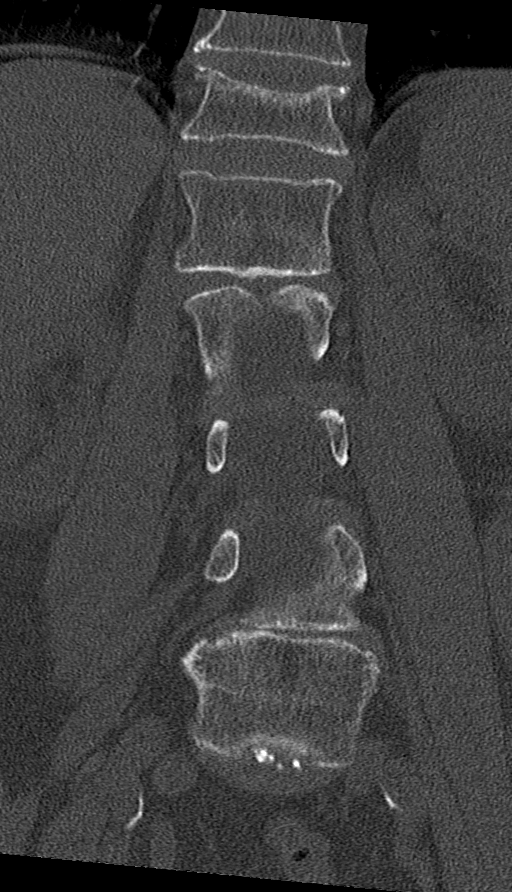

[9 of 35 positions shown; findings below may reference images not displayed]

FINDINGS: CT THORACIC SPINE FINDINGS

Alignment: Normal thoracic kyphosis.

Vertebrae: Stable T3 and T4 superior endplate chronic fractures with
mild loss of vertebral body height. Stable T8 vertebral body
compression fracture with severe loss of height and minimal
retropulsion of inferior endplate. New T12 vertebral body superior
endplate fracture with moderate 50% loss of vertebral body height
and 4 mm bony retropulsion of superior endplate mild associated
canal stenosis. Bones are demineralized. No lytic or blastic lesion
identified..

Paraspinal and other soft tissues: Extensive respiratory motion
artifact of visualized lung fields. Moderate centrilobular emphysema
with upper lobe predominance. Biapical pleuroparenchymal scarring
with calcifications. Scattered mucous impaction of airways. Severe
calcific atherosclerosis of normal caliber thoracic aorta. Enlarged
main pulmonary artery measuring 3.4 cm. Moderate to severe coronary
artery calcification.

Disc levels: Mild multilevel discogenic degenerative changes with
anterior marginal osteophytes and disc space narrowing. No
high-grade bony foraminal or canal stenosis.

CT LUMBAR SPINE FINDINGS

Segmentation: 5 lumbar type vertebrae.

Alignment: Stable grade 1 L5-S1 anterolisthesis with associated
chronic L5 pars defects bilaterally. Otherwise normal lumbar
lordosis. Mild S-shaped curvature with rightward apex at L1 and
leftward apex at L4.

Vertebrae: No acute fracture or focal pathologic process.

Paraspinal and other soft tissues: Left kidney upper pole
subcentimeter hemorrhagic cyst.

Disc levels: Mild loss of intervertebral disc space height at the
L4-5 level and multilevel facet arthrosis. Small disc bulges
throughout the lumbar spine. No high-grade foraminal or canal
stenosis.
IMPRESSION: CT THORACIC SPINE IMPRESSION

1. T12 superior endplate fracture with moderate 50% loss of
vertebral body height, new from 03/02/2017. Mild retropulsion of
superior endplate with mild associated bony canal stenosis.
2. Stable mild T3, mild T4, and severe T8 chronic compression
deformities.
3. No expansile lytic or blastic lesion identified. Bones are
demineralized.

CT LUMBAR SPINE IMPRESSION

1. No acute fracture or dislocation.
2. Stable mild S-shaped curvature, L5-S1 grade 1 anterolisthesis,
and chronic L5 pars defects.
3. Stable mild lumbar spondylosis. No high-grade foraminal or canal
stenosis identified

By: Omae Rateb M.D.

## 2018-05-12 IMAGING — CT CT T SPINE W/O CM
3 of 6 series · 15 of 33 positions shown, 18 images · non-contrast
Comparison: 03/02/2017 CT chest. 03/30/2012 lumbar spine
radiographs. 04/11/2017 chest radiograph.

CLINICAL DATA: [AGE]/o  F; 2 days of back pain.

EXAM:
CT THORACIC AND LUMBAR SPINE WITHOUT CONTRAST
TECHNIQUE: Multidetector CT imaging of the thoracic and lumbar spine was
performed without contrast. Multiplanar CT image reconstructions
were also generated.

[Series 4: t spine soft · axial · 0.36mm/px · z∈[-440,-196]mm · 9 of 154 slices shown, 12 images]
[im 16/154  soft-tissue]
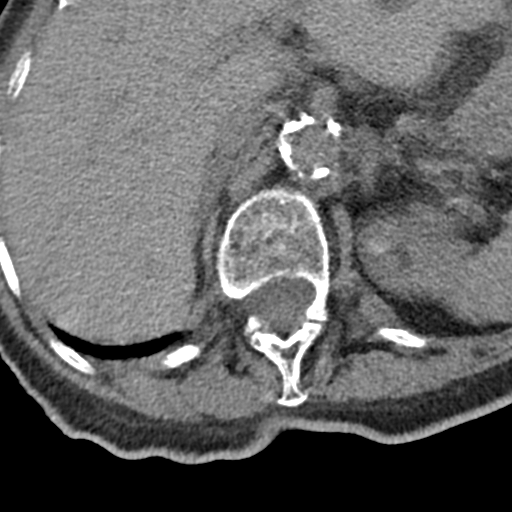
[im 16/154  bone]
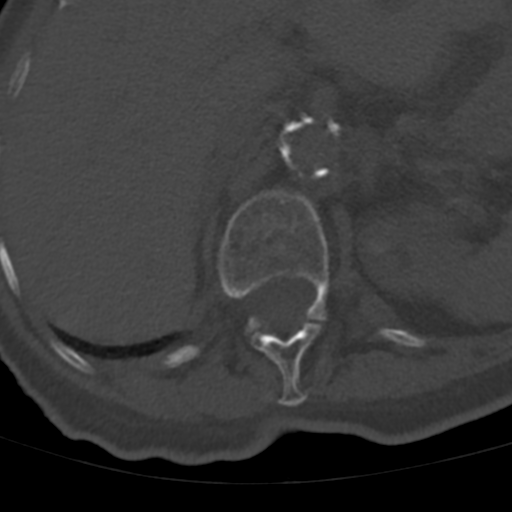
[im 31/154  bone]
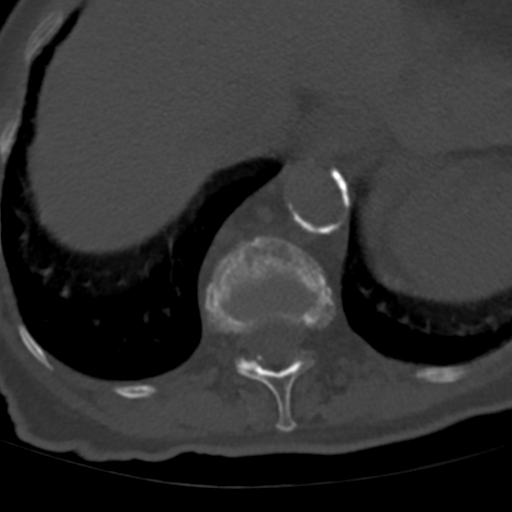
[im 46/154  bone]
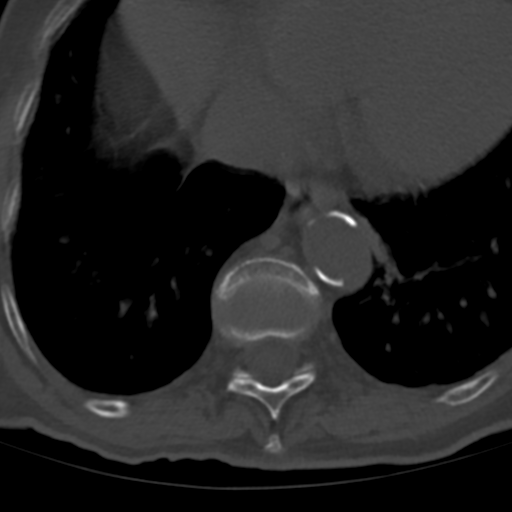
[im 62/154  bone]
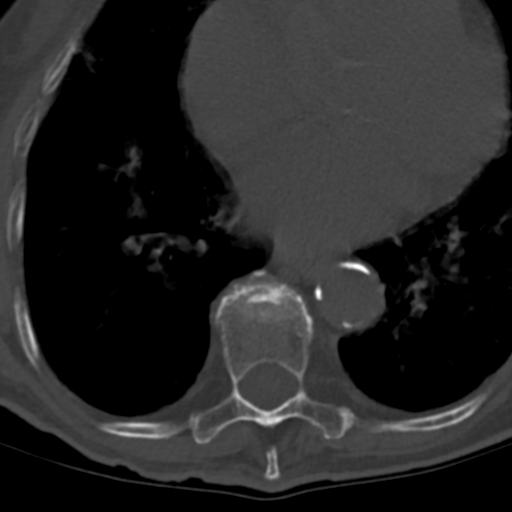
[im 77/154  soft-tissue]
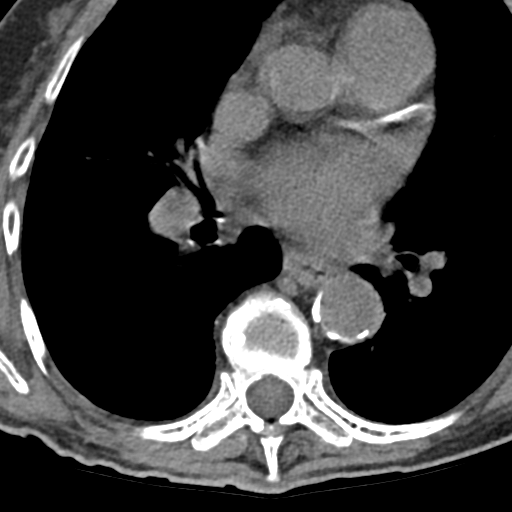
[im 77/154  bone]
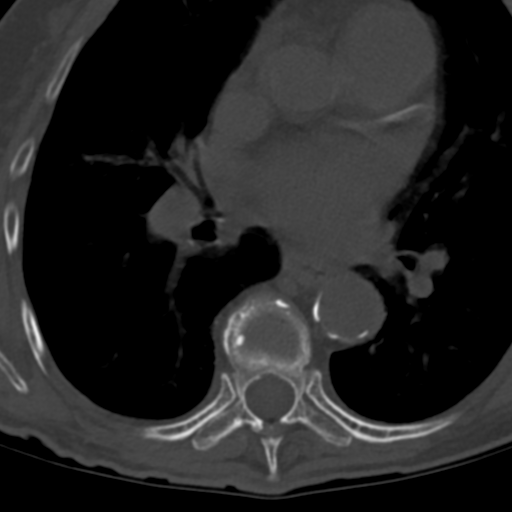
[im 92/154  bone]
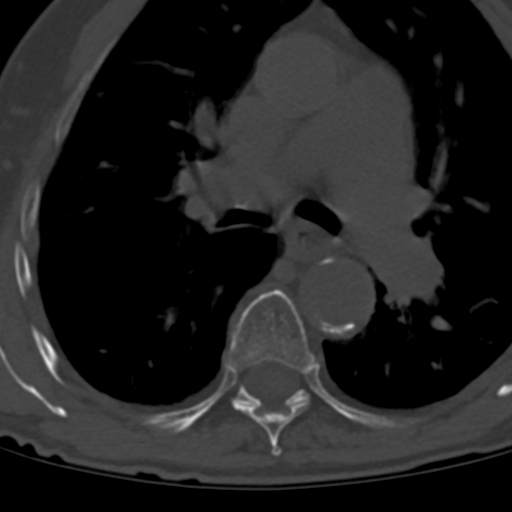
[im 108/154  bone]
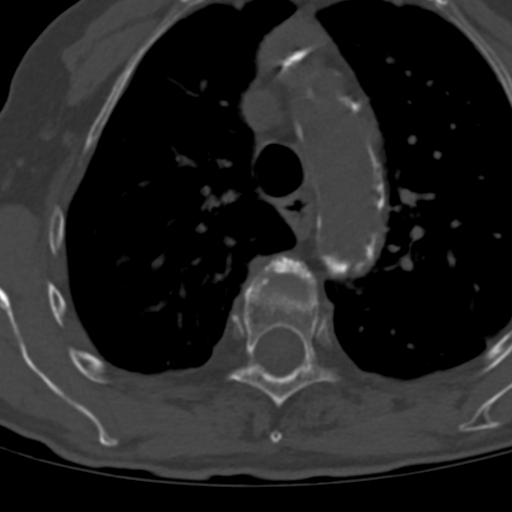
[im 123/154  bone]
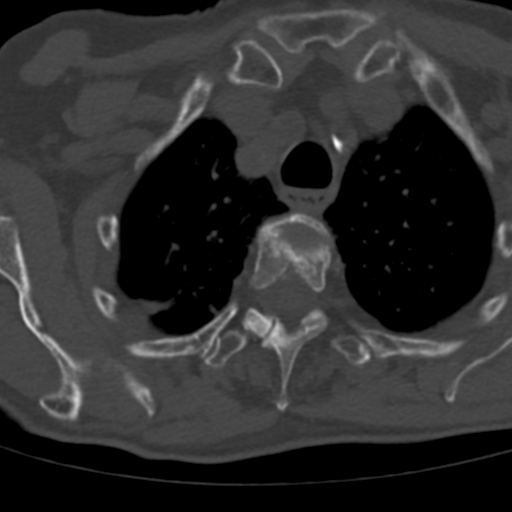
[im 138/154  soft-tissue]
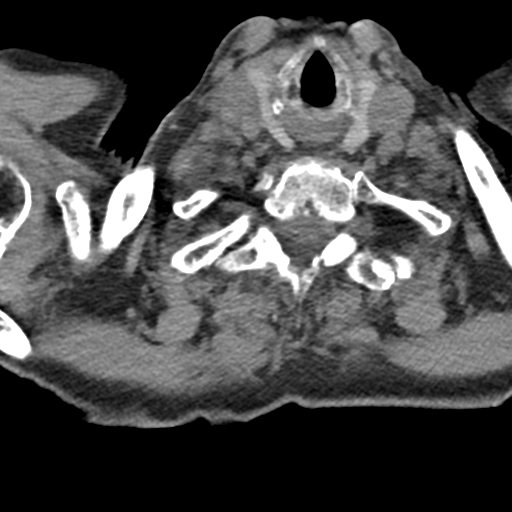
[im 138/154  bone]
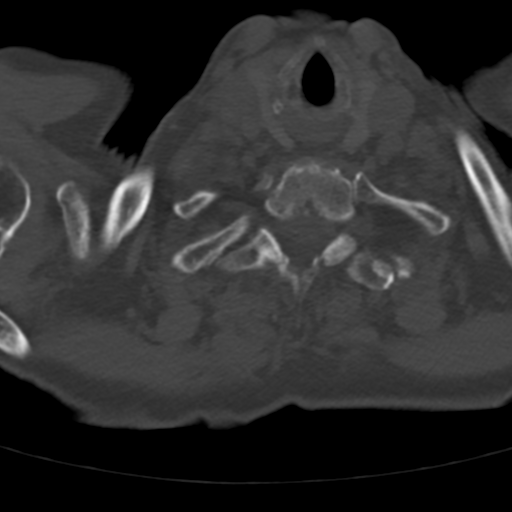

[Series 11: sagittal bone · sagittal · 0.24mm/px · 5 of 45 slices shown]
[im 7/45  bone]
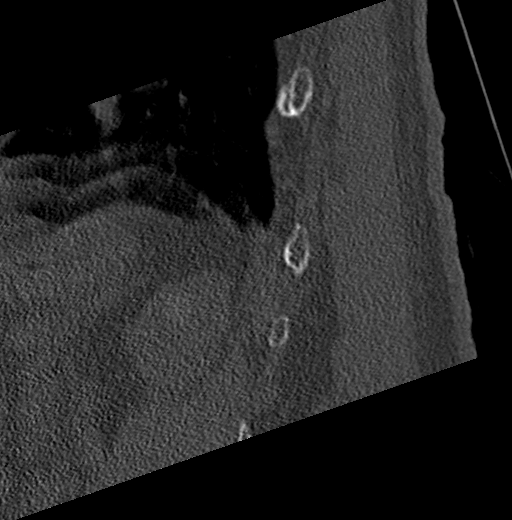
[im 13/45  bone]
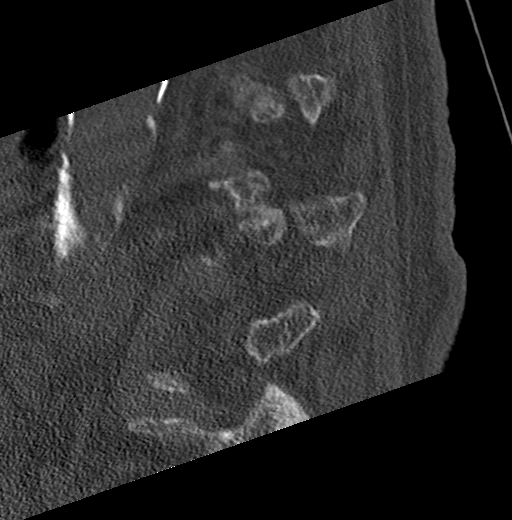
[im 19/45  bone]
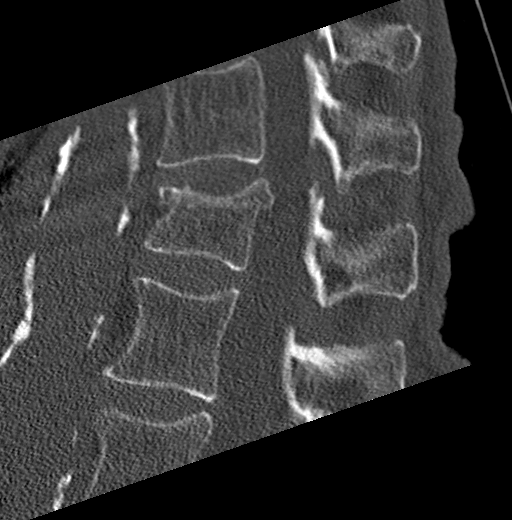
[im 26/45  bone]
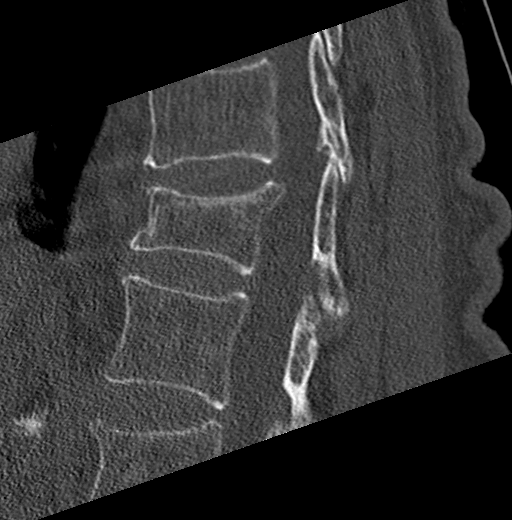
[im 32/45  bone]
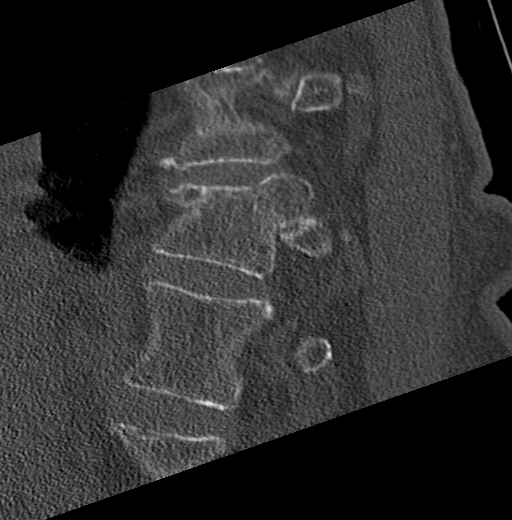

[Series 12: coronal bone · coronal · 0.21mm/px · 1 of 49 slices shown]
[im 25/49  bone]
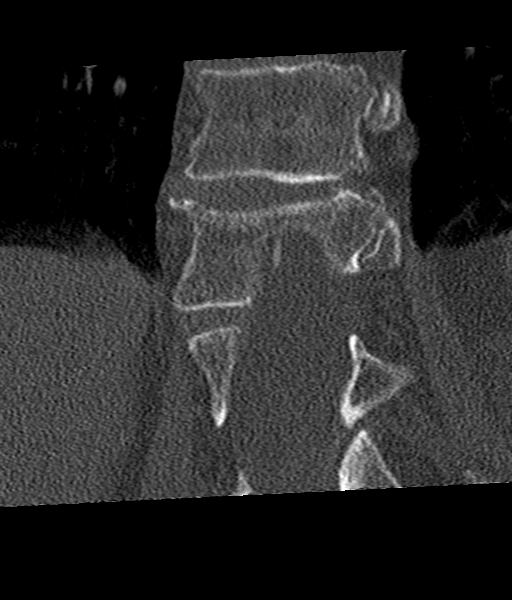

[15 of 33 positions shown; findings below may reference images not displayed]

FINDINGS: CT THORACIC SPINE FINDINGS

Alignment: Normal thoracic kyphosis.

Vertebrae: Stable T3 and T4 superior endplate chronic fractures with
mild loss of vertebral body height. Stable T8 vertebral body
compression fracture with severe loss of height and minimal
retropulsion of inferior endplate. New T12 vertebral body superior
endplate fracture with moderate 50% loss of vertebral body height
and 4 mm bony retropulsion of superior endplate mild associated
canal stenosis. Bones are demineralized. No lytic or blastic lesion
identified..

Paraspinal and other soft tissues: Extensive respiratory motion
artifact of visualized lung fields. Moderate centrilobular emphysema
with upper lobe predominance. Biapical pleuroparenchymal scarring
with calcifications. Scattered mucous impaction of airways. Severe
calcific atherosclerosis of normal caliber thoracic aorta. Enlarged
main pulmonary artery measuring 3.4 cm. Moderate to severe coronary
artery calcification.

Disc levels: Mild multilevel discogenic degenerative changes with
anterior marginal osteophytes and disc space narrowing. No
high-grade bony foraminal or canal stenosis.

CT LUMBAR SPINE FINDINGS

Segmentation: 5 lumbar type vertebrae.

Alignment: Stable grade 1 L5-S1 anterolisthesis with associated
chronic L5 pars defects bilaterally. Otherwise normal lumbar
lordosis. Mild S-shaped curvature with rightward apex at L1 and
leftward apex at L4.

Vertebrae: No acute fracture or focal pathologic process.

Paraspinal and other soft tissues: Left kidney upper pole
subcentimeter hemorrhagic cyst.

Disc levels: Mild loss of intervertebral disc space height at the
L4-5 level and multilevel facet arthrosis. Small disc bulges
throughout the lumbar spine. No high-grade foraminal or canal
stenosis.
IMPRESSION: CT THORACIC SPINE IMPRESSION

1. T12 superior endplate fracture with moderate 50% loss of
vertebral body height, new from 03/02/2017. Mild retropulsion of
superior endplate with mild associated bony canal stenosis.
2. Stable mild T3, mild T4, and severe T8 chronic compression
deformities.
3. No expansile lytic or blastic lesion identified. Bones are
demineralized.

CT LUMBAR SPINE IMPRESSION

1. No acute fracture or dislocation.
2. Stable mild S-shaped curvature, L5-S1 grade 1 anterolisthesis,
and chronic L5 pars defects.
3. Stable mild lumbar spondylosis. No high-grade foraminal or canal
stenosis identified

By: Omae Rateb M.D.

## 2018-07-27 ENCOUNTER — Telehealth: Payer: Self-pay | Admitting: Cardiovascular Disease

## 2018-07-27 NOTE — Telephone Encounter (Signed)
Virtual Visit Pre-Appointment Phone Call  "(Name), I am calling you today to discuss your upcoming appointment. We are currently trying to limit exposure to the virus that causes COVID-19 by seeing patients at home rather than in the office."  1. "What is the BEST phone number to call the day of the visit?" - include this in appointment notes  2. Do you have or have access to (through a family member/friend) a smartphone with video capability that we can use for your visit?" a. If yes - list this number in appt notes as cell (if different from BEST phone #) and list the appointment type as a VIDEO visit in appointment notes b. If no - list the appointment type as a PHONE visit in appointment notes  3. Confirm consent - "In the setting of the current Covid19 crisis, you are scheduled for a (phone or video) visit with your provider on (date) at (time).  Just as we do with many in-office visits, in order for you to participate in this visit, we must obtain consent.  If you'd like, I can send this to your mychart (if signed up) or email for you to review.  Otherwise, I can obtain your verbal consent now.  All virtual visits are billed to your insurance company just like a normal visit would be.  By agreeing to a virtual visit, we'd like you to understand that the technology does not allow for your provider to perform an examination, and thus may limit your provider's ability to fully assess your condition. If your provider identifies any concerns that need to be evaluated in person, we will make arrangements to do so.  Finally, though the technology is pretty good, we cannot assure that it will always work on either your or our end, and in the setting of a video visit, we may have to convert it to a phone-only visit.  In either situation, we cannot ensure that we have a secure connection.  Are you willing to proceed?" STAFF: Did the patient verbally acknowledge consent to telehealth visit? Document  YES/NO here: YES  4. Advise patient to be prepared - "Two hours prior to your appointment, go ahead and check your blood pressure, pulse, oxygen saturation, and your weight (if you have the equipment to check those) and write them all down. When your visit starts, your provider will ask you for this information. If you have an Apple Watch or Kardia device, please plan to have heart rate information ready on the day of your appointment. Please have a pen and paper handy nearby the day of the visit as well."  5. Give patient instructions for MyChart download to smartphone OR [email or web address] as below if video visit (depending on what platform provider is using)  6. Inform patient they will receive a phone call 15 minutes prior to their appointment time (may be from unknown caller ID) so they should be prepared to answer    TELEPHONE CALL NOTE  ANQUINETTE PIERRO has been deemed a candidate for a follow-up tele-health visit to limit community exposure during the Covid-19 pandemic. I spoke with the patient via phone to ensure availability of phone/video source, confirm preferred email & phone number, and discuss instructions and expectations.  I reminded Krystal White to be prepared with any vital sign and/or heart rhythm information that could potentially be obtained via home monitoring, at the time of her visit. I reminded Krystal White to expect a phone call prior to  her visit.  Ace Gins 07/27/2018 8:55 AM   INSTRUCTIONS FOR DOWNLOADING THE MYCHART APP TO SMARTPHONE  - The patient must first make sure to have activated MyChart and know their login information - If Apple, go to [employer] and type in MyChart in the search bar and download the app. If Android, ask patient to go to [employer] and type in [place] in the search bar and download the app. The app is free but as with any other app downloads, their phone may require them to verify saved payment information or  Apple/Android password.  - The patient will need to then log into the app with their MyChart username and password, and select [facility] as their healthcare provider to link the account. When it is time for your visit, go to the MyChart app, find appointments, and click Begin Video Visit. Be sure to Select Allow for your device to access the Microphone and Camera for your visit. You will then be connected, and your provider will be with you shortly.  **If they have any issues connecting, or need assistance please contact MyChart service desk [phone])**  **If using a computer, in order to ensure the best quality for their visit they will need to use either of the following Internet Browsers: [employer], or Google Chrome**  IF USING DOXIMITY or DOXY.ME - The patient will receive a link just prior to their visit by text.     FULL LENGTH CONSENT FOR TELE-HEALTH VISIT   I hereby voluntarily request, consent and authorize [facility] and its employed or contracted physicians, physician assistants, nurse practitioners or other licensed health care professionals (the Practitioner), to provide me with telemedicine health care services (the Services") as deemed necessary by the treating Practitioner. I acknowledge and consent to receive the Services by the Practitioner via telemedicine. I understand that the telemedicine visit will involve communicating with the Practitioner through live audiovisual communication technology and the disclosure of certain medical information by electronic transmission. I acknowledge that I have been given the opportunity to request an in-person assessment or other available alternative prior to the telemedicine visit and am voluntarily participating in the telemedicine visit.  I understand that I have the right to withhold or withdraw my consent to the use of telemedicine in the course of my care at any time, without affecting my right to future care  or treatment, and that the Practitioner or I may terminate the telemedicine visit at any time. I understand that I have the right to inspect all information obtained and/or recorded in the course of the telemedicine visit and may receive copies of available information for a reasonable fee.  I understand that some of the potential risks of receiving the Services via telemedicine include:   Delay or interruption in medical evaluation due to technological equipment failure or disruption;  Information transmitted may not be sufficient (e.g. poor resolution of images) to allow for appropriate medical decision making by the Practitioner; and/or   In rare instances, security protocols could fail, causing a breach of personal health information.  Furthermore, I acknowledge that it is my responsibility to provide information about my medical history, conditions and care that is complete and accurate to the best of my ability. I acknowledge that Practitioner's advice, recommendations, and/or decision may be based on factors not within their control, such as incomplete or inaccurate data provided by me or distortions of diagnostic images or specimens that may result from electronic transmissions. I understand  that the practice of medicine is not an exact science and that Practitioner makes no warranties or guarantees regarding treatment outcomes. I acknowledge that I will receive a copy of this consent concurrently upon execution via email to the email address I last provided but may also request a printed copy by calling the office of [facility].    I understand that my insurance will be billed for this visit.   I have read or had this consent read to me.  I understand the contents of this consent, which adequately explains the benefits and risks of the Services being provided via telemedicine.   I have been provided ample opportunity to ask questions regarding this consent and the Services and have had  my questions answered to my satisfaction.  I give my informed consent for the services to be provided through the use of telemedicine in my medical care  By participating in this telemedicine visit I agree to the above.

## 2018-08-03 ENCOUNTER — Telehealth (INDEPENDENT_AMBULATORY_CARE_PROVIDER_SITE_OTHER): Payer: Medicare Other | Admitting: Cardiovascular Disease

## 2018-08-03 ENCOUNTER — Encounter: Payer: Self-pay | Admitting: Cardiovascular Disease

## 2018-08-03 ENCOUNTER — Other Ambulatory Visit: Payer: Self-pay

## 2018-08-03 VITALS — BP 140/62 | HR 64 | Ht 60.0 in | Wt 108.5 lb

## 2018-08-03 DIAGNOSIS — I5032 Chronic diastolic (congestive) heart failure: Secondary | ICD-10-CM

## 2018-08-03 DIAGNOSIS — I480 Paroxysmal atrial fibrillation: Secondary | ICD-10-CM

## 2018-08-03 NOTE — Progress Notes (Signed)
Virtual Visit via Video Note   This visit type was conducted due to national recommendations for restrictions regarding the COVID-19 Pandemic (e.g. social distancing) in an effort to limit this patient's exposure and mitigate transmission in our community.  Due to her co-morbid illnesses, this patient is at least at moderate risk for complications without adequate follow up.  This format is felt to be most appropriate for this patient at this time.  All issues noted in this document were discussed and addressed.  A limited physical exam was performed with this format.  Please refer to the patient's chart for her consent to telehealth for [facility].   Date:  08/03/2018   ID:  Krystal White, DOB 1924/08/11, MRN [id number]  Patient Location: [place] Provider Location: Office  PCP:  Krystal Pitch, MD  Cardiologist:  Krystal Sacramento, MD  Electrophysiologist:  None   Evaluation Performed:  Follow-Up Visit  Chief Complaint: Weakness  History of Present Illness:    Krystal White is a 85 y.o. female was seen via video visit for follow-up regarding chronic diastolic heart failure and paroxysmal atrial fibrillation maintained in sinus rhythm with low-dose amiodarone. He has other medical problems including hypertension, hyperlipidemia, pulmonary hypertension, osteoporosis, polymyalgia, hearing loss, aortic stenosis and  stroke.    She continues to live in a skilled nursing facility and reports that overall, she has done well.  She is very sedentary, spending most of her day either in bed or in a wheelchair.  She denies any dyspnea on exertion, chest pain, palpitations, PND, orthopnea, dizziness or syncope.  She is very hard of hearing. The patient does not have symptoms concerning for COVID-19 infection (fever, chills, cough, or new shortness of breath).    Past Medical History:  Diagnosis Date  . (HFpEF) heart failure with preserved ejection fraction ([place])    a.  01/2017 Echo: Ef 55-60%, no rwma, mild AI/MR, mildly dil LA. Mod dil RV w/ mildly reduced RV fxn. Sev dil RA. Mod TR. PASP 75mmHg; b. 09/2017 Echo: Ef 65-70%, no rwma, mod AS, mild AI, triv MR, nl LA size.  . Allergic rhinitis   . Anxiety    unspecified  . Back pain    unspecified  . Cystitis   . Edema   . Embolic stroke ([place])    a. 09/2017 Acute nonhemorrhagic infarct of R inferior temproal gyrus & right superior cerebellum, w/ acute/subacute infarct of ant L pons (likely major cortical spinal tracts); b. 09/2017 Carotid U/S: 1-39% bilat ICA stenosis.  . Gait abnormality   . Hand pain   . Hearing loss    left ear  . Hematuria   . Hypertension   . Mixed hyperlipidemia   . Moderate aortic stenosis    a. 09/2017 Echo: Mod AS, mild AI. Mean grad (S): 50mmHg. Peak grad (S): 4mmHg. Valve area (VTI): 1.11cm^2, (Vmax): 1.08cm^2, (Vmean): 1.03cm^2.  . Osteoporosis, post-menopausal   . PAF (paroxysmal atrial fibrillation) (HCC)    a. CHA2DS2VASc = 6-->Eliquis.  . Polymyalgia ([place])   . Pulmonary hypertension ([place])    a. 01/2017 Echo: PASP 41mmHg.  . Skin cancer    Past Surgical History:  Procedure Laterality Date  . BREAST BIOPSY    . INGUINAL HERNIA REPAIR Right 2005  . KYPHOPLASTY N/A 04/17/2017   Procedure: [id number];  Surgeon: Krystal Knows, MD;  Location: [facility] ORS;  Service: Orthopedics;  Laterality: N/A;     Current Meds  Medication Sig  . acetaminophen (TYLENOL) 500 MG  tablet Take 500 mg by mouth every 4 (four) hours as needed.  Marland Kitchen albuterol (PROVENTIL HFA;VENTOLIN HFA) 108 (90 Base) MCG/ACT inhaler Inhale 2 puffs into the lungs every 6 (six) hours as needed for wheezing or shortness of breath.  Marland Kitchen amiodarone (PACERONE) 100 MG tablet Take 1 tablet (100 mg total) by mouth daily.  Marland Kitchen apixaban (ELIQUIS) 2.5 MG TABS tablet Take 1 tablet (2.5 mg total) by mouth 2 (two) times daily.  Marland Kitchen azelastine (ASTELIN) 0.1 % nasal spray Place 2 sprays into both nostrils 2 (two) times daily. Use in  each nostril as directed  . budesonide-formoterol (SYMBICORT) 160-4.5 MCG/ACT inhaler Inhale 2 puffs into the lungs 2 (two) times daily.  . carboxymethylcellulose (REFRESH PLUS) 0.5 % SOLN Place 1 drop into both eyes 2 (two) times daily.  Marland Kitchen Dextromethorphan-guaiFENesin (MUCINEX DM) 30-600 MG TB12 Take by mouth as needed.  . donepezil (ARICEPT) 10 MG tablet Take 10 mg by mouth at bedtime.  . fexofenadine (ALLEGRA) 60 MG tablet Take 60 mg by mouth 2 (two) times daily.  . fluticasone (FLONASE) 50 MCG/ACT nasal spray Place 2 sprays into both nostrils daily.  . furosemide (LASIX) 40 MG tablet Take 1 tablet (40 mg total) by mouth daily.  . metoprolol tartrate (LOPRESSOR) 25 MG tablet Take 0.5 tablets (12.5 mg total) by mouth 2 (two) times daily.  . NON FORMULARY Take by mouth 2 (two) times daily. [employer]  . OXYGEN Inhale 2 L into the lungs continuous.  . potassium chloride (MICRO-K) 10 MEQ CR capsule Take 10 mEq by mouth daily.  . simvastatin (ZOCOR) 40 MG tablet Take 40 mg by mouth daily.     Allergies:   Patient has no known allergies.   Social History   Tobacco Use  . Smoking status: Former Smoker    Packs/day: 1.00    Years: 25.00    Pack years: 25.00    Types: Cigarettes    Last attempt to quit: 02/24/1978    Years since quitting: 40.4  . Smokeless tobacco: Never Used  Substance Use Topics  . Alcohol use: No  . Drug use: No     Family Hx: The patient's family history includes Aneurysm in her mother; Cancer in her sister and sister; Colon cancer in her father; Heart attack in her brother; Heart failure in her sister.  ROS:   Please see the history of present illness.     All other systems reviewed and are negative.   Prior CV studies:   The following studies were reviewed today:    Labs/Other Tests and Data Reviewed:    EKG:  No ECG reviewed.  Recent Labs: 04/01/2018: ALT 24; BUN 22; Creatinine, Ser 1.00; Hemoglobin 13.5; Magnesium 1.9; Platelets 206; Potassium 4.1;  Sodium 142; TSH 4.530   Recent Lipid Panel Lab Results  Component Value Date/Time   CHOL 198 10/16/2017 04:16 AM   TRIG 69 10/16/2017 04:16 AM   HDL 59 10/16/2017 04:16 AM   CHOLHDL 3.4 10/16/2017 04:16 AM   LDLCALC 125 (H) 10/16/2017 04:16 AM    Wt Readings from Last 3 Encounters:  08/03/18 108 lb 8 oz (49.2 kg)  04/01/18 112 lb 6 oz (51 kg)  12/21/17 110 lb (49.9 kg)     Objective:    Vital Signs:  BP 140/62   Pulse 64   Ht 5' (1.524 m)   Wt 108 lb 8 oz (49.2 kg)   BMI 21.19 kg/m    VITAL SIGNS:  reviewed GEN:  no  acute distress EYES:  sclerae anicteric, EOMI - Extraocular Movements Intact RESPIRATORY:  normal respiratory effort, symmetric expansion SKIN:  no rash, lesions or ulcers. MUSCULOSKELETAL:  no obvious deformities. NEURO:  alert and oriented x 3, no obvious focal deficit PSYCH:  normal affect   She looks frail and deconditioned.  ASSESSMENT & PLAN:    1.  Paroxysmal atrial fibrillation: Maintaining sinus rhythm on small dose amiodarone.  She is anticoagulated with low-dose Eliquis in the setting of age greater than 61 and weight of 51 kg.  No recurrent palpitations.  2.  Chronic diastolic heart failure: She appears to be euvolemic with stable weight on current dose of furosemide.  3.  Moderate aortic stenosis: Asymptomatic.    Even if this becomes severe, I do not think she is a candidate for TAVR given poor functional capacity, comorbidities and age  50.  Hyperlipidemia: Continue statin therapy.  5  Essential hypertension: Blood pressure is controlled  COVID-19 Education: The signs and symptoms of COVID-19 were discussed with the patient and how to seek care for testing (follow up with PCP or arrange E-visit).  The importance of social distancing was discussed today.  Time:   Today, I have spent 10 minutes with the patient with telehealth technology discussing the above problems.     Medication Adjustments/Labs and Tests Ordered: Current  medicines are reviewed at length with the patient today.  Concerns regarding medicines are outlined above.   Tests Ordered: No orders of the defined types were placed in this encounter.   Medication Changes: No orders of the defined types were placed in this encounter.   Disposition:  Follow up in 4 month(s)  Signed, Krystal Sacramento, MD  08/03/2018 2:32 PM    [place]

## 2018-08-03 NOTE — Patient Instructions (Signed)
Medication Instructions:  Continue same medications If you need a refill on your cardiac medications before your next appointment, please call your pharmacy.   Lab work: None If you have labs (blood work) drawn today and your tests are completely normal, you will receive your results only by: . MyChart Message (if you have MyChart) OR . A paper copy in the mail If you have any lab test that is abnormal or we need to change your treatment, we will call you to review the results.  Testing/Procedures: None  Follow-Up: At [facility], you and your health needs are our priority.  As part of our continuing mission to provide you with exceptional heart care, we have created designated Provider Care Teams.  These Care Teams include your primary Cardiologist (physician) and Advanced Practice Providers (APPs -  Physician Assistants and Nurse Practitioners) who all work together to provide you with the care you need, when you need it. You will need a follow up appointment in 4 months.  Please call our office 2 months in advance to schedule this appointment.  You may see Marquon Alcala, MD or one of the following Advanced Practice Providers on your designated Care Team:   Christopher Berge, NP Ryan Dunn, PA-C . Jacquelyn Visser, PA-C  

## 2018-09-09 ENCOUNTER — Telehealth: Payer: Self-pay | Admitting: *Deleted

## 2018-09-09 NOTE — Telephone Encounter (Signed)
I LM with Yolanda at pts facility that needing to cancel appt for pt that is scheduled for Friday 09-10-18 at 1000.  They will give message and will call back to reschedule.

## 2018-09-10 ENCOUNTER — Ambulatory Visit: Payer: Medicare Other | Admitting: Adult Health

## 2020-01-11 NOTE — Progress Notes (Signed)
Cardiology Office Note    Date:  01/12/2020   ID:  CHRISTEL BAI, DOB 05-23-1924, MRN [id number]  PCP:  Juluis Pitch, MD  Cardiologist:  Kathlyn Sacramento, MD  Electrophysiologist:  None   Chief Complaint: Follow-up  History of Present Illness:   Krystal White is a 86 y.o. female with history of HFpEF, PAF on Eliquis, embolic CVA in 06/6431, chronic hypoxic respiratory failure on supplemental oxygen via nasal cannula at 2 L, pulmonary hypertension, aortic stenosis, polymyalgia, HTN, HLD, osteoporosis, and hearing loss who presents for follow-up of her HFpEF and A. Fib.  She was initially diagnosed with A. fib in the setting of HFpEF in 01/2017.  Echo at that time showed normal LVSF.  In 09/2017, she was admitted to [facility] secondary to an acute nonhemorrhagic infarct of the right inferior temporal gyrus and right superior cerebellum as well as acute/subacute nonhemorrhagic infarct of the anterior left pons.  Carotid artery ultrasound at that time showed nonhemodynamically significant disease.  Echo showed a normal LV systolic function with moderate aortic stenosis.  Her CVA was felt to be embolic in etiology.  She was on Eliquis and in sinus rhythm during that admission.  After discussion with neurology the family opted to continue Eliquis.  She was seen in the office in 03/2018 and was overall doing well from a cardiac perspective.  She was very sedentary at baseline, spending most of her day either in bed or in a wheelchair.  There was noted to be some difficulty with swallowing at that time.  She was maintaining sinus rhythm on amiodarone.  Given prolonged QTC this was decreased to 100 mg daily.  She was most recently seen virtually in 07/2018 by her primary cardiologist and was doing well from a cardiac perspective without changes being made.  She comes in accompanied by her caregiver today.  She denies any chest pain, dyspnea, palpitations, dizziness, presyncope, syncope, falls,  hematochezia, or melena.  She remains on supplemental oxygen via nasal cannula at 2 L which has been present for at least the past 6 months per her caregiver.  She is tolerating all cardiac medications without issues.  She spends most of her time laying in bed watching TV.  Her caregiver indicates that she has done well since she has been with her, which is approximately 6 months, and is at her baseline.  She is noted to be back in A. fib of uncertain duration in the office today.   Labs independently reviewed: 03/2018 - TSH 4.530, Hgb 13.5, PLT 206, magnesium 1.9, potassium 4.1, BUN 22, serum creatinine 1.0, albumin 3.6, AST/ALT normal 09/2017 - TC 198, TG 69, HDL 59, LDL 125, A1c 5.4  Past Medical History:  Diagnosis Date  . (HFpEF) heart failure with preserved ejection fraction ([place])    a. 01/2017 Echo: Ef 55-60%, no rwma, mild AI/MR, mildly dil LA. Mod dil RV w/ mildly reduced RV fxn. Sev dil RA. Mod TR. PASP 78mmHg; b. 09/2017 Echo: Ef 65-70%, no rwma, mod AS, mild AI, triv MR, nl LA size.  . Allergic rhinitis   . Anxiety    unspecified  . Back pain    unspecified  . Cystitis   . Edema   . Embolic stroke ([place])    a. 09/2017 Acute nonhemorrhagic infarct of R inferior temproal gyrus & right superior cerebellum, w/ acute/subacute infarct of ant L pons (likely major cortical spinal tracts); b. 09/2017 Carotid U/S: 1-39% bilat ICA stenosis.  . Gait abnormality   .  Hand pain   . Hearing loss    left ear  . Hematuria   . Hypertension   . Mixed hyperlipidemia   . Moderate aortic stenosis    a. 09/2017 Echo: Mod AS, mild AI. Mean grad (S): 64mmHg. Peak grad (S): 43mmHg. Valve area (VTI): 1.11cm^2, (Vmax): 1.08cm^2, (Vmean): 1.03cm^2.  . Osteoporosis, post-menopausal   . PAF (paroxysmal atrial fibrillation) (HCC)    a. CHA2DS2VASc = 6-->Eliquis.  . Polymyalgia ([place])   . Pulmonary hypertension ([place])    a. 01/2017 Echo: PASP 28mmHg.  . Skin cancer     Past Surgical History:  Procedure  Laterality Date  . BREAST BIOPSY    . INGUINAL HERNIA REPAIR Right 2005  . KYPHOPLASTY N/A 04/17/2017   Procedure: [id number];  Surgeon: Hessie Knows, MD;  Location: [facility] ORS;  Service: Orthopedics;  Laterality: N/A;    Current Medications: Current Meds  Medication Sig  . acetaminophen (TYLENOL) 500 MG tablet Take 500 mg by mouth every 4 (four) hours as needed.  Marland Kitchen albuterol (PROVENTIL HFA;VENTOLIN HFA) 108 (90 Base) MCG/ACT inhaler Inhale 2 puffs into the lungs every 6 (six) hours as needed for wheezing or shortness of breath.  Marland Kitchen amiodarone (PACERONE) 100 MG tablet Take 1 tablet (100 mg total) by mouth daily.  Marland Kitchen apixaban (ELIQUIS) 2.5 MG TABS tablet Take 1 tablet (2.5 mg total) by mouth 2 (two) times daily.  Marland Kitchen atorvastatin (LIPITOR) 40 MG tablet Take 40 mg by mouth daily.  Marland Kitchen azelastine (ASTELIN) 0.1 % nasal spray Place 2 sprays into both nostrils 2 (two) times daily. Use in each nostril as directed  . budesonide-formoterol (SYMBICORT) 160-4.5 MCG/ACT inhaler Inhale 2 puffs into the lungs 2 (two) times daily.  . carboxymethylcellulose (REFRESH PLUS) 0.5 % SOLN Place 1 drop into both eyes 2 (two) times daily.  . ciclopirox (LOPROX) 0.77 % cream   . dexamethasone (DECADRON) 6 MG tablet   . donepezil (ARICEPT) 10 MG tablet Take 10 mg by mouth at bedtime.  . fexofenadine (ALLEGRA) 60 MG tablet Take 60 mg by mouth 2 (two) times daily.  . furosemide (LASIX) 40 MG tablet Take 1 tablet (40 mg total) by mouth daily.  Marland Kitchen ketoconazole (NIZORAL) 2 % cream   . levothyroxine (SYNTHROID) 25 MCG tablet Take 25 mcg by mouth in the morning.  . metoprolol tartrate (LOPRESSOR) 25 MG tablet Take 0.5 tablets (12.5 mg total) by mouth 2 (two) times daily.  . NON FORMULARY Take by mouth 2 (two) times daily. [employer]  . OXYGEN Inhale 2 L into the lungs continuous.  . potassium chloride (MICRO-K) 10 MEQ CR capsule Take 10 mEq by mouth daily.    Allergies:   Patient has no known allergies.   Social  History   Socioeconomic History  . Marital status: Widowed    Spouse name: Not on file  . Number of children: 1  . Years of education: 81  . Highest education level: High school graduate  Occupational History  . Not on file  Tobacco Use  . Smoking status: Former Smoker    Packs/day: 1.00    Years: 25.00    Pack years: 25.00    Types: Cigarettes    Quit date: 02/24/1978    Years since quitting: 41.9  . Smokeless tobacco: Never Used  Vaping Use  . Vaping Use: Never used  Substance and Sexual Activity  . Alcohol use: No  . Drug use: No  . Sexual activity: Never  Other Topics Concern  . Not  on file  Social History Narrative   Former smoker   No alcohol use   Widowed   1 child   DNR   Social Determinants of Radio broadcast assistant Strain:   . Difficulty of Paying Living Expenses: Not on file  Food Insecurity:   . Worried About Charity fundraiser in the Last Year: Not on file  . Ran Out of Food in the Last Year: Not on file  Transportation Needs:   . Lack of Transportation (Medical): Not on file  . Lack of Transportation (Non-Medical): Not on file  Physical Activity:   . Days of Exercise per Week: Not on file  . Minutes of Exercise per Session: Not on file  Stress:   . Feeling of Stress : Not on file  Social Connections:   . Frequency of Communication with Friends and Family: Not on file  . Frequency of Social Gatherings with Friends and Family: Not on file  . Attends Religious Services: Not on file  . Active Member of Clubs or Organizations: Not on file  . Attends Archivist Meetings: Not on file  . Marital Status: Not on file     Family History:  The patient's family history includes Aneurysm in her mother; Cancer in her sister and sister; Colon cancer in her father; Heart attack in her brother; Heart failure in her sister.  ROS:   Review of Systems  Constitutional: Positive for malaise/fatigue. Negative for chills, diaphoresis, fever and weight  loss.  HENT: Negative for congestion.   Eyes: Negative for discharge and redness.  Respiratory: Negative for cough, sputum production, shortness of breath and wheezing.   Cardiovascular: Negative for chest pain, palpitations, orthopnea, claudication, leg swelling and PND.  Gastrointestinal: Negative for abdominal pain, blood in stool, heartburn, melena, nausea and vomiting.  Musculoskeletal: Negative for falls and myalgias.  Skin: Negative for rash.  Neurological: Positive for weakness. Negative for dizziness, tingling, tremors, sensory change, speech change, focal weakness and loss of consciousness.  Endo/Heme/Allergies: Does not bruise/bleed easily.  Psychiatric/Behavioral: Negative for substance abuse. The patient is not nervous/anxious.   All other systems reviewed and are negative.    EKGs/Labs/Other Studies Reviewed:    Studies reviewed were summarized above. The additional studies were reviewed today:  2D echo 09/2017: - Left ventricle: The cavity size was normal. Wall thickness was  normal. Systolic function was vigorous. The estimated ejection  fraction was in the range of 65% to 70%. Wall motion was normal;  there were no regional wall motion abnormalities. Left  ventricular diastolic function parameters were normal.  - Aortic valve: Calcified leaflets. Moderate stenosis, mild  regurgitation. Mean gradient (S): 13 mm Hg. Peak gradient (S): 23  mm Hg. Valve area (VTI): 1.11 cm^2. Valve area (Vmax): 1.08 cm^2.  Valve area (Vmean): 1.03 cm^2.  - Mitral valve: Calcified annulus. Mildly thickened leaflets .  There was trivial regurgitation.  - Left atrium: The atrium was normal in size.  - Inferior vena cava: The vessel was normal in size. The  respirophasic diameter changes were in the normal range (>= 50%),  consistent with normal central venous pressure.   Impressions:   - Compared to a study in 01/2017, the LVEF is higher at 65-70%,  There is now  moderate aortic stenosis and mild AI with a mean  gradient of 13 mmHg. __________  2D echo 01/2017: - Left ventricle: The cavity size was normal. Wall thickness was  normal. Systolic function was normal. The  estimated ejection  fraction was in the range of 55% to 60%. Wall motion was normal;  there were no regional wall motion abnormalities.  - Aortic valve: There was mild regurgitation.  - Mitral valve: There was mild regurgitation.  - Left atrium: The atrium was mildly dilated.  - Right ventricle: The cavity size was moderately dilated. Wall  thickness was increased. Systolic function was mildly reduced.  - Right atrium: The atrium was severely dilated.  - Tricuspid valve: There was moderate regurgitation.  - Pulmonary arteries: Systolic pressure was severely increased. PA  peak pressure: 65 mm Hg (S).  - Inferior vena cava: The vessel was dilated. The respirophasic  diameter changes were blunted (< 50%), consistent with elevated  central venous pressure.  - Pericardium, extracardiac: A trivial pericardial effusion was  identified posterior to the heart.   EKG:  EKG is ordered today.  The EKG ordered today demonstrates A. fib with RVR, 108 bpm, no acute ST-T changes  Recent Labs: No results found for requested labs within last 8760 hours.  Recent Lipid Panel    Component Value Date/Time   CHOL 198 10/16/2017 0416   TRIG 69 10/16/2017 0416   HDL 59 10/16/2017 0416   CHOLHDL 3.4 10/16/2017 0416   VLDL 14 10/16/2017 0416   LDLCALC 125 (H) 10/16/2017 0416    PHYSICAL EXAM:    VS:  BP 118/74 (BP Location: Left Arm, Patient Position: Sitting, Cuff Size: Normal)   Pulse (!) 108   Ht 5' (1.524 m)   SpO2 96% Comment: 2 Liters of Oxygen  BMI 21.19 kg/m   BMI: Body mass index is 21.19 kg/m.  Physical Exam Vitals reviewed.  Constitutional:      Appearance: She is well-developed.     Comments: Elderly appearing and hard of hearing.  HENT:     Head:  Normocephalic and atraumatic.  Eyes:     General:        Right eye: No discharge.        Left eye: No discharge.  Neck:     Vascular: No JVD.  Cardiovascular:     Rate and Rhythm: Normal rate. Rhythm irregularly irregular.     Pulses: No midsystolic click and no opening snap.     Heart sounds: S1 normal and S2 normal. Heart sounds not distant. Murmur heard.  Harsh midsystolic murmur is present with a grade of 2/6 at the upper right sternal border radiating to the neck.  No friction rub.     Comments: Repeat manual heart rate of 88 bpm Pulmonary:     Effort: Pulmonary effort is normal. No respiratory distress.     Breath sounds: Normal breath sounds. No decreased breath sounds, wheezing or rales.  Chest:     Chest wall: No tenderness.  Abdominal:     General: There is no distension.     Palpations: Abdomen is soft.     Tenderness: There is no abdominal tenderness.  Musculoskeletal:     Cervical back: Normal range of motion.  Skin:    General: Skin is warm and dry.     Nails: There is no clubbing.  Neurological:     Mental Status: She is alert and oriented to person, place, and time.  Psychiatric:        Speech: Speech normal.        Behavior: Behavior normal.        Thought Content: Thought content normal.        Judgment: Judgment normal.  Wt Readings from Last 3 Encounters:  08/03/18 108 lb 8 oz (49.2 kg)  04/01/18 112 lb 6 oz (51 kg)  12/21/17 110 lb (49.9 kg)     ASSESSMENT & PLAN:   1. HFpEF: She appears euvolemic and well compensated.  Weight unable to be obtained in the office today given patient unable to stand safely on scale.  Continue furosemide 40 mg daily.  Check BMP.  CHF education.  2. PAF: She is back in A. fib today of uncertain duration/chronicity.  She is completely asymptomatic.  Given her advanced age, frail state, and asymptomatic nature we will discontinue amiodarone and plan to pursue rate control strategy with continuation of metoprolol.  Her  ventricular rate is reasonably controlled.  At this time I am not too worried about uncontrolled ventricular response with ambulation given she spends the majority of her time laying in bed watching TV.  Check TSH and BMP.  Given her CHA2DS2-VASc of 7 she remains on Eliquis which is renally dosed given age and weight.  No symptoms concerning for bleeding.  Check CBC.  3. Moderate aortic stenosis: Asymptomatic.  Even if this progressed to severe it has previously been felt she would not be a candidate for TAVR given her overall sedentary lifestyle with poor functional capacity, comorbid conditions, and advanced age.  4. HTN: Blood pressure is well controlled in the office.  Continue Lasix and metoprolol.  5. HLD: LDL 125 from 09/2017.  She remains on atorvastatin.  Disposition: F/u with Dr. Fletcher Anon or an APP in 6 months.   Medication Adjustments/Labs and Tests Ordered: Current medicines are reviewed at length with the patient today.  Concerns regarding medicines are outlined above. Medication changes, Labs and Tests ordered today are summarized above and listed in the Patient Instructions accessible in Encounters.   Signed, Christell Faith, PA-C 01/12/2020 10:22 AM     [facility] [address] [place] [address] [place], [place] [address] [phone]

## 2020-01-12 ENCOUNTER — Ambulatory Visit (INDEPENDENT_AMBULATORY_CARE_PROVIDER_SITE_OTHER): Payer: Medicare Other | Admitting: Physician Assistant

## 2020-01-12 ENCOUNTER — Other Ambulatory Visit: Payer: Self-pay

## 2020-01-12 ENCOUNTER — Encounter: Payer: Self-pay | Admitting: Physician Assistant

## 2020-01-12 VITALS — BP 118/74 | HR 108 | Ht 60.0 in

## 2020-01-12 DIAGNOSIS — I10 Essential (primary) hypertension: Secondary | ICD-10-CM

## 2020-01-12 DIAGNOSIS — E782 Mixed hyperlipidemia: Secondary | ICD-10-CM

## 2020-01-12 DIAGNOSIS — I5032 Chronic diastolic (congestive) heart failure: Secondary | ICD-10-CM | POA: Diagnosis not present

## 2020-01-12 DIAGNOSIS — I350 Nonrheumatic aortic (valve) stenosis: Secondary | ICD-10-CM | POA: Diagnosis not present

## 2020-01-12 DIAGNOSIS — I480 Paroxysmal atrial fibrillation: Secondary | ICD-10-CM | POA: Diagnosis not present

## 2020-01-12 NOTE — Patient Instructions (Signed)
Medication Instructions:  Your physician has recommended you make the following change in your medication:  1- STOP Amiodarone.  *If you need a refill on your cardiac medications before your next appointment, please call your pharmacy*  Lab Work: Your physician recommends that you return for lab work in: TODAY - CBC, BMET, TSH.  If you have labs (blood work) drawn today and your tests are completely normal, you will receive your results only by: Marland Kitchen MyChart Message (if you have MyChart) OR . A paper copy in the mail If you have any lab test that is abnormal or we need to change your treatment, we will call you to review the results.  Testing/Procedures: none  Follow-Up: At [facility], you and your health needs are our priority.  As part of our continuing mission to provide you with exceptional heart care, we have created designated Provider Care Teams.  These Care Teams include your primary Cardiologist (physician) and Advanced Practice Providers (APPs -  Physician Assistants and Nurse Practitioners) who all work together to provide you with the care you need, when you need it.  We recommend signing up for the patient portal called "MyChart".  Sign up information is provided on this After Visit Summary.  MyChart is used to connect with patients for Virtual Visits (Telemedicine).  Patients are able to view lab/test results, encounter notes, upcoming appointments, etc.  Non-urgent messages can be sent to your provider as well.   To learn more about what you can do with MyChart, go to [email or web address].    Your next appointment:   6 month(s)  The format for your next appointment:   In Person  Provider:   You may see Kathlyn Sacramento, MD or one of the following Advanced Practice Providers on your designated Care Team:    Murray Hodgkins, NP  Christell Faith, PA-C  Marrianne Mood, PA-C  Cadence [place], [place]  Laurann Montana, NP

## 2020-01-13 LAB — TSH: TSH: 2.93 u[IU]/mL (ref 0.450–4.500)

## 2020-01-13 LAB — CBC
Hematocrit: 45.2 % (ref 34.0–46.6)
Hemoglobin: 15.3 g/dL (ref 11.1–15.9)
MCH: 31.2 pg (ref 26.6–33.0)
MCHC: 33.8 g/dL (ref 31.5–35.7)
MCV: 92 fL (ref 79–97)
Platelets: 219 10*3/uL (ref 150–450)
RBC: 4.9 x10E6/uL (ref 3.77–5.28)
RDW: 13 % (ref 11.7–15.4)
WBC: 7 10*3/uL (ref 3.4–10.8)

## 2020-01-13 LAB — BASIC METABOLIC PANEL
BUN/Creatinine Ratio: 18 (ref 12–28)
BUN: 15 mg/dL (ref 10–36)
CO2: 32 mmol/L — ABNORMAL HIGH (ref 20–29)
Calcium: 9.4 mg/dL (ref 8.7–10.3)
Chloride: 95 mmol/L — ABNORMAL LOW (ref 96–106)
Creatinine, Ser: 0.85 mg/dL (ref 0.57–1.00)
GFR calc Af Amer: 68 mL/min/{1.73_m2} (ref >59)
GFR calc non Af Amer: 59 mL/min/{1.73_m2} — ABNORMAL LOW (ref >59)
Glucose: 110 mg/dL — ABNORMAL HIGH (ref 65–99)
Potassium: 4.1 mmol/L (ref 3.5–5.2)
Sodium: 144 mmol/L (ref 134–144)

## 2020-07-12 ENCOUNTER — Other Ambulatory Visit: Payer: Self-pay

## 2020-07-12 ENCOUNTER — Ambulatory Visit (INDEPENDENT_AMBULATORY_CARE_PROVIDER_SITE_OTHER): Payer: Medicare Other | Admitting: Cardiovascular Disease

## 2020-07-12 ENCOUNTER — Telehealth: Payer: Self-pay | Admitting: Cardiovascular Disease

## 2020-07-12 ENCOUNTER — Encounter: Payer: Self-pay | Admitting: Cardiovascular Disease

## 2020-07-12 VITALS — BP 128/70 | HR 135 | Ht 60.0 in

## 2020-07-12 DIAGNOSIS — I5032 Chronic diastolic (congestive) heart failure: Secondary | ICD-10-CM

## 2020-07-12 DIAGNOSIS — I350 Nonrheumatic aortic (valve) stenosis: Secondary | ICD-10-CM

## 2020-07-12 DIAGNOSIS — I482 Chronic atrial fibrillation, unspecified: Secondary | ICD-10-CM

## 2020-07-12 DIAGNOSIS — E785 Hyperlipidemia, unspecified: Secondary | ICD-10-CM

## 2020-07-12 DIAGNOSIS — I10 Essential (primary) hypertension: Secondary | ICD-10-CM | POA: Diagnosis not present

## 2020-07-12 MED ORDER — METOPROLOL TARTRATE 25 MG PO TABS
25.0000 mg | ORAL_TABLET | Freq: Two times a day (BID) | ORAL | 5 refills | Status: DC
  Filled 2020-07-12: qty 60, 30d supply, fill #0

## 2020-07-12 NOTE — Telephone Encounter (Signed)
Medication Management states the Metoprolol for patient was returned. States patient has [employer]. Please call to discuss.

## 2020-07-12 NOTE — Progress Notes (Signed)
Cardiology Office Note   Date:  07/12/2020   ID:  Krystal White, DOB June 08, 1924, MRN [id number]  PCP:  Juluis Pitch, MD  Cardiologist:   Kathlyn Sacramento, MD   Chief Complaint  Patient presents with  . Other    6 month f/u. Meds reviewed verbally with pt.      History of Present Illness: Krystal White is a 87 y.o. female who presents for follow-up visit regarding chronic diastolic heart failure on chronic atrial fibrillation. He has other medical problems including hypertension, hyperlipidemia, pulmonary hypertension, osteoporosis, polymyalgia, hearing loss, aortic stenosis and  stroke.    She continues to live in a skilled nursing facility.  She is very sedentary, spending most of her day either in bed or in a wheelchair.  She is very hard of hearing.  Difficult to obtain history due to her hearing difficulty and also I suspect some memory issues.  She denies chest pain or shortness of breath.  She denies palpitations although she is tachycardic today.  She was noted to be in atrial fibrillation during last visit and thus it was decided to discontinue amiodarone and pursue rate control strategy.  Past Medical History:  Diagnosis Date  . (HFpEF) heart failure with preserved ejection fraction (Krystal White)    a. 01/2017 Echo: Ef 55-60%, no rwma, mild AI/MR, mildly dil LA. Mod dil RV w/ mildly reduced RV fxn. Sev dil RA. Mod TR. PASP 70mmHg; b. 09/2017 Echo: Ef 65-70%, no rwma, mod AS, mild AI, triv MR, nl LA size.  . Allergic rhinitis   . Anxiety    unspecified  . Back pain    unspecified  . Cystitis   . Edema   . Embolic stroke (Krystal White)    a. 09/2017 Acute nonhemorrhagic infarct of R inferior temproal gyrus & right superior cerebellum, w/ acute/subacute infarct of ant L pons (likely major cortical spinal tracts); b. 09/2017 Carotid U/S: 1-39% bilat ICA stenosis.  . Gait abnormality   . Hand pain   . Hearing loss    left ear  . Hematuria   . Hypertension   . Mixed  hyperlipidemia   . Moderate aortic stenosis    a. 09/2017 Echo: Mod AS, mild AI. Mean grad (S): 75mmHg. Peak grad (S): 60mmHg. Valve area (VTI): 1.11cm^2, (Vmax): 1.08cm^2, (Vmean): 1.03cm^2.  . Osteoporosis, post-menopausal   . PAF (paroxysmal atrial fibrillation) (HCC)    a. CHA2DS2VASc = 6-->Eliquis.  . Polymyalgia (Krystal White)   . Pulmonary hypertension (Krystal White)    a. 01/2017 Echo: PASP 21mmHg.  . Skin cancer     Past Surgical History:  Procedure Laterality Date  . BREAST BIOPSY    . INGUINAL HERNIA REPAIR Right 2005  . KYPHOPLASTY N/A 04/17/2017   Procedure: [id number];  Surgeon: Hessie Knows, MD;  Location: [facility] ORS;  Service: Orthopedics;  Laterality: N/A;     Current Outpatient Medications  Medication Sig Dispense Refill  . acetaminophen (TYLENOL) 500 MG tablet Take 500 mg by mouth every 4 (four) hours as needed.    Marland Kitchen albuterol (PROVENTIL HFA;VENTOLIN HFA) 108 (90 Base) MCG/ACT inhaler Inhale 2 puffs into the lungs every 6 (six) hours as needed for wheezing or shortness of breath.    Marland Kitchen apixaban (ELIQUIS) 2.5 MG TABS tablet Take 1 tablet (2.5 mg total) by mouth 2 (two) times daily. 60 tablet 1  . azelastine (ASTELIN) 0.1 % nasal spray Place 2 sprays into both nostrils 2 (two) times daily. Use in each nostril as directed    .  budesonide-formoterol (SYMBICORT) 160-4.5 MCG/ACT inhaler Inhale 2 puffs into the lungs 2 (two) times daily. 1 Inhaler 12  . carboxymethylcellulose (REFRESH PLUS) 0.5 % SOLN Place 1 drop into both eyes 2 (two) times daily.    . ciclopirox (LOPROX) 0.77 % cream     . dexamethasone (DECADRON) 6 MG tablet     . donepezil (ARICEPT) 10 MG tablet Take 10 mg by mouth at bedtime.    . fexofenadine (ALLEGRA) 60 MG tablet Take 60 mg by mouth 2 (two) times daily.    . furosemide (LASIX) 40 MG tablet Take 1 tablet (40 mg total) by mouth daily. 30 tablet 0  . ketoconazole (NIZORAL) 2 % cream     . levothyroxine (SYNTHROID) 25 MCG tablet Take 25 mcg by mouth in the morning.     . metoprolol tartrate (LOPRESSOR) 25 MG tablet Take 0.5 tablets (12.5 mg total) by mouth 2 (two) times daily. 60 tablet 0  . NON FORMULARY Take by mouth 2 (two) times daily. [employer]    . OXYGEN Inhale 2 L into the lungs continuous.    . potassium chloride (MICRO-K) 10 MEQ CR capsule Take 10 mEq by mouth daily.     No current facility-administered medications for this visit.    Allergies:   Patient has no known allergies.    Social History:  The patient  reports that she quit smoking about 42 years ago. Her smoking use included cigarettes. She has a 25.00 pack-year smoking history. She has never used smokeless tobacco. She reports that she does not drink alcohol and does not use drugs.   Family History:  The patient's family history includes Aneurysm in her mother; Cancer in her sister and sister; Colon cancer in her father; Heart attack in her brother; Heart failure in her sister.    ROS:  Please see the history of present illness.   Otherwise, review of systems are positive for none.   All other systems are reviewed and negative.    PHYSICAL EXAM: VS:  BP 128/70 (BP Location: Left Arm, Patient Position: Sitting, Cuff Size: Normal)   Pulse (!) 135   Ht 5' (1.524 m)   SpO2 96%   BMI 21.19 kg/m  , BMI Body mass index is 21.19 kg/m. GEN: Frail in no acute distress.  She is sleeping in the wheelchair. HEENT: normal  Neck: no JVD, carotid bruits, or masses Cardiac: Irregularly irregular; no  rubs, or gallops,no edema .  2 out of 6 systolic murmur in the aortic area Respiratory:  clear to auscultation bilaterally, normal work of breathing GI: soft, nontender, nondistended, + BS MS: no deformity or atrophy  Skin: warm and dry, no rash Neuro:  Strength and sensation are intact Psych: Very hard of hearing   EKG:  EKG is ordered today. The ekg ordered today demonstrates atrial fibrillation with rapid ventricular response and nonspecific ST and T wave changes.   Recent  Labs: 01/12/2020: BUN 15; Creatinine, Ser 0.85; Hemoglobin 15.3; Platelets 219; Potassium 4.1; Sodium 144; TSH 2.930    Lipid Panel    Component Value Date/Time   CHOL 198 10/16/2017 0416   TRIG 69 10/16/2017 0416   HDL 59 10/16/2017 0416   CHOLHDL 3.4 10/16/2017 0416   VLDL 14 10/16/2017 0416   LDLCALC 125 (H) 10/16/2017 0416      Wt Readings from Last 3 Encounters:  08/03/18 108 lb 8 oz (49.2 kg)  04/01/18 112 lb 6 oz (51 kg)  12/21/17 110 lb (49.9  kg)        No flowsheet data found.    ASSESSMENT AND PLAN:  1.    Chronic atrial fibrillation: Ventricular rate is not controlled.  Thus, I increased metoprolol to 25 mg twice daily.  Continue anticoagulation with low-dose Eliquis.   2.  Chronic diastolic heart failure: She appears to be euvolemic with stable weight on current dose of furosemide.  3.  Moderate aortic stenosis: Asymptomatic.    Even if this becomes severe, I do not think she is a candidate for TAVR given poor functional capacity, comorbidities and age  24.  Hyperlipidemia: I agree with stopping atorvastatin given her advanced age.  5  Essential hypertension: Blood pressure is controlled    Disposition:   FU with me in 6 months  Signed,  Kathlyn Sacramento, MD  07/12/2020 9:52 AM    [place]

## 2020-07-12 NOTE — Patient Instructions (Signed)
Medication Instructions:  Your physician has recommended you make the following change in your medication:   INCREASE Metoprolol to 25 mg twice daily.  *If you need a refill on your cardiac medications before your next appointment, please call your pharmacy*   Lab Work: None ordered If you have labs (blood work) drawn today and your tests are completely normal, you will receive your results only by: Marland Kitchen MyChart Message (if you have MyChart) OR . A paper copy in the mail If you have any lab test that is abnormal or we need to change your treatment, we will call you to review the results.   Testing/Procedures: None ordered   Follow-Up: At [facility], you and your health needs are our priority.  As part of our continuing mission to provide you with exceptional heart care, we have created designated Provider Care Teams.  These Care Teams include your primary Cardiologist (physician) and Advanced Practice Providers (APPs -  Physician Assistants and Nurse Practitioners) who all work together to provide you with the care you need, when you need it.  We recommend signing up for the patient portal called "MyChart".  Sign up information is provided on this After Visit Summary.  MyChart is used to connect with patients for Virtual Visits (Telemedicine).  Patients are able to view lab/test results, encounter notes, upcoming appointments, etc.  Non-urgent messages can be sent to your provider as well.   To learn more about what you can do with MyChart, go to [email or web address].    Your next appointment:   Your physician wants you to follow-up in: 6 months You will receive a reminder letter in the mail two months in advance. If you don't receive a letter, please call our office to schedule the follow-up appointment.  The format for your next appointment:   In Person  Provider:   You may see Kathlyn Sacramento, MD or one of the following Advanced Practice Providers on your designated Care Team:     Murray Hodgkins, NP  Christell Faith, PA-C  Marrianne Mood, PA-C  Cadence Pike, [place]  Laurann Montana, NP    Other Instructions N/A

## 2020-07-12 NOTE — Telephone Encounter (Signed)
Returned the call to medication mgmt. Advised them to disregard the escribe for Metoprolol. Patient is a resident at a SNF and they will provide the patients medications. Madea verbalized understanding and sts that she will let their pharmacist know.

## 2021-05-22 ENCOUNTER — Other Ambulatory Visit: Payer: Self-pay | Admitting: Gerontology

## 2021-05-22 DIAGNOSIS — R1312 Dysphagia, oropharyngeal phase: Secondary | ICD-10-CM

## 2021-05-29 ENCOUNTER — Ambulatory Visit
Admission: RE | Admit: 2021-05-29 | Discharge: 2021-05-29 | Disposition: A | Discharge status: Home / Self Care | Payer: Medicare Other | Source: Ambulatory Visit | Attending: Gerontology | Admitting: Gerontology

## 2021-05-29 DIAGNOSIS — R1312 Dysphagia, oropharyngeal phase: Secondary | ICD-10-CM | POA: Insufficient documentation

## 2021-05-29 NOTE — Therapy (Signed)
[facility] ?[address] ?[place], [place], [address] ?Phone: [phone]   Fax:    ? ?Modified Barium Swallow ? ?Patient Details  ?Name: Krystal White ?MRN: [id number] ?Date of Birth: 1924/03/30 ?No data recorded ? ?Encounter Date: 05/29/2021 ? ? End of Session - 05/29/21 1534   ? ? Visit Number 1   ? Number of Visits 1   ? Date for SLP Re-Evaluation 05/29/21   ? SLP Start Time 1302   ? SLP Stop Time  1335   ? SLP Time Calculation (min) 33 min   ? Activity Tolerance Patient tolerated treatment well   ? ?  ?  ? ?  ? ? ? ?There were no vitals filed for this visit. ? ? Subjective Assessment - 05/29/21 1346   ? ? Subjective Extremely HOH; left sock bloody on arrival; wound on left calf bandaged by radiology staff and facility notified.   ? Currently in Pain? No/denies   ? ?  ?  ? ?  ? ? ? ?Objective Swallowing Evaluation: Type of Study: MBS-Modified Barium Swallow Study ?  ?Patient Details  ?Name: Krystal White ?MRN: [id number] ?Date of Birth: February 27, 1924 ? ?Today's Date: 05/29/2021 ?Time: SLP Start Time (ACUTE ONLY): 2841 ?-SLP Stop Time (ACUTE ONLY): 3244 ? ?SLP Time Calculation (min) (ACUTE ONLY): 33 min ? ? ?Past Medical History:  ?Past Medical History:  ?Diagnosis Date  ? (HFpEF) heart failure with preserved ejection fraction ([place])   ? a. 01/2017 Echo: Ef 55-60%, no rwma, mild AI/MR, mildly dil LA. Mod dil RV w/ mildly reduced RV fxn. Sev dil RA. Mod TR. PASP 35mHg; b. 09/2017 Echo: Ef 65-70%, no rwma, mod AS, mild AI, triv MR, nl LA size.  ? Allergic rhinitis   ? Anxiety   ? unspecified  ? Back pain   ? unspecified  ? Cystitis   ? Edema   ? Embolic stroke ([place]   ? a. 09/2017 Acute nonhemorrhagic infarct of R inferior temproal gyrus & right superior cerebellum, w/ acute/subacute infarct of ant L pons (likely major cortical spinal tracts); b. 09/2017 Carotid U/S: 1-39% bilat ICA stenosis.  ? Gait abnormality   ? Hand pain   ? Hearing loss   ? left ear  ?  Hematuria   ? Hypertension   ? Mixed hyperlipidemia   ? Moderate aortic stenosis   ? a. 09/2017 Echo: Mod AS, mild AI. Mean grad (S): 157mg. Peak grad (S): 2362m. Valve area (VTI): 1.11cm^2, (Vmax): 1.08cm^2, (Vmean): 1.03cm^2.  ? Osteoporosis, post-menopausal   ? PAF (paroxysmal atrial fibrillation) ([place] ? a. CHA2DS2VASc = 6-->Eliquis.  ? Polymyalgia ([place] ? Pulmonary hypertension ([facility] ? a. 01/2017 Echo: PASP 21m57m  ? Skin cancer   ? ?Past Surgical History:  ?Past Surgical History:  ?Procedure Laterality Date  ? BREAST BIOPSY    ? INGUINAL HERNIA REPAIR Right 2005  ? KYPHOPLASTY N/A 04/17/2017  ? Procedure: [id number]: MenzHessie Knows;  Location: [facility] ORS;  Service: Orthopedics;  Laterality: N/A;  ? ?HPI: Patient is a 89 y19r old female who resides in long term care at [facility] who is referred for MBS to assess oropharyngeal swallow function. Past medical history is noted for CVA, dementia, CHF, A-fib, anxiety, hearing loss, HTN, and HLD. Per chart review patient had acute dysphagia s/p CVA, with MBS in August 2019 showing severe oral dysphagia with secondary pharyngeal and esophageal components. Repeat MBS 01/29/2018 showed  improvement with mild oropharyngeal dysphagia due to delayed swallow initiation and transient laryngeal penetration of thin liquids. Regular diet/thin liquids was deemed appropriate. No recent hospital admissions or chest x-rays are available upon record review. ?  ?No data recorded ? ? Recommendations for follow up therapy are one component of a multi-disciplinary discharge planning process, led by the attending physician.  Recommendations may be updated based on patient status, additional functional criteria and insurance authorization. ? ?Assessment / Plan / Recommendation ? ? ?  05/29/2021  ?  1:00 PM  ?Clinical Impressions  ?Clinical Impression Patient presents with mild oropharyngeal dysphagia, consistent with function documented in December 2019 MBS report.  Oral stage is characterized by mildly reduced bolus cohesion, disorganized but grossly functional lingual transport. Swallow inititiation is delayed to the level of the pyriform sinuses, which results in transient laryngeal penetration with thin liquids, which clears with swallow. No aspiration is observed. Pharyngeal phase is characterized by adequate/functional tongue base retraction, hyolaryngeal excursion, and pharyngeal constriction, with adequate pharyngeal clearance. The pharyngoesophageal segment has adequate amplitude and duration of opening; mild intermittent retrograde flow is observed in the cervical esophagus which reduces with extended time, subsequent swallows. An esophageal sweep was performed in the upright position and noted for mild coating of barium in the cervical and thoracic esophagus, improved from prior exam. The patient was able to participate in self-feeding during the examination with minimal assistance. Recommend regular or mechanical soft solids (as tolerated at meals, per facility SLP), thin liquids, as well as reflux precautions: upright/out of bed for meals and for 30 minutes afterwards. Aspiration risk appears to be mild when following general aspiration precautions.  ?SLP Visit Diagnosis Dysphagia, oropharyngeal phase (R13.12);Dysphagia, pharyngoesophageal phase (R13.14)  ?Impact on safety and function Mild aspiration risk  ? ?   ? ?  05/29/2021  ?  1:00 PM  ?Treatment Recommendations  ?Treatment Recommendations Defer treatment plan to f/u with SLP  ?   ? ?  10/15/2017  ?  9:28 AM  ?Prognosis  ?Prognosis for Safe Diet Advancement Good  ?Barriers to Reach Goals Severity of deficits  ? ? ? ?  05/29/2021  ?  1:00 PM  ?Diet Recommendations  ?SLP Diet Recommendations Regular solids;Dysphagia 3 (Mech soft) solids;Thin liquid  ?Liquid Administration via Cup;Straw  ?Medication Administration Whole meds with puree  ?Compensations Slow rate;Small sips/bites  ?Postural Changes Remain semi-upright  after after feeds/meals (Comment);Seated upright at 90 degrees  ?   ? ? ?  05/29/2021  ?  1:00 PM  ?Other Recommendations  ?Oral Care Recommendations Oral care BID  ?Follow Up Recommendations Other (comment)  ?Assistance recommended at discharge Frequent or constant Supervision/Assistance  ? ? ? ?  10/16/2017  ? 12:11 PM  ?Frequency and Duration   ?Speech Therapy Frequency (ACUTE ONLY) min 3x week  ?   ? ? ?  05/29/2021  ?  1:00 PM  ?Oral Phase  ?Oral Phase Impaired  ?Oral - Pudding Teaspoon WFL  ?Oral - Honey Teaspoon WFL  ?Oral - Nectar Teaspoon Decreased bolus cohesion;Delayed oral transit  ?Oral - Nectar Cup Decreased bolus cohesion;Delayed oral transit  ?Oral - Thin Teaspoon Decreased bolus cohesion;Delayed oral transit  ?Oral - Thin Cup Delayed oral transit;Decreased bolus cohesion  ?Oral - Thin Straw Decreased bolus cohesion;Delayed oral transit  ?  ? ?  05/29/2021  ?  1:00 PM  ?Pharyngeal Phase  ?Pharyngeal Phase Impaired  ?Pharyngeal- Pudding Teaspoon Delayed swallow initiation-vallecula  ?Pharyngeal Material does not enter airway  ?Pharyngeal- Honey  Teaspoon Delayed swallow initiation-vallecula  ?Pharyngeal Material does not enter airway  ?Pharyngeal- Nectar Teaspoon Delayed swallow initiation-pyriform sinuses  ?Pharyngeal Material does not enter airway  ?Pharyngeal- Nectar Cup Delayed swallow initiation-pyriform sinuses  ?Pharyngeal Material does not enter airway  ?Pharyngeal- Thin Teaspoon Delayed swallow initiation-pyriform sinuses;Penetration/Aspiration during swallow  ?Pharyngeal Material enters airway, remains ABOVE vocal cords then ejected out  ?Pharyngeal- Thin Cup Delayed swallow initiation-pyriform sinuses  ?Pharyngeal Material does not enter airway;Material enters airway, remains ABOVE vocal cords then ejected out  ?Pharyngeal- Thin Straw Delayed swallow initiation-pyriform sinuses;Penetration/Aspiration during swallow  ?Pharyngeal Material enters airway, remains ABOVE vocal cords then ejected out   ?Pharyngeal- Regular Delayed swallow initiation-vallecula  ?  ? ? ?  05/29/2021  ?  1:00 PM  ?Cervical Esophageal Phase   ?Cervical Esophageal Phase --  ? ? ? ?Aliene Altes ?05/29/2021, 3:35 PM ?  ? ?   ? ?Dy

## 2021-06-27 ENCOUNTER — Emergency Department: Payer: Medicare Other

## 2021-06-27 ENCOUNTER — Encounter: Payer: Self-pay | Admitting: Internal Medicine

## 2021-06-27 ENCOUNTER — Inpatient Hospital Stay
Admission: EM | Admit: 2021-06-27 | Discharge: 2021-07-03 | DRG: 291 | Disposition: A | Discharge status: Skilled Nursing Facility | Payer: Medicare Other | Attending: Internal Medicine | Admitting: Internal Medicine

## 2021-06-27 DIAGNOSIS — I480 Paroxysmal atrial fibrillation: Secondary | ICD-10-CM | POA: Diagnosis not present

## 2021-06-27 DIAGNOSIS — M353 Polymyalgia rheumatica: Secondary | ICD-10-CM | POA: Diagnosis present

## 2021-06-27 DIAGNOSIS — I10 Essential (primary) hypertension: Secondary | ICD-10-CM

## 2021-06-27 DIAGNOSIS — Z20822 Contact with and (suspected) exposure to covid-19: Secondary | ICD-10-CM | POA: Diagnosis present

## 2021-06-27 DIAGNOSIS — E782 Mixed hyperlipidemia: Secondary | ICD-10-CM | POA: Diagnosis present

## 2021-06-27 DIAGNOSIS — Z85828 Personal history of other malignant neoplasm of skin: Secondary | ICD-10-CM

## 2021-06-27 DIAGNOSIS — J9611 Chronic respiratory failure with hypoxia: Secondary | ICD-10-CM

## 2021-06-27 DIAGNOSIS — J439 Emphysema, unspecified: Secondary | ICD-10-CM

## 2021-06-27 DIAGNOSIS — Z79899 Other long term (current) drug therapy: Secondary | ICD-10-CM

## 2021-06-27 DIAGNOSIS — Z7901 Long term (current) use of anticoagulants: Secondary | ICD-10-CM

## 2021-06-27 DIAGNOSIS — Z66 Do not resuscitate: Secondary | ICD-10-CM

## 2021-06-27 DIAGNOSIS — I5032 Chronic diastolic (congestive) heart failure: Secondary | ICD-10-CM | POA: Diagnosis not present

## 2021-06-27 DIAGNOSIS — Z8709 Personal history of other diseases of the respiratory system: Secondary | ICD-10-CM

## 2021-06-27 DIAGNOSIS — Z87891 Personal history of nicotine dependence: Secondary | ICD-10-CM

## 2021-06-27 DIAGNOSIS — I5033 Acute on chronic diastolic (congestive) heart failure: Secondary | ICD-10-CM | POA: Diagnosis present

## 2021-06-27 DIAGNOSIS — F01C0 Vascular dementia, severe, without behavioral disturbance, psychotic disturbance, mood disturbance, and anxiety: Secondary | ICD-10-CM

## 2021-06-27 DIAGNOSIS — R7989 Other specified abnormal findings of blood chemistry: Secondary | ICD-10-CM | POA: Diagnosis present

## 2021-06-27 DIAGNOSIS — Z515 Encounter for palliative care: Secondary | ICD-10-CM

## 2021-06-27 DIAGNOSIS — I272 Pulmonary hypertension, unspecified: Secondary | ICD-10-CM | POA: Diagnosis present

## 2021-06-27 DIAGNOSIS — I4891 Unspecified atrial fibrillation: Secondary | ICD-10-CM

## 2021-06-27 DIAGNOSIS — E46 Unspecified protein-calorie malnutrition: Secondary | ICD-10-CM

## 2021-06-27 DIAGNOSIS — Z8673 Personal history of transient ischemic attack (TIA), and cerebral infarction without residual deficits: Secondary | ICD-10-CM

## 2021-06-27 DIAGNOSIS — F01A0 Vascular dementia, mild, without behavioral disturbance, psychotic disturbance, mood disturbance, and anxiety: Secondary | ICD-10-CM | POA: Diagnosis present

## 2021-06-27 DIAGNOSIS — I110 Hypertensive heart disease with heart failure: Secondary | ICD-10-CM | POA: Diagnosis not present

## 2021-06-27 DIAGNOSIS — E870 Hyperosmolality and hypernatremia: Secondary | ICD-10-CM | POA: Diagnosis not present

## 2021-06-27 DIAGNOSIS — Z7951 Long term (current) use of inhaled steroids: Secondary | ICD-10-CM

## 2021-06-27 DIAGNOSIS — E039 Hypothyroidism, unspecified: Secondary | ICD-10-CM

## 2021-06-27 DIAGNOSIS — Z79890 Hormone replacement therapy (postmenopausal): Secondary | ICD-10-CM

## 2021-06-27 DIAGNOSIS — I350 Nonrheumatic aortic (valve) stenosis: Secondary | ICD-10-CM | POA: Diagnosis present

## 2021-06-27 DIAGNOSIS — R4182 Altered mental status, unspecified: Secondary | ICD-10-CM

## 2021-06-27 DIAGNOSIS — F0150 Vascular dementia without behavioral disturbance: Secondary | ICD-10-CM | POA: Diagnosis present

## 2021-06-27 DIAGNOSIS — R8281 Pyuria: Secondary | ICD-10-CM

## 2021-06-27 DIAGNOSIS — R1310 Dysphagia, unspecified: Secondary | ICD-10-CM

## 2021-06-27 DIAGNOSIS — H9190 Unspecified hearing loss, unspecified ear: Secondary | ICD-10-CM | POA: Diagnosis present

## 2021-06-27 DIAGNOSIS — Z800 Family history of malignant neoplasm of digestive organs: Secondary | ICD-10-CM

## 2021-06-27 DIAGNOSIS — I6523 Occlusion and stenosis of bilateral carotid arteries: Secondary | ICD-10-CM | POA: Diagnosis present

## 2021-06-27 DIAGNOSIS — E872 Acidosis, unspecified: Secondary | ICD-10-CM | POA: Clinically undetermined

## 2021-06-27 DIAGNOSIS — F419 Anxiety disorder, unspecified: Secondary | ICD-10-CM | POA: Diagnosis present

## 2021-06-27 DIAGNOSIS — M810 Age-related osteoporosis without current pathological fracture: Secondary | ICD-10-CM | POA: Diagnosis present

## 2021-06-27 DIAGNOSIS — T502X5A Adverse effect of carbonic-anhydrase inhibitors, benzothiadiazides and other diuretics, initial encounter: Secondary | ICD-10-CM | POA: Diagnosis not present

## 2021-06-27 DIAGNOSIS — Z8249 Family history of ischemic heart disease and other diseases of the circulatory system: Secondary | ICD-10-CM

## 2021-06-27 DIAGNOSIS — J9621 Acute and chronic respiratory failure with hypoxia: Secondary | ICD-10-CM | POA: Diagnosis present

## 2021-06-27 LAB — CBC WITH DIFFERENTIAL/PLATELET
Abs Immature Granulocytes: 0.03 10*3/uL (ref 0.00–0.07)
Basophils Absolute: 0.1 10*3/uL (ref 0.0–0.1)
Basophils Relative: 1 %
Eosinophils Absolute: 0 10*3/uL (ref 0.0–0.5)
Eosinophils Relative: 0 %
HCT: 38.1 % (ref 36.0–46.0)
Hemoglobin: 12.1 g/dL (ref 12.0–15.0)
Immature Granulocytes: 0 %
Lymphocytes Relative: 7 %
Lymphs Abs: 0.5 10*3/uL — ABNORMAL LOW (ref 0.7–4.0)
MCH: 31.2 pg (ref 26.0–34.0)
MCHC: 31.8 g/dL (ref 30.0–36.0)
MCV: 98.2 fL (ref 80.0–100.0)
Monocytes Absolute: 0.8 10*3/uL (ref 0.1–1.0)
Monocytes Relative: 11 %
Neutro Abs: 5.4 10*3/uL (ref 1.7–7.7)
Neutrophils Relative %: 81 %
Platelets: 205 10*3/uL (ref 150–400)
RBC: 3.88 MIL/uL (ref 3.87–5.11)
RDW: 13.1 % (ref 11.5–15.5)
WBC: 6.7 10*3/uL (ref 4.0–10.5)
nRBC: 0 % (ref 0.0–0.2)

## 2021-06-27 LAB — RESP PANEL BY RT-PCR (FLU A&B, COVID) ARPGX2
Influenza A by PCR: NEGATIVE
Influenza B by PCR: NEGATIVE
SARS Coronavirus 2 by RT PCR: NEGATIVE

## 2021-06-27 LAB — MAGNESIUM: Magnesium: 1.8 mg/dL (ref 1.7–2.4)

## 2021-06-27 LAB — URINALYSIS, COMPLETE (UACMP) WITH MICROSCOPIC
Bilirubin Urine: NEGATIVE
Glucose, UA: NEGATIVE mg/dL
Hgb urine dipstick: NEGATIVE
Ketones, ur: NEGATIVE mg/dL
Nitrite: NEGATIVE
Protein, ur: NEGATIVE mg/dL
Specific Gravity, Urine: 1.013 (ref 1.005–1.030)
pH: 6 (ref 5.0–8.0)

## 2021-06-27 LAB — COMPREHENSIVE METABOLIC PANEL
ALT: 12 U/L (ref 0–44)
AST: 20 U/L (ref 15–41)
Albumin: 3.2 g/dL — ABNORMAL LOW (ref 3.5–5.0)
Alkaline Phosphatase: 57 U/L (ref 38–126)
Anion gap: 9 (ref 5–15)
BUN: 14 mg/dL (ref 8–23)
CO2: 34 mmol/L — ABNORMAL HIGH (ref 22–32)
Calcium: 8.6 mg/dL — ABNORMAL LOW (ref 8.9–10.3)
Chloride: 97 mmol/L — ABNORMAL LOW (ref 98–111)
Creatinine, Ser: 0.84 mg/dL (ref 0.44–1.00)
GFR, Estimated: >60 mL/min (ref >60)
Glucose, Bld: 151 mg/dL — ABNORMAL HIGH (ref 70–99)
Potassium: 3.3 mmol/L — ABNORMAL LOW (ref 3.5–5.1)
Sodium: 140 mmol/L (ref 135–145)
Total Bilirubin: 0.5 mg/dL (ref 0.3–1.2)
Total Protein: 6.6 g/dL (ref 6.5–8.1)

## 2021-06-27 LAB — PROCALCITONIN: Procalcitonin: <0.1 ng/mL

## 2021-06-27 LAB — TROPONIN I (HIGH SENSITIVITY): Troponin I (High Sensitivity): 16 ng/L (ref <18)

## 2021-06-27 LAB — BRAIN NATRIURETIC PEPTIDE: B Natriuretic Peptide: 274.5 pg/mL — ABNORMAL HIGH (ref 0.0–100.0)

## 2021-06-27 LAB — LACTIC ACID, PLASMA
Lactic Acid, Venous: 2.5 mmol/L (ref 0.5–1.9)
Lactic Acid, Venous: 3 mmol/L (ref 0.5–1.9)

## 2021-06-27 LAB — PROTIME-INR
INR: 1.1 (ref 0.8–1.2)
Prothrombin Time: 14.1 seconds (ref 11.4–15.2)

## 2021-06-27 LAB — PHOSPHORUS: Phosphorus: 3.2 mg/dL (ref 2.5–4.6)

## 2021-06-27 LAB — APTT: aPTT: 26 seconds (ref 24–36)

## 2021-06-27 MED ORDER — DILTIAZEM HCL 25 MG/5ML IV SOLN
10.0000 mg | Freq: Once | INTRAVENOUS | Status: AC
  Administered 2021-06-27: 10 mg via INTRAVENOUS
  Filled 2021-06-27: qty 5

## 2021-06-27 MED ORDER — SODIUM CHLORIDE 0.9 % IV SOLN
2.0000 g | Freq: Once | INTRAVENOUS | Status: AC
  Administered 2021-06-27: 2 g via INTRAVENOUS
  Filled 2021-06-27: qty 12.5

## 2021-06-27 MED ORDER — ACETAMINOPHEN 325 MG PO TABS
650.0000 mg | ORAL_TABLET | Freq: Four times a day (QID) | ORAL | Status: DC | PRN
Start: 2021-06-27 — End: 2021-07-03

## 2021-06-27 MED ORDER — ADULT MULTIVITAMIN W/MINERALS CH
1.0000 | ORAL_TABLET | Freq: Every day | ORAL | Status: DC
  Administered 2021-06-28 – 2021-07-01 (×4): 1 via ORAL
  Filled 2021-06-27 (×5): qty 1

## 2021-06-27 MED ORDER — DM-GUAIFENESIN ER 30-600 MG PO TB12
1.0000 | ORAL_TABLET | Freq: Two times a day (BID) | ORAL | Status: DC
  Administered 2021-06-28 – 2021-07-01 (×6): 1 via ORAL
  Filled 2021-06-27 (×9): qty 1

## 2021-06-27 MED ORDER — SODIUM CHLORIDE 0.9 % IV BOLUS (SEPSIS)
500.0000 mL | Freq: Once | INTRAVENOUS | Status: AC
  Administered 2021-06-27: 500 mL via INTRAVENOUS

## 2021-06-27 MED ORDER — IPRATROPIUM-ALBUTEROL 0.5-2.5 (3) MG/3ML IN SOLN
3.0000 mL | Freq: Once | RESPIRATORY_TRACT | Status: AC
Start: 2021-06-27 — End: 2021-06-27
  Administered 2021-06-27: 3 mL via RESPIRATORY_TRACT
  Filled 2021-06-27: qty 3

## 2021-06-27 MED ORDER — SODIUM CHLORIDE 0.9 % IV BOLUS
500.0000 mL | Freq: Once | INTRAVENOUS | Status: AC
  Administered 2021-06-27: 500 mL via INTRAVENOUS

## 2021-06-27 MED ORDER — SODIUM CHLORIDE 0.9 % IV SOLN: INTRAVENOUS | Status: DC

## 2021-06-27 MED ORDER — METHYLPREDNISOLONE SODIUM SUCC 125 MG IJ SOLR
62.5000 mg | Freq: Once | INTRAMUSCULAR | Status: AC
  Administered 2021-06-27: 62.5 mg via INTRAVENOUS
  Filled 2021-06-27: qty 2

## 2021-06-27 MED ORDER — ONDANSETRON HCL 4 MG/2ML IJ SOLN: 4.0000 mg | Freq: Four times a day (QID) | INTRAMUSCULAR | Status: DC | PRN

## 2021-06-27 MED ORDER — VANCOMYCIN HCL IN DEXTROSE 1-5 GM/200ML-% IV SOLN
1000.0000 mg | Freq: Once | INTRAVENOUS | Status: AC
  Administered 2021-06-27: 1000 mg via INTRAVENOUS
  Filled 2021-06-27: qty 200

## 2021-06-27 MED ORDER — ONDANSETRON HCL 4 MG PO TABS: 4.0000 mg | ORAL_TABLET | Freq: Four times a day (QID) | ORAL | Status: DC | PRN

## 2021-06-27 MED ORDER — METHYLPREDNISOLONE SODIUM SUCC 40 MG IJ SOLR
40.0000 mg | Freq: Every day | INTRAMUSCULAR | Status: AC
  Administered 2021-06-28: 40 mg via INTRAVENOUS
  Filled 2021-06-27: qty 1

## 2021-06-27 MED ORDER — METOPROLOL TARTRATE 5 MG/5ML IV SOLN: 5.0000 mg | INTRAVENOUS | Status: DC | PRN

## 2021-06-27 MED ORDER — LEVOTHYROXINE SODIUM 25 MCG PO TABS
25.0000 ug | ORAL_TABLET | Freq: Every morning | ORAL | Status: DC
  Administered 2021-06-29 – 2021-07-01 (×2): 25 ug via ORAL
  Filled 2021-06-27 (×5): qty 1

## 2021-06-27 MED ORDER — VANCOMYCIN HCL IN DEXTROSE 1-5 GM/200ML-% IV SOLN
1000.0000 mg | INTRAVENOUS | Status: DC
Start: 2021-06-27 — End: 2021-06-28
  Administered 2021-06-27: 1000 mg via INTRAVENOUS
  Filled 2021-06-27: qty 200

## 2021-06-27 MED ORDER — METRONIDAZOLE 500 MG/100ML IV SOLN
500.0000 mg | Freq: Once | INTRAVENOUS | Status: AC
  Administered 2021-06-27: 500 mg via INTRAVENOUS
  Filled 2021-06-27: qty 100

## 2021-06-27 MED ORDER — APIXABAN 2.5 MG PO TABS
2.5000 mg | ORAL_TABLET | Freq: Two times a day (BID) | ORAL | Status: DC
  Administered 2021-06-28 – 2021-07-03 (×10): 2.5 mg via ORAL
  Filled 2021-06-27 (×13): qty 1

## 2021-06-27 MED ORDER — VITAMIN D 25 MCG (1000 UNIT) PO TABS
1000.0000 [IU] | ORAL_TABLET | Freq: Every day | ORAL | Status: DC
  Administered 2021-06-28 – 2021-07-03 (×5): 1000 [IU] via ORAL
  Filled 2021-06-27 (×6): qty 1

## 2021-06-27 MED ORDER — POLYETHYLENE GLYCOL 3350 17 G PO PACK: 17.0000 g | PACK | Freq: Every day | ORAL | Status: DC | PRN

## 2021-06-27 MED ORDER — MOMETASONE FURO-FORMOTEROL FUM 200-5 MCG/ACT IN AERO
2.0000 | INHALATION_SPRAY | Freq: Two times a day (BID) | RESPIRATORY_TRACT | Status: DC
Start: 2021-06-27 — End: 2021-07-03
  Administered 2021-06-28 – 2021-07-03 (×7): 2 via RESPIRATORY_TRACT
  Filled 2021-06-27 (×4): qty 8.8

## 2021-06-27 MED ORDER — IPRATROPIUM-ALBUTEROL 0.5-2.5 (3) MG/3ML IN SOLN
3.0000 mL | Freq: Four times a day (QID) | RESPIRATORY_TRACT | Status: DC
  Administered 2021-06-27 – 2021-06-28 (×5): 3 mL via RESPIRATORY_TRACT
  Filled 2021-06-27 (×5): qty 3

## 2021-06-27 MED ORDER — MIDODRINE HCL 5 MG PO TABS: 10.0000 mg | ORAL_TABLET | Freq: Once | ORAL | Status: DC | PRN

## 2021-06-27 MED ORDER — FUROSEMIDE 10 MG/ML IJ SOLN
40.0000 mg | Freq: Once | INTRAMUSCULAR | Status: AC
  Administered 2021-06-27: 40 mg via INTRAVENOUS
  Filled 2021-06-27: qty 4

## 2021-06-27 MED ORDER — METOPROLOL SUCCINATE ER 50 MG PO TB24
50.0000 mg | ORAL_TABLET | Freq: Every day | ORAL | Status: DC
  Administered 2021-06-28 – 2021-07-03 (×6): 50 mg via ORAL
  Filled 2021-06-27 (×6): qty 1

## 2021-06-27 MED ORDER — ACETAMINOPHEN 650 MG RE SUPP: 650.0000 mg | Freq: Four times a day (QID) | RECTAL | Status: DC | PRN

## 2021-06-27 MED ORDER — AZITHROMYCIN 500 MG IV SOLR
500.0000 mg | INTRAVENOUS | Status: DC
  Administered 2021-06-27: 500 mg via INTRAVENOUS
  Filled 2021-06-27: qty 5

## 2021-06-27 MED ORDER — SODIUM CHLORIDE 0.9 % IV SOLN
2.0000 g | Freq: Two times a day (BID) | INTRAVENOUS | Status: DC
  Administered 2021-06-27 – 2021-06-28 (×2): 2 g via INTRAVENOUS
  Filled 2021-06-27 (×2): qty 12.5

## 2021-06-27 NOTE — Assessment & Plan Note (Addendum)
-   Sepsis cannot be excluded at this time, given elevated lactic acid, increased respiration rate, increased heart rate with altered mental status ?- Status post cefepime, metronidazole, vancomycin, sodium chloride 1 L bolus per EDP ?- Added azithromycin ?- Procalcitonin and second lactic acid have been collected and ?are in process ?- UA is positive for trace leukocytes, patient is already on broad-spectrum antibiotic ?- Urine culture are pending ?- Blood cultures x2 and urine ?- Check MRSA PCR ?- Aspiration precaution ?- Admit to telemetry cardiac, observation ?

## 2021-06-27 NOTE — Assessment & Plan Note (Signed)
-   No suprapubic tenderness on physical exam ?- Urine culture is in process ?- Should be covered with cefepime and Vancomycin ?

## 2021-06-27 NOTE — Assessment & Plan Note (Addendum)
Appears to be fully diuresed.  Now slightly dry. ?

## 2021-06-27 NOTE — Assessment & Plan Note (Addendum)
-   Continue metoprolol succinate 50 mg daily.  Blood pressure stable. ?

## 2021-06-27 NOTE — Assessment & Plan Note (Addendum)
-   Patient takes Eliquis 2.5 mg p.o. twice daily, this has been resumed.  Heart rate is controlled with metoprolol ?

## 2021-06-27 NOTE — Assessment & Plan Note (Addendum)
-   Continue home levothyroxine 25 mcg every morning ?

## 2021-06-27 NOTE — ED Provider Notes (Signed)
? ?[facility] ?Provider Note ? ? ? Event Date/Time  ? First MD Initiated Contact with Patient 06/27/21 1509   ?  (approximate) ? ?History  ? ?Chief Complaint: Afib with IVR (Pt with a hx of afib arrived to the ED by way of EMS for evaluation of Afib with IVR. Pt was recently discharged from the hosp with pneumonia. Per ems, fever 100.8, tachy, chronic o2 on 2L [place].) ? ?HPI ? ?Krystal White is a 88 y.o. female with a past medical history of prior CVA, hypertension, hyperlipidemia, CHF, atrial fibrillation presents to the emergency department via EMS for evaluation of rapid heart rate.  Per EMS patient was recently discharged from the hospital with pneumonia.  EMS reports a fever of 100.8 patient is tachycardic around 120 bpm, currently satting in the upper 90s on 2 L which is her chronic/baseline O2 requirement.  Tachypneic in the 20s.  During my evaluation patient is confused will occasionally answer questions correctly but has slowed responses.  Awaiting family arrival additional history.  I reviewed the patient's records I do not see any recent hospital admissions or discharges locally or in care everywhere. ? ?Physical Exam  ? ?Triage Vital Signs: ?ED Triage Vitals  ?Enc Vitals Group  ?   BP 06/27/21 1453 129/72  ?   Pulse Rate 06/27/21 1453 (!) 105  ?   Resp 06/27/21 1453 (!) 24  ?   Temp 06/27/21 1453 99.3 ?F (37.4 ?C)  ?   Temp src --   ?   SpO2 06/27/21 1453 98 %  ?   Weight 06/27/21 1458 178 lb (80.7 kg)  ?   Height 06/27/21 1458 '5\' 5"'$  (1.651 m)  ?   Head Circumference --   ?   Peak Flow --   ?   Pain Score --   ?   Pain Loc --   ?   Pain Edu? --   ?   Excl. in [place]? --   ? ? ?Most recent vital signs: ?Vitals:  ? 06/27/21 1453  ?BP: 129/72  ?Pulse: (!) 105  ?Resp: (!) 24  ?Temp: 99.3 ?F (37.4 ?C)  ?SpO2: 98%  ? ? ?General: Patient is awake alert, she is moderately tachypneic, sitting up in bed.  Patient answers questions but has slowed responses and only answers occasional  questioning. ?CV:  Irregular rhythm rate around 120 bpm. ?Resp:  Mildly tachypneic patient has mild rhonchi auscultated bilaterally. ?Abd:  No distention.  Soft, nontender.  No rebound or guarding.  No reaction to abdominal palpation. ?Other:  No significant lower extremity edema. ? ? ?ED Results / Procedures / Treatments  ? ?EKG ? ?EKG viewed and interpreted by myself shows atrial fibrillation 105 bpm with a narrow QRS, normal axis, normal intervals, no concerning ST changes. ? ?RADIOLOGY ? ?I personally reviewed the CT images patient appears to have possible interstitial edema my evaluation. ?CT read as emphysema with likely inflammatory process mucous plugging but no focal pneumonia noted. ?Chest x-ray shows possible pulmonary edema. ? ? ?MEDICATIONS ORDERED IN ED: ?Medications  ?sodium chloride 0.9 % bolus 500 mL (has no administration in time range)  ? ? ? ?IMPRESSION / MDM / ASSESSMENT AND PLAN / ED COURSE  ?I reviewed the triage vital signs and the nursing notes. ? ?Patient presents to the emergency department with tachycardia she is tachypneic 100.8 fever per EMS 99.3 in the emergency department.  Currently satting in the mid upper 90s on 2 L O2 which  is her baseline O2 requirement.  We will check labs including blood cultures, start broad-spectrum antibiotics given the reported fever of 100.8 tachypnea and tachycardia as well as report of recent discharge from the hospital after pneumonia admission.  We will obtain a chest x-ray in addition the lab work.  We will IV hydrate with 500 cc of normal saline while awaiting results.  Awaiting family arrival for additional history. ? ?Patient's work-up has resulted showing elevated lactic acid of 2.5 but reassuringly normal white blood cell count including normal CBC, normal chemistry.  COVID and flu is negative.  BNP slightly elevated.  Chest x-ray equivocal for pulmonary edema versus pneumonia CT scan more consistent with mucous plugging and emphysema.  We will  dose Solu-Medrol and a DuoNeb.  Patient remains in A-fib RVR around 110 bpm we will dose a small dose of diltiazem as well.  Patient will be admitted to the hospital service for ongoing management. ? ? ?CRITICAL CARE ?Performed by: Harvest Dark ? ? ?Total critical care time: 30 minutes ? ?Critical care time was exclusive of separately billable procedures and treating other patients. ? ?Critical care was necessary to treat or prevent imminent or life-threatening deterioration. ? ?Critical care was time spent personally by me on the following activities: development of treatment plan with patient and/or surrogate as well as nursing, discussions with consultants, evaluation of patient's response to treatment, examination of patient, obtaining history from patient or surrogate, ordering and performing treatments and interventions, ordering and review of laboratory studies, ordering and review of radiographic studies, pulse oximetry and re-evaluation of patient's condition. ? ? ?FINAL CLINICAL IMPRESSION(S) / ED DIAGNOSES  ? ?Sepsis ?Pulmonary edema ?COPD exacerbation ? ?Note:  This document was prepared using Dragon voice recognition software and may include unintentional dictation errors. ?  ?Harvest Dark, MD ?06/27/21 1824 ? ?

## 2021-06-27 NOTE — Consult Note (Signed)
PHARMACY -  BRIEF ANTIBIOTIC NOTE  ? ?Pharmacy has received consult(s) for Vancomycin and Cefepime from an ED provider.  The patient's profile has been reviewed for ht/wt/allergies/indication/available labs.   ? ?One time order(s) placed for: ?Vancomycin 1g IV x1 in ED ?Cefepime 2g IV x1 in ED ?Also receiving Metronidazole '500mg'$  IV x1 in ED ? ?Further antibiotics/pharmacy consults should be ordered by admitting physician if indicated.       ?                ?Thank you, ?Shanon Brow Mavrik Bynum ?06/27/2021  3:36 PM ? ?

## 2021-06-27 NOTE — Consult Note (Signed)
Pharmacy Antibiotic Note ? ?Krystal White is a 88 y.o. female admitted on 06/27/2021 with afib with RVR. Patient recently discharge from hospital with PNA  Pharmacy has been consulted for cefepime and vancomycin dosing. ? ?Plan: ?Cefepime 2 gram Q12H ?Vancomycin 1000 mg x 1 given in ED.  ?Initiate Vancomycin 1000 mg Q24H. Goal AUC 400-550 ?Estimated AUC 491/Cmin: 13.5 ?Scr 0.84, IBW, Vd 0.72  ? ?Height: '5\' 5"'$  (165.1 cm) ?Weight: 80.7 kg (178 lb) ?IBW/kg (Calculated) : 57 ? ?Temp (24hrs), Avg:99.3 ?F (37.4 ?C), Min:99.3 ?F (37.4 ?C), Max:99.3 ?F (37.4 ?C) ? ?Recent Labs  ?Lab 06/27/21 ?1520  ?WBC 6.7  ?CREATININE 0.84  ?LATICACIDVEN 2.5*  ?  ?Estimated Creatinine Clearance: 43 mL/min (by C-G formula based on SCr of 0.84 mg/dL).   ? ?No Known Allergies ? ?Antimicrobials this admission: ?5/4 cefepime >>  ?5/4 Vancomycin >>  ?5/4 Metronidazole >> ? ?Dose adjustments this admission: ? ? ?Microbiology results: ?5/4 BCx: sent ?5/4 UCx: sent  ?5/4 MRSA PCR: orderd ? ?Thank you for allowing pharmacy to be a part of this patient?s care. ? ?Dorothe Pea, PharmD, BCPS ?Clinical Pharmacist   ?06/27/2021 6:56 PM ? ?

## 2021-06-27 NOTE — Sepsis Progress Note (Signed)
Code sepsis protocol being monitored by eLink. 

## 2021-06-27 NOTE — H&P (Addendum)
?History and Physical  ? ?Krystal White XAJ:[id number] DOB: 11/23/1924 DOA: 06/27/2021 ? ?PCP: Krystal Pitch, MD  ?Outpatient Specialists: [facility] cardiology ?Patient coming from: [facility] via EMS ? ?I have personally briefly reviewed patient's old medical records in [place]. ? ?Chief Concern: Confusion and shortness of breath ? ?HPI: 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 ? ?Initial vitals in the emergency department show temperature of 99.3, respiration rate of 24, heart rate of 105, blood pressure 129/72, SPO2 of 98% on 2 L nasal cannula ? ?Serum sodium is 140, potassium 3.3, chloride of 97, bicarb of 34, BUN of 14, serum creatinine of 0.84, GFR greater than 60, nonfasting blood glucose 151, WBC 6.7, hemoglobin 12.1, platelets of 205. ? ?BNP was elevated at 274.5. ? ?Lactic acid was elevated 2.5 on first collection at 1520.  Second lactic acid needs to be collected at the time of this dictation. ? ?Blood cultures x2 have been collected and is shown as in process. ? ?COVID/influenza A/influenza B PCR were negative. ? ?Initially on telemetry she was found to be in A-fib with RVR. ? ?ED treatment: Cefepime, metronidazole, sodium chloride 1 L bolus, vancomycin. ? ?EDP also ordered diltiazem 10 mg IV one-time dose, furosemide 40 mg IV, DuoNebs, Solu-Medrol which are pending administer per Krystal White at the time of this dictation. ?-------- ?At bedside patient is laying with bed elevated for aspiration precautions.  She is hard  of hearing and she does open her eyes spontaneously. She is able to tell me that her name is Krystal White. She is not able to tell me her age, current location. She states she does not know. ? ?I asked if she is hurting anywhere and she states no.  No family is at bedside. ? ?Social history: Patient is from nursing facility. ? ?Vaccination history: Unknown ? ?ROS: Unable to complete due to patient with moderate to advanced dementia ? ?ED Course: Discussed with emergency medicine provider, patient requiring hospitalization for chief concerns of altered mental status with possible sepsis. ? ?Assessment/Plan ? ?Principal Problem: ?  Altered mental status ?Active Problems: ?  Atrial fibrillation with RVR ([place]) ?  Chronic diastolic heart failure ([place]) ?  HTN (hypertension) ?  Vascular dementia ([place]) ?  Protein-calorie malnutrition ([place]) ?  Hypothyroidism ?  History of embolic stroke ?  DNR (do not resuscitate) ?  History of emphysema ?  Pyuria ?  Chronic respiratory failure with hypoxia (HCC) ?  ?Assessment and Plan: ? ?* Altered mental status ?- Sepsis cannot be excluded at this time, given elevated lactic acid, increased respiration rate, increased heart rate with altered mental status ?- Status post cefepime, metronidazole, vancomycin, sodium chloride 1 L bolus per EDP ?- Added azithromycin ?- Procalcitonin and second lactic acid have been collected and ?are in process ?- UA is positive for trace leukocytes, patient is already on broad-spectrum antibiotic ?- Urine culture are pending ?- Blood cultures x2 and urine ?- Check MRSA PCR ?- Aspiration precaution ?- Admit to telemetry cardiac, observation ? ?Chronic respiratory failure with hypoxia (HCC) ?- On 2 L nasal cannula baseline ? ?Pyuria ?- No suprapubic  tenderness on physical exam ?- Urine culture is in process ?- Should be covered with cefepime and Vancomycin ? ?History of emphysema ?- Status post DuoNebs one-time dose, Solu-Medrol 125 mg, injected 62.5 mg per ED ?-  Ordered Solu-Medrol 40 mg daily for 06/28/2021 ?- Resumed home DuoNebs every 6 hours ?- Resumed home Symbicort equivalent with Dulera 2 puff inhalation twice daily ? ?Hypothyroidism ?- Resumed home levothyroxine 25 mcg every morning ? ?HTN (hypertension) ?- Resumed home metoprolol succinate 50 mg daily for 06/28/2021 ? ?Chronic diastolic heart failure ([place]) ?- Strict I's and O's ?- Status post furosemide 40 mg IV per EDP ? ?Atrial fibrillation with RVR ([place]) ?- Patient takes Eliquis 2.5 mg p.o. twice daily, this has been resumed ? ?Chart reviewed.  ? ?DVT prophylaxis: Eliquis ?Code Status: DNR ?Diet: N.p.o. pending speech evaluation ?Family Communication: Krystal White at [phone], no pickup and message states that voicemail box is full and cannot accept any more messages.  Called [phone], no pickup and no voicemail box available. ?Disposition Plan: Pending clinical course ?Consults called: None at this time ?Admission status: Increased to PCU ? ?Past Medical History:  ?Diagnosis Date  ? (HFpEF) heart failure with preserved ejection fraction ([place])   ? a. 01/2017 Echo: Ef 55-60%, no rwma, mild AI/MR, mildly dil LA. Mod dil RV w/ mildly reduced RV fxn. Sev dil RA. Mod TR. PASP 63mHg; b. 09/2017 Echo: Ef 65-70%, no rwma, mod AS, mild AI, triv MR, nl LA size.  ? Allergic rhinitis   ? Anxiety   ? unspecified  ? Back pain   ? unspecified  ? Cystitis   ? Edema   ? Embolic stroke ([place]   ? a. 09/2017 Acute nonhemorrhagic infarct of R inferior temproal gyrus & right superior cerebellum, w/ acute/subacute infarct of ant L pons (likely major cortical spinal tracts); b. 09/2017 Carotid U/S: 1-39% bilat ICA stenosis.  ? Gait abnormality   ? Hand pain   ? Hearing loss   ? left ear  ? Hematuria   ? Hypertension   ? Mixed hyperlipidemia   ? Moderate aortic stenosis   ? a. 09/2017 Echo: Mod AS, mild AI. Mean grad (S): 160mg. [facility] grad (S): 2369m. Valve area (VTI): 1.11cm^2, (Vmax): 1.08cm^2, (Vmean): 1.03cm^2.  ?  Osteoporosis, post-menopausal   ? PAF (paroxysmal atrial fibrillation) (HCCSayner ? a. CHA2DS2VASc = 6-->Eliquis.  ? Polymyalgia (HCCCritt[place] ? Pulmonary hypertension (HCCStarr ? a. 01/2017 Echo: PASP 69m1m  ? Skin cancer   ? ?Past Surgical History:  ?Procedure Laterality Date  ? BREAST BIOPSY    ? INGUINAL HERNIA REPAIR Right 2005  ? KYPHOPLASTY N/A 04/17/2017  ? Procedure: [id number]: MenzHessie Knows;  Location: [facility] ORS;  Service: Orthopedics;  Laterality: N/A;  ? ?Social History:  reports that she quit smoking about 43 years ago. Her smoking use included cigarettes. She has a 25.00 pack-year smoking history. She has never used smokeless tobacco. She reports that she does not drink alcohol and does not use drugs. ? ?No Known Allergies ?Family History  ?Problem Relation Age of Onset  ? Heart failure Sister   ? Cancer Sister   ? Aneurysm Mother   ? Colon cancer Father   ? Heart attack Brother   ? Cancer Sister   ? ?Family history: Family history reviewed and not pertinent ? ?Prior to Admission medications   ?Medication Sig Start Date End Date Taking? Authorizing Provider  ?acetaminophen (TYLENOL) 500 MG tablet Take 500  mg by mouth every 4 (four) hours as needed for mild pain.   Yes [provider]  ?apixaban (ELIQUIS) 2.5 MG TABS tablet Take 1 tablet (2.5 mg total) by mouth 2 (two) times daily. 02/07/17  Yes Henreitta Leber, MD  ?azelastine (ASTELIN) 0.1 % nasal spray Place 2 sprays into both nostrils 2 (two) times daily. Use in each nostril as directed   Yes [provider]  ?budesonide-formoterol (SYMBICORT) 160-4.5 MCG/ACT inhaler Inhale 2 puffs into the lungs 2 (two) times daily. 03/06/17  Yes Salary, Avel Peace, MD  ?carboxymethylcellulose (REFRESH PLUS) 0.5 % SOLN Place 1 drop into both eyes 2 (two) times daily.   Yes [provider]  ?cholecalciferol (VITAMIN D3) 25 MCG (1000 UNIT) tablet Take 1,000 Units by mouth daily.   Yes [provider]   ?dextromethorphan-guaiFENesin (MUCINEX DM) 30-600 MG 12hr tablet Take 1 tablet by mouth every 12 (twelve) hours. 06/25/21 07/02/21 Yes [provider]  ?furosemide (LASIX) 20 MG tablet Take 20 mg by mouth daily as needed (exces

## 2021-06-27 NOTE — Consult Note (Signed)
CODE SEPSIS - PHARMACY COMMUNICATION ? ?**Broad Spectrum Antibiotics should be administered within 1 hour of Sepsis diagnosis** ? ?Time Code Sepsis Called/Page Received: 1157 ? ?Antibiotics Ordered: 1530 ? ?Time of 1st antibiotic administration: 1548 ? ?Additional action taken by pharmacy: N/A ? ?If necessary, Name of Provider/Nurse Contacted: N/A ? ?Lorna Dibble ,PharmD ?Clinical Pharmacist  ?06/27/2021  3:45 PM ? ?

## 2021-06-27 NOTE — Assessment & Plan Note (Addendum)
Back to baseline 2 L nasal cannula following diuresis. ?

## 2021-06-27 NOTE — Hospital Course (Addendum)
88 year old female with past medical history of hypothyroidism, chronic atrial fibrillation on Eliquis, chronic respiratory failure on 2 L nasal cannula, mild dementia and diastolic heart failure presented to the emergency room on 5/4 from her skilled nursing facility with confusion.  Patient found to have mildly rapid A-fib and BNP of 300.  Chest x-ray noteworthy for edema versus infection but procalcitonin level normal.  There was concerns for sepsis due to rising lactic acid levels, but sepsis was ruled out given normal procalcitonin.  Lactic acidosis felt to be secondary to multiple doses of albuterol given for breathing.  Patient started on IV Lasix and admitted to the hospitalist service.  By 5/8, likely fully diuresed, and slightly dry.  Able to be weaned to her baseline of 2 L.  Started on some gentle half-normal saline for hypernatremia. ? ?5/9: Sodium still trending up at 147, potassium 3.4 ?

## 2021-06-27 NOTE — Assessment & Plan Note (Addendum)
Stable for now on current regimen ?

## 2021-06-27 NOTE — ED Notes (Signed)
Updated pt son  ? ?

## 2021-06-28 ENCOUNTER — Other Ambulatory Visit: Payer: Self-pay

## 2021-06-28 DIAGNOSIS — I272 Pulmonary hypertension, unspecified: Secondary | ICD-10-CM | POA: Diagnosis present

## 2021-06-28 DIAGNOSIS — Z20822 Contact with and (suspected) exposure to covid-19: Secondary | ICD-10-CM | POA: Diagnosis present

## 2021-06-28 DIAGNOSIS — Z8673 Personal history of transient ischemic attack (TIA), and cerebral infarction without residual deficits: Secondary | ICD-10-CM | POA: Diagnosis not present

## 2021-06-28 DIAGNOSIS — I480 Paroxysmal atrial fibrillation: Secondary | ICD-10-CM | POA: Diagnosis present

## 2021-06-28 DIAGNOSIS — E039 Hypothyroidism, unspecified: Secondary | ICD-10-CM | POA: Diagnosis present

## 2021-06-28 DIAGNOSIS — I350 Nonrheumatic aortic (valve) stenosis: Secondary | ICD-10-CM | POA: Diagnosis present

## 2021-06-28 DIAGNOSIS — I6523 Occlusion and stenosis of bilateral carotid arteries: Secondary | ICD-10-CM | POA: Diagnosis present

## 2021-06-28 DIAGNOSIS — E872 Acidosis, unspecified: Secondary | ICD-10-CM | POA: Diagnosis present

## 2021-06-28 DIAGNOSIS — Z66 Do not resuscitate: Secondary | ICD-10-CM | POA: Diagnosis present

## 2021-06-28 DIAGNOSIS — J9621 Acute and chronic respiratory failure with hypoxia: Secondary | ICD-10-CM

## 2021-06-28 DIAGNOSIS — M353 Polymyalgia rheumatica: Secondary | ICD-10-CM | POA: Diagnosis present

## 2021-06-28 DIAGNOSIS — E46 Unspecified protein-calorie malnutrition: Secondary | ICD-10-CM | POA: Diagnosis present

## 2021-06-28 DIAGNOSIS — E782 Mixed hyperlipidemia: Secondary | ICD-10-CM | POA: Diagnosis present

## 2021-06-28 DIAGNOSIS — R7989 Other specified abnormal findings of blood chemistry: Secondary | ICD-10-CM | POA: Diagnosis present

## 2021-06-28 DIAGNOSIS — R8281 Pyuria: Secondary | ICD-10-CM | POA: Diagnosis present

## 2021-06-28 DIAGNOSIS — Z515 Encounter for palliative care: Secondary | ICD-10-CM | POA: Diagnosis not present

## 2021-06-28 DIAGNOSIS — Z79890 Hormone replacement therapy (postmenopausal): Secondary | ICD-10-CM | POA: Diagnosis not present

## 2021-06-28 DIAGNOSIS — Z7951 Long term (current) use of inhaled steroids: Secondary | ICD-10-CM | POA: Diagnosis not present

## 2021-06-28 DIAGNOSIS — I5033 Acute on chronic diastolic (congestive) heart failure: Secondary | ICD-10-CM | POA: Diagnosis present

## 2021-06-28 DIAGNOSIS — F01C0 Vascular dementia, severe, without behavioral disturbance, psychotic disturbance, mood disturbance, and anxiety: Secondary | ICD-10-CM | POA: Diagnosis not present

## 2021-06-28 DIAGNOSIS — E870 Hyperosmolality and hypernatremia: Secondary | ICD-10-CM | POA: Diagnosis not present

## 2021-06-28 DIAGNOSIS — J439 Emphysema, unspecified: Secondary | ICD-10-CM | POA: Diagnosis present

## 2021-06-28 DIAGNOSIS — H9190 Unspecified hearing loss, unspecified ear: Secondary | ICD-10-CM | POA: Diagnosis present

## 2021-06-28 DIAGNOSIS — I110 Hypertensive heart disease with heart failure: Secondary | ICD-10-CM | POA: Diagnosis present

## 2021-06-28 DIAGNOSIS — I4891 Unspecified atrial fibrillation: Secondary | ICD-10-CM | POA: Diagnosis not present

## 2021-06-28 DIAGNOSIS — F01A0 Vascular dementia, mild, without behavioral disturbance, psychotic disturbance, mood disturbance, and anxiety: Secondary | ICD-10-CM | POA: Diagnosis present

## 2021-06-28 LAB — BRAIN NATRIURETIC PEPTIDE: B Natriuretic Peptide: 302.9 pg/mL — ABNORMAL HIGH (ref 0.0–100.0)

## 2021-06-28 LAB — CBC
HCT: 35.3 % — ABNORMAL LOW (ref 36.0–46.0)
Hemoglobin: 10.9 g/dL — ABNORMAL LOW (ref 12.0–15.0)
MCH: 31 pg (ref 26.0–34.0)
MCHC: 30.9 g/dL (ref 30.0–36.0)
MCV: 100.3 fL — ABNORMAL HIGH (ref 80.0–100.0)
Platelets: 184 10*3/uL (ref 150–400)
RBC: 3.52 MIL/uL — ABNORMAL LOW (ref 3.87–5.11)
RDW: 13.1 % (ref 11.5–15.5)
WBC: 7.6 10*3/uL (ref 4.0–10.5)
nRBC: 0 % (ref 0.0–0.2)

## 2021-06-28 LAB — MRSA NEXT GEN BY PCR, NASAL: MRSA by PCR Next Gen: NOT DETECTED

## 2021-06-28 LAB — PROTIME-INR
INR: 1.1 (ref 0.8–1.2)
Prothrombin Time: 13.9 seconds (ref 11.4–15.2)

## 2021-06-28 LAB — CREATININE, SERUM
Creatinine, Ser: 0.73 mg/dL (ref 0.44–1.00)
GFR, Estimated: >60 mL/min (ref >60)

## 2021-06-28 LAB — LACTIC ACID, PLASMA: Lactic Acid, Venous: 3.7 mmol/L (ref 0.5–1.9)

## 2021-06-28 LAB — PROCALCITONIN: Procalcitonin: <0.1 ng/mL

## 2021-06-28 LAB — TSH: TSH: 0.698 u[IU]/mL (ref 0.350–4.500)

## 2021-06-28 LAB — CORTISOL-AM, BLOOD: Cortisol - AM: 9.4 ug/dL (ref 6.7–22.6)

## 2021-06-28 MED ORDER — IPRATROPIUM-ALBUTEROL 0.5-2.5 (3) MG/3ML IN SOLN
3.0000 mL | Freq: Three times a day (TID) | RESPIRATORY_TRACT | Status: DC
  Administered 2021-06-29 (×2): 3 mL via RESPIRATORY_TRACT
  Filled 2021-06-28 (×2): qty 3

## 2021-06-28 MED ORDER — METOPROLOL TARTRATE 5 MG/5ML IV SOLN
2.5000 mg | Freq: Four times a day (QID) | INTRAVENOUS | Status: DC
  Administered 2021-06-28 – 2021-07-03 (×20): 2.5 mg via INTRAVENOUS
  Filled 2021-06-28 (×21): qty 5

## 2021-06-28 MED ORDER — ORAL CARE MOUTH RINSE
15.0000 mL | Freq: Two times a day (BID) | OROMUCOSAL | Status: DC
  Administered 2021-06-28 – 2021-07-03 (×11): 15 mL via OROMUCOSAL
  Filled 2021-06-28 (×2): qty 15

## 2021-06-28 MED ORDER — FUROSEMIDE 10 MG/ML IJ SOLN
20.0000 mg | Freq: Two times a day (BID) | INTRAMUSCULAR | Status: DC
  Administered 2021-06-28 – 2021-07-01 (×7): 20 mg via INTRAVENOUS
  Filled 2021-06-28 (×7): qty 2

## 2021-06-28 MED ORDER — FUROSEMIDE 10 MG/ML IJ SOLN
40.0000 mg | Freq: Once | INTRAMUSCULAR | Status: AC
  Administered 2021-06-28: 40 mg via INTRAVENOUS
  Filled 2021-06-28: qty 4

## 2021-06-28 NOTE — Care Management Obs Status (Signed)
MEDICARE OBSERVATION STATUS NOTIFICATION ? ? ?Patient Details  ?Name: Krystal White ?MRN: [id number] ?Date of Birth: 03-Oct-1924 ? ? ?Medicare Observation Status Notification Given:  No ?Unable to reach guardian. ? ? ? ?Shelbie Hutching, RN ?06/28/2021, 4:48 PM ?

## 2021-06-28 NOTE — Progress Notes (Signed)
Attempted to contact patient's legal guardian no answer and no voicemail ?

## 2021-06-28 NOTE — Assessment & Plan Note (Addendum)
Stable for now.  No behavioral disturbance ?

## 2021-06-28 NOTE — Assessment & Plan Note (Addendum)
On dysphagia 1 diet.  BMI of 21.5.  Overall very poor prognosis ?

## 2021-06-28 NOTE — NC FL2 (Signed)
?[place] MEDICAID FL2 LEVEL OF CARE SCREENING TOOL  ?  ? ?IDENTIFICATION  ?Patient Name: ?Krystal White Birthdate: 28-Jan-1925 Sex: female Admission Date (Current Location): ?06/27/2021  ?[place] and [place] Number: ? [place] ?  Facility and Address:  ?[facility], [address], [place], [place] [address] ?     Provider Number: ?[id number]  ?Attending Physician Name and Address:  ?Annita Brod, MD ? Relative Name and Phone Number:  ?Quentin Ore [place] [phone] ?   ?Current Level of Care: ?Hospital Recommended Level of Care: ?[place] Prior Approval Number: ?  ? ?Date Approved/Denied: ?  PASRR Number: ?  ? ?Discharge Plan: ?SNF ?  ? ?Current Diagnoses: ?Patient Active Problem List  ? Diagnosis Date Noted  ? Altered mental status 06/27/2021  ? Hypothyroidism 06/27/2021  ? History of embolic stroke 26/71/2458  ? DNR (do not resuscitate) 06/27/2021  ? History of emphysema 06/27/2021  ? Pyuria 06/27/2021  ? Chronic respiratory failure with hypoxia ([place]) 06/27/2021  ? Acute ischemic stroke ([place]) 10/14/2017  ? Protein-calorie malnutrition ([place]) 04/28/2017  ? Vascular dementia ([place]) 03/26/2017  ? CHF (congestive heart failure) ([place]) 03/02/2017  ? Chronic diastolic heart failure ([place]) 02/13/2017  ? HTN (hypertension) 02/13/2017  ? Atrial fibrillation with RVR ([place]) 02/02/2017  ? ? ?Orientation RESPIRATION BLADDER Height & Weight   ?  ?Self ? Normal Incontinent Weight: 80.7 kg ?Height:  '5\' 5"'$  (165.1 cm)  ?BEHAVIORAL SYMPTOMS/MOOD NEUROLOGICAL BOWEL NUTRITION STATUS  ?    Incontinent Diet (Dysphagia 1- thin liquids)  ?AMBULATORY STATUS COMMUNICATION OF NEEDS Skin   ?Extensive Assist Verbally Normal ?  ?  ?  ?    ?     ?     ? ? ?Personal Care Assistance Level of Assistance  ?Bathing, Feeding, Dressing Bathing Assistance: Limited assistance ?Feeding assistance: Limited assistance ?Dressing Assistance: Limited assistance ?   ? ?Functional Limitations Info  ?Hearing    ?Hearing Info: Impaired ?   ? ? ?SPECIAL CARE FACTORS FREQUENCY  ?    ?  ?  ?  ?  ?  ?  ?   ? ? ?Contractures Contractures Info: Not present  ? ? ?Additional Factors Info  ?Code Status, Allergies Code Status Info: DNR ?Allergies Info: NKA ?  ?  ?  ?   ? ?Current Medications (06/28/2021):  This is the current hospital active medication list ?Current Facility-Administered Medications  ?Medication Dose Route Frequency Provider Last Rate Last Admin  ? acetaminophen (TYLENOL) tablet 650 mg  650 mg Oral Q6H PRN Cox, Amy N, DO      ? Or  ? acetaminophen (TYLENOL) suppository 650 mg  650 mg Rectal Q6H PRN Cox, Amy N, DO      ? apixaban (ELIQUIS) tablet 2.5 mg  2.5 mg Oral BID Cox, Amy N, DO   2.5 mg at 06/28/21 1221  ? cholecalciferol (VITAMIN D3) tablet 1,000 Units  1,000 Units Oral Daily Cox, Amy N, DO   1,000 Units at 06/28/21 0998  ? dextromethorphan-guaiFENesin (MUCINEX DM) 30-600 MG per 12 hr tablet 1 tablet  1 tablet Oral Q12H Cox, Amy N, DO   1 tablet at 06/28/21 1224  ? ipratropium-albuterol (DUONEB) 0.5-2.5 (3) MG/3ML nebulizer solution 3 mL  3 mL Nebulization Q6H Cox, Amy N, DO   3 mL at 06/28/21 1344  ? levothyroxine (SYNTHROID) tablet 25 mcg  25 mcg Oral q AM Cox, Amy N, DO      ? MEDLINE mouth rinse  15 mL Mouth Rinse BID Annita Brod, MD   15 mL at 06/28/21 1033  ? metoprolol succinate (TOPROL-XL) 24 hr tablet 50 mg  50 mg Oral Daily Cox, Amy N, DO   50 mg at 06/28/21 8185  ? metoprolol tartrate (LOPRESSOR) injection 5 mg  5 mg Intravenous Q2H PRN Cox, Amy N, DO      ? midodrine (PROAMATINE) tablet 10 mg  10 mg Oral Once PRN Cox, Amy N, DO      ? mometasone-formoterol (DULERA) 200-5 MCG/ACT inhaler 2 puff  2 puff Inhalation BID Cox, Amy N, DO   2 puff at 06/28/21 6314  ? multivitamin with minerals tablet 1 tablet  1 tablet Oral Daily Cox, Amy N, DO   1 tablet at 06/28/21 9702  ? ondansetron (ZOFRAN) tablet 4 mg  4 mg Oral Q6H PRN Cox, Amy N, DO      ? Or  ? ondansetron (ZOFRAN) injection 4 mg  4 mg  Intravenous Q6H PRN Cox, Amy N, DO      ? polyethylene glycol (MIRALAX / GLYCOLAX) packet 17 g  17 g Oral Daily PRN Cox, Amy N, DO      ? ?Current Outpatient Medications  ?Medication Sig Dispense Refill  ? acetaminophen (TYLENOL) 500 MG tablet Take 500 mg by mouth every 4 (four) hours as needed for mild pain.    ? apixaban (ELIQUIS) 2.5 MG TABS tablet Take 1 tablet (2.5 mg total) by mouth 2 (two) times daily. 60 tablet 1  ? azelastine (ASTELIN) 0.1 % nasal spray Place 2 sprays into both nostrils 2 (two) times daily. Use in each nostril as directed    ? budesonide-formoterol (SYMBICORT) 160-4.5 MCG/ACT inhaler Inhale 2 puffs into the lungs 2 (two) times daily. 1 Inhaler 12  ? carboxymethylcellulose (REFRESH PLUS) 0.5 % SOLN Place 1 drop into both eyes 2 (two) times daily.    ? cholecalciferol (VITAMIN D3) 25 MCG (1000 UNIT) tablet Take 1,000 Units by mouth daily.    ? dextromethorphan-guaiFENesin (MUCINEX DM) 30-600 MG 12hr tablet Take 1 tablet by mouth every 12 (twelve) hours.    ? furosemide (LASIX) 20 MG tablet Take 20 mg by mouth daily as needed (excess fluid weight).    ? furosemide (LASIX) 40 MG tablet Take 1 tablet (40 mg total) by mouth daily. 30 tablet 0  ? ipratropium-albuterol (DUONEB) 0.5-2.5 (3) MG/3ML SOLN Take 3 mLs by nebulization every 6 (six) hours.    ? levothyroxine (SYNTHROID) 25 MCG tablet Take 25 mcg by mouth in the morning.    ? metoprolol succinate (TOPROL-XL) 50 MG 24 hr tablet Take 50 mg by mouth daily.    ? Multiple Vitamins-Minerals (MULTIVITAMIN WITH MINERALS) tablet Take 1 tablet by mouth daily.    ? ? ? ?Discharge Medications: ?Please see discharge summary for a list of discharge medications. ? ?Relevant Imaging Results: ? ?Relevant Lab Results: ? ? ?Additional Information ?SS#: [id number] ? ?Shelbie Hutching, RN ? ? ? ? ?

## 2021-06-28 NOTE — ED Notes (Signed)
Report received from [facility], pt moved to 31. Pt pending hospital room availability no obvious complaints at this time. Pt opens eyes and responds to speech, smiles when spoken to.   ?

## 2021-06-28 NOTE — ED Notes (Signed)
Pt resting comfortably with eyes open, no distress noted.  ?

## 2021-06-28 NOTE — TOC Initial Note (Signed)
Transition of Care (TOC) - Initial/Assessment Note  ? ? ?Patient Details  ?Name: Krystal White ?MRN: [id number] ?Date of Birth: 08-14-1924 ? ?Transition of Care (TOC) CM/SW Contact:    ?Shelbie Hutching, RN ?Phone Number: ?06/28/2021, 4:38 PM ? ?Clinical Narrative:                 ?Patient placed under observation for altered mental status.  Patient is from [facility] in [place], she has a DSS legal guardian, Toula Moos -[phone], she has been notified, attempted to call her but her voicemail is full, she did respond to secure text and is aware that patient is in the hospital.   ?Plan is for patient to return to [facility] at discharge.  ? ?Expected Discharge Plan: [place] ?Barriers to Discharge: Continued Medical Work up ? ? ?Patient Goals and CMS Choice ?Patient states their goals for this hospitalization and ongoing recovery are:: DSS legal guardian plan to return to [facility] ?  ?  ? ?Expected Discharge Plan and Services ?Expected Discharge Plan: [place] ?  ?Discharge Planning Services: CM Consult ?Post Acute Care Choice: Resumption of Svcs/PTA Provider, [place] ?Living arrangements for the past 2 months: [place] ?                ?DME Arranged: N/A ?DME Agency: NA ?  ?  ?  ?HH Arranged: NA ?[place] Agency: NA ?  ?  ?  ? ?Prior Living Arrangements/Services ?Living arrangements for the past 2 months: [place] ?Lives with:: Facility Resident ?Patient language and need for interpreter reviewed:: Yes ?Do you feel safe going back to the place where you live?: Yes      ?Need for Family Participation in Patient Care: Yes (Comment) ?Care giver support system in place?: Yes (comment) ?  ?Criminal Activity/Legal Involvement Pertinent to Current Situation/Hospitalization: No - Comment as needed ? ?Activities of Daily Living ?Home Assistive Devices/Equipment: Other (Comment) ?ADL Screening (condition at time of admission) ?Patient's cognitive  ability adequate to safely complete daily activities?: Yes ?Is the patient deaf or have difficulty hearing?: No ?Does the patient have difficulty seeing, even when wearing glasses/contacts?: No ?Does the patient have difficulty concentrating, remembering, or making decisions?: Yes ?Patient able to express need for assistance with ADLs?: Yes ?Does the patient have difficulty dressing or bathing?: Yes ?Independently performs ADLs?: Yes (appropriate for developmental age) ?Does the patient have difficulty walking or climbing stairs?: Yes ?Weakness of Legs: Both ?Weakness of Arms/Hands: Both ? ?Permission Sought/Granted ?Permission sought to share information with : Case Manager, Family Supports, Guardian ?Permission granted to share information with : Yes, Verbal Permission Granted ? Share Information with NAME: Toula Moos ? Permission granted to share info w AGENCY: [facility] ? Permission granted to share info w Relationship: legal guardian [place] DSS ? Permission granted to share info w Contact Information: [phone] ? ?Emotional Assessment ?Appearance:: Appears stated age ?  ?  ?Orientation: : Oriented to Self ?Alcohol / Substance Use: Not Applicable ?Psych Involvement: No (comment) ? ?Admission diagnosis:  Altered mental status [R41.82] ?Patient Active Problem List  ? Diagnosis Date Noted  ? Altered mental status 06/27/2021  ? Hypothyroidism 06/27/2021  ? History of embolic stroke 96/22/2979  ? DNR (do not resuscitate) 06/27/2021  ? History of emphysema 06/27/2021  ? Pyuria 06/27/2021  ? Chronic respiratory failure with hypoxia ([place]) 06/27/2021  ? Acute ischemic stroke ([place]) 10/14/2017  ? Protein-calorie malnutrition ([place]) 04/28/2017  ? Vascular dementia ([place]) 03/26/2017  ?  CHF (congestive heart failure) ([place]) 03/02/2017  ? Chronic diastolic heart failure ([place]) 02/13/2017  ? HTN (hypertension) 02/13/2017  ? Atrial fibrillation with RVR ([place]) 02/02/2017  ? ?PCP:  Juluis Pitch, MD ?Pharmacy:   ?[facility], [place] ?[place] ?[place] [address] ?Phone: [phone] Fax: [phone] ? ? ? ? ?Social Determinants of Health (SDOH) Interventions ?  ? ?Readmission Risk Interventions ?   ? View : No data to display.  ?  ?  ?  ? ? ? ?

## 2021-06-28 NOTE — Progress Notes (Signed)
[facility] Progress Note ? ?Patient: Krystal White    SWF:[id number]  DOA: 06/27/2021    ?Date of Service: the patient was seen and examined on 06/28/2021 ? ?Brief hospital course: ?88 year old female with past medical history of hypothyroidism, chronic atrial fibrillation on Eliquis, chronic respiratory failure on 2 L nasal cannula, mild dementia and diastolic heart failure presented to the emergency room on 5/4 from her skilled nursing facility with confusion.  Patient found to have mildly rapid A-fib and BNP of 300.  Chest x-ray noteworthy for edema versus infection but procalcitonin level normal.  There was concerns for sepsis due to rising lactic acid levels, but sepsis was ruled out given normal procalcitonin.  Lactic acidosis felt to be secondary to multiple doses of albuterol given for breathing.  Patient started on IV Lasix and admitted to the hospitalist service. ? ?Assessment and Plan: ?Assessment and Plan: ?* Acute on chronic respiratory failure with hypoxia (HCC) ?Normally on 2 L.  Briefly requiring 3 L but with hydration, this has improved. ? ?Acute on chronic diastolic CHF (congestive heart failure) ([place]) ?Continue IV Lasix.  Follow electrolytes. ? ?Atrial fibrillation with RVR ([place]) ?- Patient takes Eliquis 2.5 mg p.o. twice daily, this has been resumed.  Heart rate still slightly elevated which should improve as she diuresis.  Will increase metoprolol for now. ? ?Lactic acidosis ?Secondary to albuterol.  No evidence of sepsis.  Procalcitonin level normal.  Have stopped all antibiotics. ? ?HTN (hypertension) ?- Resumed home metoprolol succinate 50 mg daily for 06/28/2021.  Blood pressure stable. ? ?Vascular dementia ([place]) ?Stable for now.  No behavioral disturbance. ? ?Protein-calorie malnutrition ([place]) ?Nutrition to see.  BMI of 21.5 ? ?History of emphysema ?- Status post DuoNebs one-time dose, Solu-Medrol 125 mg, injected 62.5 mg per ED ?- Ordered Solu-Medrol 40 mg daily for 06/28/2021 ?-  Resumed home DuoNebs every 6 hours ?- Resumed home Symbicort equivalent with Dulera 2 puff inhalation twice daily ? ?Hypothyroidism ?- Resumed home levothyroxine 25 mcg every morning ? ? ? ? ? ? ?Body mass index is 21.52 kg/m?.  ?  ?   ? ?Consultants: ?None ? ?Procedures: ?None ? ?Antimicrobials: ?IV cefepime, vancomycin and Zithromax 5/4 - 5/5 ? ?Code Status: DNR ? ? ?Subjective: Patient hard of hearing, but states that she hurts all over ? ?Objective: ?Vital signs were reviewed and unremarkable. ?Vitals:  ? 06/28/21 1841 06/28/21 1929  ?BP: 122/67 129/73  ?Pulse: 95 (!) 102  ?Resp: 19 19  ?Temp: 98.3 ?F (36.8 ?C) 98.4 ?F (36.9 ?C)  ?SpO2: 92% 94%  ? ? ?Intake/Output Summary (Last 24 hours) at 06/28/2021 1940 ?Last data filed at 06/28/2021 1218 ?Gross per 24 hour  ?Intake 1100 ml  ?Output 900 ml  ?Net 200 ml  ? ?Filed Weights  ? 06/27/21 1458 06/28/21 1841  ?Weight: 80.7 kg 58.7 kg  ? ?Body mass index is 21.52 kg/m?. ? ?Exam: ? ?General: Alert and oriented x2 ?HEENT: Normocephalic, atraumatic, mucous members are slightly dry ?Cardiovascular: Irregular rhythm, borderline tachycardia ?Respiratory: Decreased breath sounds bibasilar ?Abdomen: Soft, nontender, nondistended, positive bowel sounds ?Musculoskeletal: No clubbing or cyanosis, 1+ pitting edema ?Skin: No skin breaks, tears or lesions ?Psychiatry: Underlying dementia ?Neurology: No focal deficits ? ?Data Reviewed: ?Repeat BNP of 303 this morning.  Hemoglobin dropped to 10.9. ? ?Disposition:  ?Status is: Inpatient ?  ? ?Anticipated discharge date: 5/8 ? ?Remaining issues to be resolved so that patient can be discharged: Better diuresis ? ? ?Family Communication: Left message for family ?  DVT Prophylaxis: ?apixaban (ELIQUIS) tablet 2.5 mg Start: 06/27/21 2200 ?Place TED hose Start: 06/27/21 1839 ?apixaban (ELIQUIS) tablet 2.5 mg  ? ? ?Author: ?Annita Brod ,MD ?06/28/2021 7:40 PM ? ?To reach On-call, see care teams to locate the attending and reach out via  [email or web address]. ?Between 7PM-7AM, please contact night-coverage ?If you still have difficulty reaching the attending provider, please page the [facility] (Director on Call) for [facility] on amion for assistance. ? ?

## 2021-06-28 NOTE — ED Notes (Signed)
Visitor at bedside assisting with feeding.  ?

## 2021-06-28 NOTE — Evaluation (Signed)
Clinical/Bedside Swallow Evaluation ?Patient Details  ?Name: DEASIAH HAGBERG ?MRN: [id number] ?Date of Birth: [birth year] ? ?Today's Date: 06/28/2021 ?Time: SLP Start Time (ACUTE ONLY): [id number] SLP Stop Time (ACUTE ONLY): 3016 ?SLP Time Calculation (min) (ACUTE ONLY): 15 min ? ?Past Medical History:  ?Past Medical History:  ?Diagnosis Date  ? (HFpEF) heart failure with preserved ejection fraction ([place])   ? a. 01/2017 Echo: Ef 55-60%, no rwma, mild AI/MR, mildly dil LA. Mod dil RV w/ mildly reduced RV fxn. Sev dil RA. Mod TR. PASP 54mHg; b. 09/2017 Echo: Ef 65-70%, no rwma, mod AS, mild AI, triv MR, nl LA size.  ? Allergic rhinitis   ? Anxiety   ? unspecified  ? Back pain   ? unspecified  ? Cystitis   ? Edema   ? Embolic stroke ([place]   ? a. 09/2017 Acute nonhemorrhagic infarct of R inferior temproal gyrus & right superior cerebellum, w/ acute/subacute infarct of ant L pons (likely major cortical spinal tracts); b. 09/2017 Carotid U/S: 1-39% bilat ICA stenosis.  ? Gait abnormality   ? Hand pain   ? Hearing loss   ? left ear  ? Hematuria   ? Hypertension   ? Mixed hyperlipidemia   ? Moderate aortic stenosis   ? a. 09/2017 Echo: Mod AS, mild AI. Mean grad (S): 158mg. Peak grad (S): 2379m. Valve area (VTI): 1.11cm^2, (Vmax): 1.08cm^2, (Vmean): 1.03cm^2.  ? Osteoporosis, post-menopausal   ? PAF (paroxysmal atrial fibrillation) ([place] ? a. CHA2DS2VASc = 6-->Eliquis.  ? Polymyalgia ([place] ? Pulmonary hypertension ([place] ? a. 01/2017 Echo: PASP 20m55m  ? Skin cancer   ? ?Past Surgical History:  ?Past Surgical History:  ?Procedure Laterality Date  ? BREAST BIOPSY    ? INGUINAL HERNIA REPAIR Right 2005  ? KYPHOPLASTY N/A 04/17/2017  ? Procedure: [id number]: MenzHessie Knows;  Location: [facility] ORS;  Service: Orthopedics;  Laterality: N/A;  ? ?HPI:  ?Per Phys33 "MildJermisha White 89 y67r old female with history of hypothyroid, chronic atrial fibrillation on Eliquis, heart failure preserved ejection  fraction, chronic respiratory failure on 2 L nasal cannula baseline, hard of hearing, history of embolic stroke in [id number] cardiomegaly, biatrial enlargement, pulmonary hypertension, hypertension, hyperlipidemia, possible memory loss/changes, who presents to the emergency department via EMS from nursing facility for chief concerns of shortness of breath and confusion.    Initial vitals in the emergency department show temperature of 99.3, respiration rate of 24, heart rate of 105, blood pressure 129/72, SPO2 of 98% on 2 L nasal cannula    Serum sodium is 140, potassium 3.3, chloride of 97, bicarb of 34, BUN of 14, serum creatinine of 0.84, GFR greater than 60, nonfasting blood glucose 151, WBC 6.7, hemoglobin 12.1, platelets of 205.    BNP was elevated at 274.5.    Lactic acid was elevated 2.5 on first collection at 1520.  Second lactic acid needs to be collected at the time of this dictation.     Blood cultures x2 have been collected and is shown as in process.    COVID/influenza A/influenza B PCR were negative.     Initially on telemetry she was found to be in A-fib with RVR.    ED treatment: Cefepime, metronidazole, sodium chloride 1 L bolus, vancomycin.    EDP also ordered diltiazem 10 mg IV one-time dose, furosemide 40 mg IV, DuoNebs, Solu-Medrol which are pending administer [facility] dictation.  --------  At bedside patient is laying with bed elevated for aspiration precautions.  She is hard of hearing and she does open her eyes spontaneously. She is able to tell me that her name is Krystal White. She is not able to tell me her age, current location. She states she does not know.     I asked if she is hurting anywhere and she states no.  No family is at bedside.     Social history: Patient is from nursing facility.     Vaccination history: Unknown     ROS: Unable to complete due to patient with moderate to advanced dementia"  ?  ?Assessment / Plan / Recommendation  ?Clinical Impression ? Pt seen  for clinical swallowing evaluation. Pt alert and pleasant. HOH. Confusion noted.  ? ?Pt known to SLP services with MBSS completed 05/29/21 with recommendation for mech soft diet with thin liquids with safe swallowing strategies/aspiration precautions and reflux precautions (see note for details).  ? ?Pt on 3L/min O2 via [place] (on 2L/min O2 at baseline per chart). Baseline, congested and non-productive cough noted. Pt with inconsistent ability to participate in oral motor examination due to AMS. Noted with xerostomia (dried, cracked appearance to lingual body), dentures donned for evaluation, and generalized oral motor weakness suspected based on results of oral motor examination.  ? ?CT Chest, 06/27/21, "1. Limited by respiratory motion degradation. Emphysema. Diffuse bilateral bronchial wall thickening with mild mucous plugging suggesting central airways inflammatory process. No focal pneumonia ?is seen. 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 Mild cardiomegaly with biatrial enlargement 3. Chronic compression fractures at T3, T4, T8 and T12. Moderate ?severe compression fracture at T11, new since 2019." ? ?Pt given trials of thin liquids (via straw), puree, and solid. Pt presents with s/sx moderate oral dysphagia c/b inconsistent weak siphoning of liquids from straw. prolonged/inefficient mastication of solids with moderate oral residual which reduced with several cued sips of liquid wash, and perseverative lingual movements/"munching" following solid trials. Oral deficits likely due to reduced labial seal and lingual weakness/incoordination. Deficits likely exacerbated by AMS. Pharyngeal swallow appeared [facility] per clinical assessment. No overt or subtle s/sx pharyngeal dysphagia noted. To palpation, seemingly timely swallow initiation and seemingly adequate laryngeal elevation. No change to vocal quality or respiratory status appreciated. Despite cueing, pt unable to  feed self this date. ? ?Recommend cautious initiation of a pureed diet with thin liquids with safe swallowing strategies/aspiration precautions and reflux precautions as outlined below.  ? ?Pt is at increased risk for aspiration/aspiration PNA given AMS, cognitive deficits, multiple medical comorbidities, respiratory status, and need for assistance with feeding. ? ?SLP to f/u per POC for diet tolerance and clinical swallowing re-evaluation.  ? ?RN made aware of results, recommendations, and SLP POC. White board in pt's room updated with pertinent diet recommendations and safe swallowing strategies/aspiration precautions/ eflux precautions.  ? ?SLP Visit Diagnosis: Dysphagia, oropharyngeal phase (R13.12) ?   ?Aspiration Risk ? Mild aspiration risk  ?  ?Diet Recommendation Dysphagia 1 (Puree);Thin liquid  ? ?Liquid Administration via: Cup;Straw ?Medication Administration: Whole meds with puree ?Supervision: Staff to assist with self feeding;Full supervision/cueing for compensatory strategies ?Compensations: Minimize environmental distractions;Slow rate;Small sips/bites ?Postural Changes: Seated upright at 90 degrees (Remain upright at least 60 minutes after PO intake)  ?  ?Other  Recommendations Oral Care Recommendations: Oral care QID;Staff/trained caregiver to provide oral care   ? ?Recommendations for follow up therapy are one component of a multi-disciplinary discharge planning process, led  by the attending physician.  Recommendations may be updated based on patient status, additional functional criteria and insurance authorization. ? ?Follow up Recommendations  (follow up at [facility])  ? ? ?  ?Assistance Recommended at Discharge Frequent or constant Supervision/Assistance  ?Functional Status Assessment Patient has had a recent decline in their functional status and demonstrates the ability to make significant improvements in function in a reasonable and predictable amount of time.  ?Frequency and Duration min 2x/week   ?2 weeks ?  ?   ? ?Prognosis Prognosis for Safe Diet Advancement: Fair ?Barriers to Reach Goals: Cognitive deficits  ? ?  ? ?Swallow Study   ?General Date of Onset: 06/27/21 ?HPI: Per [id number] H&P "Renie Ora

## 2021-06-28 NOTE — Assessment & Plan Note (Addendum)
Secondary to albuterol.  No evidence of sepsis.  Procalcitonin level normal.  Have stopped all antibiotics ?

## 2021-06-28 NOTE — Progress Notes (Signed)
Admission profile updated. ?

## 2021-06-28 NOTE — ED Notes (Signed)
NO change in condition, will continue to monitor.  ?

## 2021-06-29 DIAGNOSIS — I4891 Unspecified atrial fibrillation: Secondary | ICD-10-CM | POA: Diagnosis not present

## 2021-06-29 DIAGNOSIS — J9621 Acute and chronic respiratory failure with hypoxia: Secondary | ICD-10-CM | POA: Diagnosis not present

## 2021-06-29 DIAGNOSIS — I5033 Acute on chronic diastolic (congestive) heart failure: Secondary | ICD-10-CM | POA: Diagnosis not present

## 2021-06-29 DIAGNOSIS — F01C0 Vascular dementia, severe, without behavioral disturbance, psychotic disturbance, mood disturbance, and anxiety: Secondary | ICD-10-CM | POA: Diagnosis not present

## 2021-06-29 LAB — CBC
HCT: 37.8 % (ref 36.0–46.0)
Hemoglobin: 11.9 g/dL — ABNORMAL LOW (ref 12.0–15.0)
MCH: 30.7 pg (ref 26.0–34.0)
MCHC: 31.5 g/dL (ref 30.0–36.0)
MCV: 97.7 fL (ref 80.0–100.0)
Platelets: 213 10*3/uL (ref 150–400)
RBC: 3.87 MIL/uL (ref 3.87–5.11)
RDW: 13.2 % (ref 11.5–15.5)
WBC: 11.5 10*3/uL — ABNORMAL HIGH (ref 4.0–10.5)
nRBC: 0 % (ref 0.0–0.2)

## 2021-06-29 LAB — BASIC METABOLIC PANEL
Anion gap: 10 (ref 5–15)
BUN: 22 mg/dL (ref 8–23)
CO2: 37 mmol/L — ABNORMAL HIGH (ref 22–32)
Calcium: 9 mg/dL (ref 8.9–10.3)
Chloride: 96 mmol/L — ABNORMAL LOW (ref 98–111)
Creatinine, Ser: 0.76 mg/dL (ref 0.44–1.00)
GFR, Estimated: >60 mL/min (ref >60)
Glucose, Bld: 105 mg/dL — ABNORMAL HIGH (ref 70–99)
Potassium: 2.9 mmol/L — ABNORMAL LOW (ref 3.5–5.1)
Sodium: 143 mmol/L (ref 135–145)

## 2021-06-29 LAB — URINE CULTURE

## 2021-06-29 MED ORDER — BOOST / RESOURCE BREEZE PO LIQD CUSTOM
1.0000 | Freq: Two times a day (BID) | ORAL | Status: DC
  Administered 2021-06-29 – 2021-07-01 (×4): 1 via ORAL

## 2021-06-29 MED ORDER — ENSURE ENLIVE PO LIQD: 237.0000 mL | ORAL | Status: DC

## 2021-06-29 MED ORDER — IPRATROPIUM-ALBUTEROL 0.5-2.5 (3) MG/3ML IN SOLN: 3.0000 mL | Freq: Four times a day (QID) | RESPIRATORY_TRACT | Status: DC | PRN

## 2021-06-29 MED ORDER — POTASSIUM CHLORIDE CRYS ER 20 MEQ PO TBCR
40.0000 meq | EXTENDED_RELEASE_TABLET | ORAL | Status: AC
  Administered 2021-06-29 (×2): 40 meq via ORAL
  Filled 2021-06-29 (×2): qty 2

## 2021-06-29 NOTE — Progress Notes (Signed)
Cross Cover ?New onset a fib. Confirmed on EKG. Likely related to fluid overload. Rate is below 130. Risks of injury and bleeding with anticoagulation outweighs benefit, therefore it was nit initiated. Remains on tele for monitoring.  Mental status remains at baseline pleasantly confused ?

## 2021-06-29 NOTE — Progress Notes (Signed)
[facility] Progress Note ? ?Patient: Krystal White    UKG:[id number]  DOA: 06/27/2021    ?Date of Service: the patient was seen and examined on 06/29/2021 ? ?Brief hospital course: ?88 year old female with past medical history of hypothyroidism, chronic atrial fibrillation on Eliquis, chronic respiratory failure on 2 L nasal cannula, mild dementia and diastolic heart failure presented to the emergency room on 5/4 from her skilled nursing facility with confusion.  Patient found to have mildly rapid A-fib and BNP of 300.  Chest x-ray noteworthy for edema versus infection but procalcitonin level normal.  There was concerns for sepsis due to rising lactic acid levels, but sepsis was ruled out given normal procalcitonin.  Lactic acidosis felt to be secondary to multiple doses of albuterol given for breathing.  Patient started on IV Lasix and admitted to the hospitalist service. ? ?Assessment and Plan: ?Assessment and Plan: ?* Acute on chronic respiratory failure with hypoxia (HCC) ?Patient's baseline is 2 L nasal cannula, currently on 3 although oxygen saturations have much improved and we will try to wean back down ? ?Acute on chronic diastolic CHF (congestive heart failure) ([place]) ?Continue IV Lasix.  Follow electrolytes.  Has diuresed over a liter of fluid ? ?Atrial fibrillation with RVR ([place]) ?- Patient takes Eliquis 2.5 mg p.o. twice daily, this has been resumed.  Heart rate still slightly elevated which should improve as she diuresis.  Have added scheduled IV Lopressor to her p.o. dose. ? ?Lactic acidosis ?Secondary to albuterol.  No evidence of sepsis.  Procalcitonin level normal.  Have stopped all antibiotics. ? ?HTN (hypertension) ?- Resumed home metoprolol succinate 50 mg daily for 06/28/2021.  Blood pressure stable. ? ?Vascular dementia ([place]) ?Stable for now.  No behavioral disturbance. ? ?Dysphagia ?On dysphagia 1 diet.  Nutrition to see.  BMI of 21.5 ? ?History of emphysema ?- Status post DuoNebs  one-time dose, Solu-Medrol 125 mg, injected 62.5 mg per ED ?- Ordered Solu-Medrol 40 mg daily for 06/28/2021 ?- Resumed home DuoNebs every 6 hours ?- Resumed home Symbicort equivalent with Dulera 2 puff inhalation twice daily ? ?Hypothyroidism ?- Resumed home levothyroxine 25 mcg every morning ? ? ? ? ? ? ?Body mass index is 21.52 kg/m?Marland Kitchen  ?Nutrition Problem: Inadequate oral intake ?Etiology: acute illness, lethargy/confusion ?   ? ?Consultants: ?None ? ?Procedures: ?None ? ?Antimicrobials: ?IV cefepime, vancomycin and Zithromax 5/4 - 5/5 ? ?Code Status: DNR ? ? ?Subjective: Patient hard of hearing, but states breathing is better ? ?Objective: ?Vital signs were reviewed and unremarkable. ?Vitals:  ? 06/29/21 1300 06/29/21 1552  ?BP: 121/64 (!) 129/58  ?Pulse: (!) 103 91  ?Resp: 18 16  ?Temp:  97.9 ?F (36.6 ?C)  ?SpO2:  98%  ? ? ?Intake/Output Summary (Last 24 hours) at 06/29/2021 1636 ?Last data filed at 06/29/2021 1411 ?Gross per 24 hour  ?Intake 179 ml  ?Output --  ?Net 179 ml  ? ?Filed Weights  ? 06/27/21 1458 06/28/21 1841  ?Weight: 80.7 kg 58.7 kg  ? ?Body mass index is 21.52 kg/m?. ? ?Exam: ? ?General: Alert and oriented x2 ?HEENT: Normocephalic, atraumatic, mucous members are slightly dry ?Cardiovascular: Irregular rhythm, borderline tachycardia ?Respiratory: Decreased breath sounds bibasilar ?Abdomen: Soft, nontender, nondistended, positive bowel sounds ?Musculoskeletal: No clubbing or cyanosis, 1+ pitting edema ?Skin: No skin breaks, tears or lesions ?Psychiatry: Underlying dementia ?Neurology: No focal deficits ? ?Data Reviewed: ?Potassium down to 2.9.  Renal function stable.  Bicarb of 37.  White count of 11.5, but hemoglobin improved. ? ?  Disposition:  ?Status is: Inpatient ?  ? ?Anticipated discharge date: 5/8 ? ?Remaining issues to be resolved so that patient can be discharged: Better diuresis ? ? ?Family Communication: Left message for family ?DVT Prophylaxis: ?apixaban (ELIQUIS) tablet 2.5 mg Start: 06/27/21  2200 ?Place TED hose Start: 06/27/21 1839 ?apixaban (ELIQUIS) tablet 2.5 mg  ? ? ?Author: ?Annita Brod ,MD ?06/29/2021 4:36 PM ? ?To reach On-call, see care teams to locate the attending and reach out via [email or web address]. ?Between 7PM-7AM, please contact night-coverage ?If you still have difficulty reaching the attending provider, please page the [facility] (Director on Call) for [facility] on amion for assistance. ? ?

## 2021-06-29 NOTE — Progress Notes (Signed)
Initial Nutrition Assessment ?RD working remotely. ? ?DOCUMENTATION CODES:  ? ?Not applicable ? ?INTERVENTION:  ?- will order Boost Breeze BID, each supplement provides 250 kcal and 9 grams of protein. ? ?- will order Ensure Plus High Protein once/day, each supplement provides 350 kcal and 20 grams of protein. ? ?- complete NFPE when feasible.  ? ? ?NUTRITION DIAGNOSIS:  ? ?Inadequate oral intake related to acute illness, lethargy/confusion as evidenced by meal completion < 25%. ? ?GOAL:  ? ?Patient will meet greater than or equal to 90% of their needs ? ?MONITOR:  ? ?PO intake, Supplement acceptance, Labs, Weight trends ? ?REASON FOR ASSESSMENT:  ? ?Consult ?Assessment of nutrition requirement/status ? ?ASSESSMENT:  ? ?88 year old female with medical history of hypothyroidism, afib on Eliquis, CHF, chronic respiratory failure on 2L at baseline, hard of hearing, history of embolic stroke in 8841, mild cardiomegaly, biatrial enlargement, pulmonary hypertension, HTN, HLD, and possible memory loss/changes. She presented to the ED via EMS from nursing facility due to shortness of breath and confusion. In the ED she was noted to be in afib with RVR. ? ?Patient is noted to be hard of hearing and a/o to person and place only.  ? ?Diet advanced from NPO to Dysphagia 1, thin liquids yesterday AM and the only documented meal completion was 0% of lunch today.  ? ?SLP last saw patient this AM and recommended to continue current diet order. ? ?She has not been seen by a [place] RD at any time in the past.  ? ?Weight yesterday was 129 lb which is up from 108 lb on 08/03/2018. No information documented in the edema section of flow sheet.  ? ? ?Labs reviewed; K: 2.9 mmol/l, Cl: 96 mmol/l. ? ?Medications reviewed; 1000 units cholecalciferol/day, 20 mg IV lasix BID, 25 mcg oral synthroid/day, 1 tablet multivitamin with minerals/day, 40 mEq Klor-Con x2 doses 5/6.   ?  ? ?NUTRITION - FOCUSED PHYSICAL EXAM: ? ?RD working remotely.   ? ?Diet Order:   ?Diet Order   ? ?       ?  DIET - DYS 1 Room service appropriate? No; Fluid consistency: Thin  Diet effective now       ?  ? ?  ?  ? ?  ? ? ?EDUCATION NEEDS:  ? ?No education needs have been identified at this time ? ?Skin:  Skin Assessment: Reviewed RN Assessment ? ?Last BM:  5/6 (type 6 x2, both medium amount; type 7 x1, medium amount) ? ?Height:  ? ?Ht Readings from Last 1 Encounters:  ?06/27/21 '5\' 5"'$  (1.651 m)  ? ? ?Weight:  ? ?Wt Readings from Last 1 Encounters:  ?06/28/21 58.7 kg  ? ? ? ?BMI:  Body mass index is 21.52 kg/m?. ? ?Estimated Nutritional Needs:  ?Kcal:  1300-1550 kcal ?Protein:  65-75 grams ?Fluid:  >/= 1.4 L/day ? ? ? ? ? ?Jarome Matin, MS, RD, LDN ?Registered Dietitian II ?Inpatient Clinical Nutrition ?RD pager # and on-call/weekend pager # available in [place]  ? ?

## 2021-06-29 NOTE — Progress Notes (Signed)
Speech Language Pathology Treatment: Dysphagia  ?Patient Details ?Name: Krystal White ?MRN: [id number] ?DOB: 01/22/1925 ?Today's Date: 06/29/2021 ?Time: 9983-3825 ?SLP Time Calculation (min) (ACUTE ONLY): 40 min ? ?Assessment / Plan / Recommendation ?Clinical Impression ? Pt seen for ongoing assessment of swallowing and toleration of oral diet. Noted chart notes indicating pt has experienced fluid overload; no IVFs running currently. NSG reported MD is monitoring pt's Respiratory status. Pt is HOH Baseline; she has Advanced Dementia baseline. She required MOD+ verbal/visual cues for follow through w/ po tasks. She often declined most po's despite encouragement. ANY Cognitive decline can impact her overall awareness/timing of swallow and safety during po tasks which increases risk for aspiration, choking. ?Noted Mild increased in Respiratory effort w/ min use of accessory muscles, mouth breathing. [place] O2 at 3L.  ? ?Pt supported w/ more support for upright, midline sitting. Rest Break given to calm breathing. PO trials of thin liquids VIA CUP and purees given (w/ encouragement) -- pt gravitated to the drinks (water, juice) vs the food trials offered (pudding, applesauce). She appeared to present w/ grossly adequate oropharyngeal phase swallow function w/ No immediate, overt clinical s/s of aspiration noted; no wet vocal quality b/t trials, no cough, and laryngeal excursion appeared wfl. Pt's respiratory effort did appear to increase minimally during back to back po trials but calmed again w/ Rest Breaks b/t trials. Swallowing of po's was timely. Pt was encouraged to drink slowly w/ Single sips -- NO straw utilized. Pt held cup independently. Oral phase adequate for bolus management of the trials; timely A-P transfer and full oral clearing achieved post swallows.  ? ?Pt's overall oral intake/swallowing appears impacted by her Pulmonary decline as well as Cognitive decline (Advanced Dementia); she is at increased risk  for aspiration in setting of declined Cognitive, medical. and respiratory status'. ANY significant Pulmonary decline can impact Apnea timing during the swallow which can increase risk of airway protection, and increase risk for aspiration to occur.  ? ?Education given to pt and NSG on general aspiration precautions d/t Pulmonary status - the need for frequent Rest Breaks during oral intake; small sips and bites; Pills Crushed in puree.  ?Recommend continue a Dysphagia level 1 (PUREE) foods for conservation of energy and easier oral intake to lessen WOB during oral phase effort. Recommend thin liquids via Cup; general aspiration precautions. Recommend f/u w/ support services including Palliative Care for [place]; Dietician. ST services will continue to monitor status for appropriateness to upgrade diet consistency while admitted. NSG/MD updated.   ? ? ?  ?HPI HPI: Per 56 H&P "Krystal White is a 88 year old female with history of Advanced Dementia, hypothyroid, chronic atrial fibrillation on Eliquis, heart failure preserved ejection fraction, chronic respiratory failure on 2 L nasal cannula baseline, hard of hearing, history of embolic stroke in 0539, mild cardiomegaly, biatrial enlargement, pulmonary hypertension, hypertension, hyperlipidemia, possible memory loss/changes, who presents to the emergency department via EMS from nursing facility for chief concerns of shortness of breath and confusion.    Initial vitals in the emergency department show temperature of 99.3, respiration rate of 24, heart rate of 105, blood pressure 129/72, SPO2 of 98% on 2 L nasal cannula    Serum sodium is 140, potassium 3.3, chloride of 97, bicarb of 34, BUN of 14, serum creatinine of 0.84, GFR greater than 60, nonfasting blood glucose 151, WBC 6.7, hemoglobin 12.1, platelets of 205.    BNP was elevated at 274.5.    Lactic acid was elevated 2.5 on first  collection at 1520.  Second lactic acid needs to be collected at the time of  this dictation.     Blood cultures x2 have been collected and is shown as in process.    COVID/influenza A/influenza B PCR were negative.     Initially on telemetry she was found to be in A-fib with RVR.    ED treatment: Cefepime, metronidazole, sodium chloride 1 L bolus, vancomycin.    EDP also ordered diltiazem 10 mg IV one-time dose, furosemide 40 mg IV, DuoNebs, Solu-Medrol which are pending administer per [facility] at the time of this dictation. Social history: Patient is from nursing facility.     Vaccination history: Unknown     ROS: Unable to complete due to patient with moderate to advanced Dementia". ?  ?   ?SLP Plan ? Continue with current plan of care (monitor overall status) ? ?  ?  ?Recommendations for follow up therapy are one component of a multi-disciplinary discharge planning process, led by the attending physician.  Recommendations may be updated based on patient status, additional functional criteria and insurance authorization. ?  ? ?Recommendations  ?Diet recommendations: Dysphagia 1 (puree);Thin liquid ?Liquids provided via: Cup;No straw ?Medication Administration: Crushed with puree ?Supervision: Patient able to self feed;Full supervision/cueing for compensatory strategies (needs support) ?Compensations: Minimize environmental distractions;Slow rate;Small sips/bites (Rest Breaks during meals for conservation of energy) ?Postural Changes and/or Swallow Maneuvers: Out of bed for meals;Seated upright 90 degrees;Upright 30-60 min after meal  ?   ?    ?   ? ? ? ? General recommendations:  (Palliative Care f/u; Dietician f/u) ?Oral Care Recommendations: Oral care BID;Oral care before and after PO;Staff/trained caregiver to provide oral care ([place] care) ?Follow Up Recommendations: Skilled nursing-short term rehab (<3 hours/day) (TBD) ?Assistance recommended at discharge: Frequent or constant Supervision/Assistance ?SLP Visit Diagnosis: Dysphagia, oropharyngeal phase (R13.12) (in setting of medical  illness) ?Plan: Continue with current plan of care (monitor overall status) ? ? ? ? ?  ?  ? ? ? ? ?Orinda Kenner, MS, CCC-SLP ?Speech Language Pathologist ?Rehab Services; [place] ?[phone] (ascom) ?Krystal White ? ?06/29/2021, 11:34 AM ?

## 2021-06-29 NOTE — Plan of Care (Signed)

## 2021-06-29 NOTE — Progress Notes (Signed)
?   06/29/21 0417  ?Assess: MEWS Score  ?Temp 97.9 ?F (36.6 ?C)  ?BP 121/71  ?Pulse Rate (!) 115  ?Resp 16  ?SpO2 98 %  ?O2 Device Nasal Cannula  ?Patient Activity (if Appropriate) In bed  ?O2 Flow Rate (L/min) 3 L/min  ?Assess: MEWS Score  ?MEWS Temp 0  ?MEWS Systolic 0  ?MEWS Pulse 2  ?MEWS RR 0  ?MEWS LOC 0  ?MEWS Score 2  ?MEWS Score Color Yellow  ?Assess: if the MEWS score is Yellow or Red  ?Were vital signs taken at a resting state? No ?(patient just had a bowel movement)  ?Focused Assessment No change from prior assessment  ?Does the patient meet 2 or more of the SIRS criteria? No  ?MEWS guidelines implemented *See Row Information* No, vital signs rechecked  ?Treat  ?Pain Scale 0-10  ?Pain Score Asleep  ?Notify: Charge Nurse/RN  ?Name of Charge Nurse/RN Company secretary, RN  ?Date Charge Nurse/RN Notified 06/29/21  ?Time Charge Nurse/RN Notified 0430  ?Document  ?Patient Outcome Other (Comment) ?(went back to green)  ?Assess: SIRS CRITERIA  ?SIRS Temperature  0  ?SIRS Pulse 1  ?SIRS Respirations  0  ?SIRS WBC 0  ?SIRS Score Sum  1  ? ? ?

## 2021-06-30 DIAGNOSIS — I4891 Unspecified atrial fibrillation: Secondary | ICD-10-CM | POA: Diagnosis not present

## 2021-06-30 DIAGNOSIS — E870 Hyperosmolality and hypernatremia: Secondary | ICD-10-CM | POA: Diagnosis not present

## 2021-06-30 DIAGNOSIS — I5033 Acute on chronic diastolic (congestive) heart failure: Secondary | ICD-10-CM | POA: Diagnosis not present

## 2021-06-30 DIAGNOSIS — J9621 Acute and chronic respiratory failure with hypoxia: Secondary | ICD-10-CM | POA: Diagnosis not present

## 2021-06-30 LAB — CBC
HCT: 39.1 % (ref 36.0–46.0)
Hemoglobin: 12.2 g/dL (ref 12.0–15.0)
MCH: 30.8 pg (ref 26.0–34.0)
MCHC: 31.2 g/dL (ref 30.0–36.0)
MCV: 98.7 fL (ref 80.0–100.0)
Platelets: 211 10*3/uL (ref 150–400)
RBC: 3.96 MIL/uL (ref 3.87–5.11)
RDW: 13.1 % (ref 11.5–15.5)
WBC: 7.3 10*3/uL (ref 4.0–10.5)
nRBC: 0 % (ref 0.0–0.2)

## 2021-06-30 LAB — BASIC METABOLIC PANEL
Anion gap: 9 (ref 5–15)
BUN: 24 mg/dL — ABNORMAL HIGH (ref 8–23)
CO2: 40 mmol/L — ABNORMAL HIGH (ref 22–32)
Calcium: 9.1 mg/dL (ref 8.9–10.3)
Chloride: 98 mmol/L (ref 98–111)
Creatinine, Ser: 0.73 mg/dL (ref 0.44–1.00)
GFR, Estimated: >60 mL/min (ref >60)
Glucose, Bld: 115 mg/dL — ABNORMAL HIGH (ref 70–99)
Potassium: 3.8 mmol/L (ref 3.5–5.1)
Sodium: 147 mmol/L — ABNORMAL HIGH (ref 135–145)

## 2021-06-30 LAB — BRAIN NATRIURETIC PEPTIDE: B Natriuretic Peptide: 246.3 pg/mL — ABNORMAL HIGH (ref 0.0–100.0)

## 2021-06-30 MED ORDER — SODIUM CHLORIDE 0.9% FLUSH
3.0000 mL | Freq: Two times a day (BID) | INTRAVENOUS | Status: DC
  Administered 2021-06-30 – 2021-07-01 (×3): 3 mL via INTRAVENOUS

## 2021-06-30 NOTE — Progress Notes (Signed)
Speech Language Pathology Treatment:    ?Patient Details ?Name: Krystal White ?MRN: [id number] ?DOB: October 30, 1924 ?Today's Date: 06/30/2021 ?Time: 7096-2836 ?SLP Time Calculation (min) (ACUTE ONLY): 10 min ? ?Assessment / Plan / Recommendation ?Clinical Impression ? Pt seen for diet tolerance. Pt received sleeping in bed; mouthbreathing. Apparent xerostomia. On 4L/min O2 via [place]. Pt roused with verbal/tactile cues for safe participation in PO trials.  ? ?Oral care provided with items from oral care kit prior to PO trials. ? ?Pt given trials of thin liquids (via cup sip) and puree. Pt able to feed self with set up this date. Pt presents with s/sx moderate oral dysphagia c/b minimal anterior loss of liquids via cup sip and intermittent oral holding/delayed oral transit, and perseverative "munching" with puree. Oral deficits likely due to reduced labial seal and lingual weakness/incoordination. Deficits likely exacerbated by AMS. Pharyngeal swallow appeared [facility] per clinical assessment. No overt or subtle s/sx pharyngeal dysphagia noted. To palpation, seemingly timely swallow initiation and seemingly adequate laryngeal elevation. No change to vocal quality or respiratory status appreciated. Solid trials deferred due to severity of oral deficits.  ?  ?Recommend cautious initiation of a pureed diet with thin liquids with safe swallowing strategies/aspiration precautions and reflux precautions as outlined below.  ?  ?Pt is at increased risk for aspiration/aspiration PNA given AMS, cognitive deficits, multiple medical comorbidities, and respiratory status.  ?  ?SLP to f/u per POC for diet tolerance and clinical swallowing re-evaluation.  ? ?RN made aware of results, recommendations, and SLP POC. ?  ?HPI HPI: Per 86 H&P "Krystal White is a 88 year old female with history of hypothyroid, chronic atrial fibrillation on Eliquis, heart failure preserved ejection fraction, chronic respiratory failure on 2 L nasal cannula  baseline, hard of hearing, history of embolic stroke in 6294, mild cardiomegaly, biatrial enlargement, pulmonary hypertension, hypertension, hyperlipidemia, possible memory loss/changes, who presents to the emergency department via EMS from nursing facility for chief concerns of shortness of breath and confusion.    Initial vitals in the emergency department show temperature of 99.3, respiration rate of 24, heart rate of 105, blood pressure 129/72, SPO2 of 98% on 2 L nasal cannula    Serum sodium is 140, potassium 3.3, chloride of 97, bicarb of 34, BUN of 14, serum creatinine of 0.84, GFR greater than 60, nonfasting blood glucose 151, WBC 6.7, hemoglobin 12.1, platelets of 205.    BNP was elevated at 274.5.    Lactic acid was elevated 2.5 on first collection at 1520.  Second lactic acid needs to be collected at the time of this dictation.     Blood cultures x2 have been collected and is shown as in process.    COVID/influenza A/influenza B PCR were negative.     Initially on telemetry she was found to be in A-fib with RVR.    ED treatment: Cefepime, metronidazole, sodium chloride 1 L bolus, vancomycin.    EDP also ordered diltiazem 10 mg IV one-time dose, furosemide 40 mg IV, DuoNebs, Solu-Medrol which are pending administer per [facility] at the time of this dictation.  --------  At bedside patient is laying with bed elevated for aspiration precautions.  She is hard of hearing and she does open her eyes spontaneously. She is able to tell me that her name is Krystal White. She is not able to tell me her age, current location. She states she does not know.     I asked if she is hurting anywhere and she states no.  No  family is at bedside.     Social history: Patient is from nursing facility.     Vaccination history: Unknown     ROS: Unable to complete due to patient with moderate to advanced dementia" ?  ?   ?SLP Plan ? Continue with current plan of care ? ?  ?  ?Recommendations for follow up therapy are one component of a  multi-disciplinary discharge planning process, led by the attending physician.  Recommendations may be updated based on patient status, additional functional criteria and insurance authorization. ?  ? ?Recommendations  ?Diet recommendations: Dysphagia 1 (puree);Thin liquid ?Liquids provided via: Cup;No straw ?Medication Administration: Crushed with puree ?Supervision: Patient able to self feed;Full supervision/cueing for compensatory strategies (set up/supervision) ?Compensations: Minimize environmental distractions;Slow rate;Small sips/bites (rest breaks PRN for energy conservation in setting on respiratory failure) ?Postural Changes and/or Swallow Maneuvers: Out of bed for meals;Seated upright 90 degrees;Upright 30-60 min after meal  ?   ?    ?   ? ? ? ? Oral Care Recommendations: Oral care BID;Oral care before and after PO;Staff/trained caregiver to provide oral care (denture care) ?Follow Up Recommendations: Skilled nursing-short term rehab (<3 hours/day) ?Assistance recommended at discharge: Frequent or constant Supervision/Assistance ?SLP Visit Diagnosis: Dysphagia, oropharyngeal phase (R13.12) ?Plan: Continue with current plan of care ? ? ? ? ?  ?  ? ?Cherrie Gauze, M.S., CCC-SLP ?Speech-Language Pathologist ?[facility] ?[phone] ([place]) ? ?[name] ? ?06/30/2021, 10:47 AM ?

## 2021-06-30 NOTE — Assessment & Plan Note (Addendum)
Due to diuresis and poor p.o. intake.  Sodium still trending up at 147.  We will switch half-normal saline to D5 water and recheck sodium ?

## 2021-06-30 NOTE — Progress Notes (Signed)
[facility] Progress Note ? ?Patient: Krystal White    OVF:[id number]  DOA: 06/27/2021    ?Date of Service: the patient was seen and examined on 06/30/2021 ? ?Brief hospital course: ?88 year old female with past medical history of hypothyroidism, chronic atrial fibrillation on Eliquis, chronic respiratory failure on 2 L nasal cannula, mild dementia and diastolic heart failure presented to the emergency room on 5/4 from her skilled nursing facility with confusion.  Patient found to have mildly rapid A-fib and BNP of 300.  Chest x-ray noteworthy for edema versus infection but procalcitonin level normal.  There was concerns for sepsis due to rising lactic acid levels, but sepsis was ruled out given normal procalcitonin.  Lactic acidosis felt to be secondary to multiple doses of albuterol given for breathing.  Patient started on IV Lasix and admitted to the hospitalist service. ? ?Assessment and Plan: ?Assessment and Plan: ?* Acute on chronic respiratory failure with hypoxia (HCC) ?Patient's baseline is 2 L nasal cannula, currently on 3 although oxygen saturations have much improved and we will try to wean back down ? ?Acute on chronic diastolic CHF (congestive heart failure) ([place]) ?Continue IV Lasix.  Follow electrolytes.  Appears to be fully diuresed.  Recheck BNP. ? ?Atrial fibrillation with RVR ([place]) ?- Patient takes Eliquis 2.5 mg p.o. twice daily, this has been resumed.  Heart rate still slightly elevated which should improve as she diuresis.  Have added scheduled IV Lopressor to her p.o. dose. ? ?Lactic acidosis ?Secondary to albuterol.  No evidence of sepsis.  Procalcitonin level normal.  Have stopped all antibiotics. ? ?HTN (hypertension) ?- Resumed home metoprolol succinate 50 mg daily for 06/28/2021.  Blood pressure stable. ? ?Vascular dementia ([place]) ?Stable for now.  No behavioral disturbance. ? ?Dysphagia ?On dysphagia 1 diet.  Nutrition to see.  BMI of 21.5 ? ?History of emphysema ?- Status post  DuoNebs one-time dose, Solu-Medrol 125 mg, injected 62.5 mg per ED ?- Ordered Solu-Medrol 40 mg daily for 06/28/2021 ?- Resumed home DuoNebs every 6 hours ?- Resumed home Symbicort equivalent with Dulera 2 puff inhalation twice daily ? ?Hypothyroidism ?- Resumed home levothyroxine 25 mcg every morning ? ?Hypernatremia ?Due to diuresis and poor p.o. intake.  We will recheck BNP before rehydrating ? ? ? ? ? ? ?Body mass index is 21.52 kg/m?Marland Kitchen  ?Nutrition Problem: Inadequate oral intake ?Etiology: acute illness, lethargy/confusion ?   ? ?Consultants: ?None ? ?Procedures: ?None ? ?Antimicrobials: ?IV cefepime, vancomycin and Zithromax 5/4 - 5/5 ? ?Code Status: DNR ? ? ?Subjective: Patient hard of hearing, but states she does not feel good ? ?Objective: ?Vital signs were reviewed and unremarkable. ?Vitals:  ? 06/30/21 0744 06/30/21 1208  ?BP: 129/75 117/76  ?Pulse: 86 (!) 109  ?Resp: 18 18  ?Temp: 97.9 ?F (36.6 ?C) 98.1 ?F (36.7 ?C)  ?SpO2: 99% 99%  ? ? ?Intake/Output Summary (Last 24 hours) at 06/30/2021 1440 ?Last data filed at 06/30/2021 1001 ?Gross per 24 hour  ?Intake 180 ml  ?Output 0 ml  ?Net 180 ml  ? ? ?Filed Weights  ? 06/27/21 1458 06/28/21 1841  ?Weight: 80.7 kg 58.7 kg  ? ?Body mass index is 21.52 kg/m?. ? ?Exam: ? ?General: Alert and oriented x1, fatigued ?HEENT: Normocephalic, atraumatic, mucous members are slightly dry ?Cardiovascular: Irregular rhythm, borderline tachycardia ?Respiratory: Decreased breath sounds bibasilar ?Abdomen: Soft, nontender, nondistended, positive bowel sounds ?Musculoskeletal: No clubbing or cyanosis, 1+ pitting edema ?Skin: No skin breaks, tears or lesions ?Psychiatry: Underlying dementia ?Neurology: No focal  deficits ? ?Data Reviewed: ?Potassium better.  Sodium of 147.  BUN starting to increase slightly. ? ?Disposition:  ?Status is: Inpatient ?  ? ?Anticipated discharge date: 5/8 or 5/9 ? ?Remaining issues to be resolved so that patient can be discharged: Improvement in  sodium ? ? ?Family Communication: Left message for family ?DVT Prophylaxis: ?apixaban (ELIQUIS) tablet 2.5 mg Start: 06/27/21 2200 ?Place TED hose Start: 06/27/21 1839 ?apixaban (ELIQUIS) tablet 2.5 mg  ? ? ?Author: ?Annita Brod ,MD ?06/30/2021 2:40 PM ? ?To reach On-call, see care teams to locate the attending and reach out via [email or web address]. ?Between 7PM-7AM, please contact night-coverage ?If you still have difficulty reaching the attending provider, please page the [facility] (Director on Call) for [facility] on amion for assistance. ? ?

## 2021-07-01 LAB — BASIC METABOLIC PANEL
Anion gap: 8 (ref 5–15)
BUN: 28 mg/dL — ABNORMAL HIGH (ref 8–23)
CO2: 42 mmol/L — ABNORMAL HIGH (ref 22–32)
Calcium: 9 mg/dL (ref 8.9–10.3)
Chloride: 96 mmol/L — ABNORMAL LOW (ref 98–111)
Creatinine, Ser: 0.68 mg/dL (ref 0.44–1.00)
GFR, Estimated: >60 mL/min (ref >60)
Glucose, Bld: 113 mg/dL — ABNORMAL HIGH (ref 70–99)
Potassium: 3.4 mmol/L — ABNORMAL LOW (ref 3.5–5.1)
Sodium: 146 mmol/L — ABNORMAL HIGH (ref 135–145)

## 2021-07-01 MED ORDER — ENSURE ENLIVE PO LIQD
237.0000 mL | Freq: Three times a day (TID) | ORAL | Status: DC
  Administered 2021-07-01 – 2021-07-03 (×5): 237 mL via ORAL

## 2021-07-01 MED ORDER — SODIUM CHLORIDE 0.45 % IV SOLN: INTRAVENOUS | Status: DC

## 2021-07-01 NOTE — Progress Notes (Signed)
SLP Cancellation Note ? ?Patient Details ?Name: Krystal White ?MRN: [id number] ?DOB: November 28, 1924 ? ? ?Cancelled treatment:       Reason Eval/Treat Not Completed: Fatigue/lethargy limiting ability to participate  ? ?Attempted to rouse pt for diet tolerance/trials of upgraded textures. Breakfast tray at bedside. Pt received sleeping in bed. Despite verbal/tactile cues, pt unable to rouse for safe PO intake. Will continue efforts as appropriate. ? ?Cherrie Gauze, M.S., CCC-SLP ?Speech-Language Pathologist ?[facility] ?[phone] ([place])  ? ?[name] ?07/01/2021, 9:40 AM ?

## 2021-07-01 NOTE — TOC Progression Note (Signed)
Transition of Care (TOC) - Progression Note  ? ? ?Patient Details  ?Name: Krystal White ?MRN: [id number] ?Date of Birth: 10/06/1924 ? ?Transition of Care (TOC) CM/SW Contact  ?Laurena Slimmer, RN ?Phone Number: ?07/01/2021, 3:42 PM ? ?Clinical Narrative:    ?Attempt to contact [place] assigned DSS representative. Left a message and sent a email regarding discharge.  ? ? ?Expected Discharge Plan: [place] ?Barriers to Discharge: Continued Medical Work up ? ?Expected Discharge Plan and Services ?Expected Discharge Plan: [place] ?  ?Discharge Planning Services: CM Consult ?Post Acute Care Choice: Resumption of Svcs/PTA Provider, [place] ?Living arrangements for the past 2 months: [place] ?                ?DME Arranged: N/A ?DME Agency: NA ?  ?  ?  ?HH Arranged: NA ?[place] Agency: NA ?  ?  ?  ? ? ?Social Determinants of Health (SDOH) Interventions ?  ? ?Readmission Risk Interventions ?   ? View : No data to display.  ?  ?  ?  ? ? ?

## 2021-07-01 NOTE — Progress Notes (Signed)
Speech Language Pathology Treatment:    ?Patient Details ?Name: Krystal White ?MRN: [id number] ?DOB: 12-27-1924 ?Today's Date: 07/01/2021 ?Time: 6629-4765 ?SLP Time Calculation (min) (ACUTE ONLY): 10 min ? ?Assessment / Plan / Recommendation ?Clinical Impression ? Pt seen for clinical swallowing re-evaluation. Pt alert and pleasant. HOH. Confusion noted. Pt on 3L/min O2 via [place] (on 2L/min O2 at baseline per chart).  ?  ?Per chart review, temp and WBC WNL. No recent chest imaging.  ?  ?Pt given trials of thin liquids (via straw; cup), puree, and solid. Pt presents with s/sx moderate oral dysphagia c/b inability to siphon of liquids from straw,prolonged/inefficient mastication of solids with moderate oral residual which reduced with several cued sips of liquid wash, and perseverative lingual movements/"munching" following solid trials. Oral deficits likely due to reduced labial seal and lingual weakness/incoordination. Deficits likely exacerbated by AMS. Pharyngeal swallow appeared [facility] per clinical assessment. No overt or subtle s/sx pharyngeal dysphagia noted. To palpation, seemingly timely swallow initiation and seemingly adequate laryngeal elevation. No change to vocal quality or respiratory status appreciated. Pt able to feed self with setup. ?  ?Recommend continuation of a pureed diet with thin liquids with safe swallowing strategies/aspiration precautions and reflux precautions as outlined below.  ?  ?Pt is at increased risk for aspiration/aspiration PNA given AMS, cognitive deficits, multiple medical comorbidities, and respiratory status. ?  ?Given limited functional change in pt's swallow function since admission, SLP to sign off at this time. Further diet advancement may be completed at next level of care. ? ?RN made aware of results, recommendations, and SLP POC.  ?   ?HPI HPI: Per 15 H&P "Jonnie Kubly is a 88 year old female with history of hypothyroid, chronic atrial fibrillation on Eliquis,  heart failure preserved ejection fraction, chronic respiratory failure on 2 L nasal cannula baseline, hard of hearing, history of embolic stroke in 4650, mild cardiomegaly, biatrial enlargement, pulmonary hypertension, hypertension, hyperlipidemia, possible memory loss/changes, who presents to the emergency department via EMS from nursing facility for chief concerns of shortness of breath and confusion.    Initial vitals in the emergency department show temperature of 99.3, respiration rate of 24, heart rate of 105, blood pressure 129/72, SPO2 of 98% on 2 L nasal cannula    Serum sodium is 140, potassium 3.3, chloride of 97, bicarb of 34, BUN of 14, serum creatinine of 0.84, GFR greater than 60, nonfasting blood glucose 151, WBC 6.7, hemoglobin 12.1, platelets of 205.    BNP was elevated at 274.5.    Lactic acid was elevated 2.5 on first collection at 1520.  Second lactic acid needs to be collected at the time of this dictation.     Blood cultures x2 have been collected and is shown as in process.    COVID/influenza A/influenza B PCR were negative.     Initially on telemetry she was found to be in A-fib with RVR.    ED treatment: Cefepime, metronidazole, sodium chloride 1 L bolus, vancomycin.    EDP also ordered diltiazem 10 mg IV one-time dose, furosemide 40 mg IV, DuoNebs, Solu-Medrol which are pending administer per [facility] at the time of this dictation.  --------  At bedside patient is laying with bed elevated for aspiration precautions.  She is hard of hearing and she does open her eyes spontaneously. She is able to tell me that her name is Krystal White. She is not able to tell me her age, current location. She states she does not know.     I  asked if she is hurting anywhere and she states no.  No family is at bedside.     Social history: Patient is from nursing facility.     Vaccination history: Unknown     ROS: Unable to complete due to patient with moderate to advanced dementia" ?  ?   ?SLP Plan ? All goals met ? ?  ?   ?Recommendations for follow up therapy are one component of a multi-disciplinary discharge planning process, led by the attending physician.  Recommendations may be updated based on patient status, additional functional criteria and insurance authorization. ?  ? ?Recommendations  ?Diet recommendations: Dysphagia 1 (puree);Thin liquid ?Liquids provided via: Cup;No straw ?Medication Administration: Crushed with puree ?Supervision: Patient able to self feed;Full supervision/cueing for compensatory strategies (setup/supervision) ?Compensations: Minimize environmental distractions;Slow rate;Small sips/bites (rest breaks PRN for energy conservation in setting on respiratory failure) ?Postural Changes and/or Swallow Maneuvers: Out of bed for meals;Seated upright 90 degrees;Upright 30-60 min after meal  ?   ?    ?   ? ? ? ? General recommendations:  (Palliative Consult; Registered Dietician) ?Oral Care Recommendations: Oral care BID;Oral care before and after PO;Staff/trained caregiver to provide oral care (denture care) ?Follow Up Recommendations: Skilled nursing-short term rehab (<3 hours/day) ?Assistance recommended at discharge: Frequent or constant Supervision/Assistance ?SLP Visit Diagnosis: Dysphagia, oropharyngeal phase (R13.12) ?Plan: All goals met ? ? ? ? ?  ?  ?Cherrie Gauze, M.S., CCC-SLP ?Speech-Language Pathologist ?[facility] ?[phone] ([place]) ? ? ?[name] ? ?07/01/2021, 11:09 AM ?

## 2021-07-01 NOTE — Progress Notes (Signed)
[facility] Progress Note ? ?Patient: Krystal White    WUJ:[id number]  DOA: 06/27/2021    ?Date of Service: the patient was seen and examined on 07/01/2021 ? ?Brief hospital course: ?88 year old female with past medical history of hypothyroidism, chronic atrial fibrillation on Eliquis, chronic respiratory failure on 2 L nasal cannula, mild dementia and diastolic heart failure presented to the emergency room on 5/4 from her skilled nursing facility with confusion.  Patient found to have mildly rapid A-fib and BNP of 300.  Chest x-ray noteworthy for edema versus infection but procalcitonin level normal.  There was concerns for sepsis due to rising lactic acid levels, but sepsis was ruled out given normal procalcitonin.  Lactic acidosis felt to be secondary to multiple doses of albuterol given for breathing.  Patient started on IV Lasix and admitted to the hospitalist service.  By 5/8, likely fully diuresed, and slightly dry.  Able to be weaned to her baseline of 2 L.  Started on some gentle half-normal saline for hypernatremia. ? ?Assessment and Plan: ?Assessment and Plan: ?* Acute on chronic respiratory failure with hypoxia (HCC)-resolved as of 07/01/2021 ?Back to baseline 2 L nasal cannula following diuresis. ? ?Hypernatremia ?Due to diuresis and poor p.o. intake.  Have started gentle half-normal saline.  Recheck labs in the morning. ? ?Acute on chronic diastolic CHF (congestive heart failure) (HCC)-resolved as of 07/01/2021 ?Appears to be fully diuresed.  Now slightly dry. ? ?Atrial fibrillation with RVR ([place]) ?- Patient takes Eliquis 2.5 mg p.o. twice daily, this has been resumed.  Heart rate still slightly elevated which should improve as she diuresis.  Have added scheduled IV Lopressor to her p.o. dose. ? ?Lactic acidosis ?Secondary to albuterol.  No evidence of sepsis.  Procalcitonin level normal.  Have stopped all antibiotics. ? ?HTN (hypertension) ?- Resumed home metoprolol succinate 50 mg daily for  06/28/2021.  Blood pressure stable. ? ?Vascular dementia ([place]) ?Stable for now.  No behavioral disturbance. ? ?Dysphagia ?On dysphagia 1 diet.  Nutrition to see.  BMI of 21.5 ? ?History of emphysema ?- Status post DuoNebs one-time dose, Solu-Medrol 125 mg, injected 62.5 mg per ED ?- Ordered Solu-Medrol 40 mg daily for 06/28/2021 ?- Resumed home DuoNebs every 6 hours ?- Resumed home Symbicort equivalent with Dulera 2 puff inhalation twice daily ? ?Hypothyroidism ?- Resumed home levothyroxine 25 mcg every morning ? ? ? ? ? ? ?Body mass index is 19.11 kg/m?Marland Kitchen  ?Nutrition Problem: Inadequate oral intake ?Etiology: acute illness, lethargy/confusion ?   ? ?Consultants: ?None ? ?Procedures: ?None ? ?Antimicrobials: ?IV cefepime, vancomycin and Zithromax 5/4 - 5/5 ? ?Code Status: DNR ? ? ?Subjective: Patient hard of hearing, but states she is tired ? ?Objective: ?Vital signs were reviewed and unremarkable. ?Vitals:  ? 07/01/21 1149 07/01/21 1510  ?BP:  130/79  ?Pulse:  (!) 103  ?Resp:  18  ?Temp:  98.1 ?F (36.7 ?C)  ?SpO2: 100% 97%  ? ? ?Intake/Output Summary (Last 24 hours) at 07/01/2021 1756 ?Last data filed at 07/01/2021 0531 ?Gross per 24 hour  ?Intake 30 ml  ?Output 50 ml  ?Net -20 ml  ? ? ?Filed Weights  ? 06/27/21 1458 06/28/21 1841 07/01/21 0534  ?Weight: 80.7 kg 58.7 kg 52.1 kg  ? ?Body mass index is 19.11 kg/m?. ? ?Exam: ? ?General: Alert and oriented x1, fatigued ?HEENT: Normocephalic, atraumatic, mucous members are slightly dry ?Cardiovascular: Irregular rhythm, borderline tachycardia ?Respiratory: Clear to auscultation bilaterally ?Abdomen: Soft, nontender, nondistended, positive bowel sounds ?Musculoskeletal: No clubbing  or cyanosis, 1+ pitting edema ?Skin: No skin breaks, tears or lesions ?Psychiatry: Underlying dementia ?Neurology: No focal deficits ? ?Data Reviewed: ?Sodium of 146, potassium 3.4.  BUN of 28 now. ? ?Disposition:  ?Status is: Inpatient ?  ? ?Anticipated discharge date: 5/9 ? ?Remaining issues to be  resolved so that patient can be discharged: Improvement in sodium ? ? ?Family Communication: Left message for family ?DVT Prophylaxis: ?apixaban (ELIQUIS) tablet 2.5 mg Start: 06/27/21 2200 ?Place TED hose Start: 06/27/21 1839 ?apixaban (ELIQUIS) tablet 2.5 mg  ? ? ?Author: ?Annita Brod ,MD ?07/01/2021 5:56 PM ? ?To reach On-call, see care teams to locate the attending and reach out via [email or web address]. ?Between 7PM-7AM, please contact night-coverage ?If you still have difficulty reaching the attending provider, please page the [facility] (Director on Call) for [facility] on amion for assistance. ? ?

## 2021-07-01 NOTE — Progress Notes (Signed)
Nutrition Follow-up ? ?DOCUMENTATION CODES:  ? ?Not applicable ? ?INTERVENTION:  ? ?-D/c Boost Breeze ?-MVI with minerals daily ?-Ensure Enlive po BID, each supplement provides 350 kcal and 20 grams of protein ?-Hormel Shake TID with meals, each supplement provides 520 kcals and 22 grams protein ?-Feeding assistance with meals ? ?NUTRITION DIAGNOSIS:  ? ?Inadequate oral intake related to acute illness, lethargy/confusion as evidenced by meal completion < 25%. ? ?Ongoing ? ?GOAL:  ? ?Patient will meet greater than or equal to 90% of their needs ? ?Progressing  ? ?MONITOR:  ? ?PO intake, Supplement acceptance, Labs, Weight trends ? ?REASON FOR ASSESSMENT:  ? ?Consult ?Assessment of nutrition requirement/status ? ?ASSESSMENT:  ? ?87 year old female with medical history of hypothyroidism, afib on Eliquis, CHF, chronic respiratory failure on 2L at baseline, hard of hearing, history of embolic stroke in 9562, mild cardiomegaly, biatrial enlargement, pulmonary hypertension, HTN, HLD, and possible memory loss/changes. She presented to the ED via EMS from nursing facility due to shortness of breath and confusion. In the ED she was noted to be in afib with RVR. ? ?5/6- s/p BSE- advanced to dysphagia 1 diet with thin liquids ?  ?Reviewed I/O's: +60 ml x 24 hours and +1.1 L since admission ? ?UOP: 50 ml x 24 hours ? ?Pt sitting up in bed at time of visit. She did not respond to voice or touch.  ? ?Pt with variable oral intake. Noted meal completions 0-80%.  ? ?Medications reviewed and include vitamin D3.  ? ?Labs reviewed: Na: 146, K: 3.4, CBGS: 117 (inpatient orders for glycemic control are none).   ? ?NUTRITION - FOCUSED PHYSICAL EXAM: ? ?Flowsheet Row Most Recent Value  ?Orbital Region No depletion  ?Upper Arm Region Mild depletion  ?Thoracic and Lumbar Region No depletion  ?Buccal Region No depletion  ?Temple Region Mild depletion  ?Clavicle Bone Region No depletion  ?Clavicle and Acromion Bone Region No depletion   ?Scapular Bone Region No depletion  ?Dorsal Hand Mild depletion  ?Patellar Region Mild depletion  ?Anterior Thigh Region Mild depletion  ?Posterior Calf Region Mild depletion  ?Edema (RD Assessment) None  ?Hair Reviewed  ?Eyes Reviewed  ?Mouth Reviewed  ?Skin Reviewed  ?Nails Reviewed  ? ?  ? ? ?Diet Order:   ?Diet Order   ? ?       ?  DIET - DYS 1 Room service appropriate? No; Fluid consistency: Thin  Diet effective now       ?  ? ?  ?  ? ?  ? ? ?EDUCATION NEEDS:  ? ?No education needs have been identified at this time ? ?Skin:  Skin Assessment: Reviewed RN Assessment ? ?Last BM:  5/6 (type 6 x2, both medium amount; type 7 x1, medium amount) ? ?Height:  ? ?Ht Readings from Last 1 Encounters:  ?06/27/21 '5\' 5"'$  (1.651 m)  ? ? ?Weight:  ? ?Wt Readings from Last 1 Encounters:  ?07/01/21 52.1 kg  ? ? ?Ideal Body Weight:  56.8 kg ? ?BMI:  Body mass index is 19.11 kg/m?. ? ?Estimated Nutritional Needs:  ? ?Kcal:  1300-1550 kcal ? ?Protein:  65-75 grams ? ?Fluid:  >/= 1.4 L/day ? ? ? ?Loistine Chance, RD, LDN, CDCES ?Registered Dietitian II ?Certified Diabetes Care and Education Specialist ?Please refer to [facility] for RD and/or RD on-call/weekend/after hours pager  ?

## 2021-07-01 NOTE — Care Management Important Message (Signed)
Important Message ? ?Patient Details  ?Name: Krystal White ?MRN: [id number] ?Date of Birth: [birth year] ? ? ?Medicare Important Message Given:  N/A - LOS <3 / Initial given by admissions ? ? ? ? ?Dannette Barbara ?07/01/2021, 9:14 AM ?

## 2021-07-02 LAB — BASIC METABOLIC PANEL
Anion gap: 7 (ref 5–15)
BUN: 35 mg/dL — ABNORMAL HIGH (ref 8–23)
CO2: 42 mmol/L — ABNORMAL HIGH (ref 22–32)
Calcium: 9.3 mg/dL (ref 8.9–10.3)
Chloride: 98 mmol/L (ref 98–111)
Creatinine, Ser: 0.7 mg/dL (ref 0.44–1.00)
GFR, Estimated: >60 mL/min (ref >60)
Glucose, Bld: 115 mg/dL — ABNORMAL HIGH (ref 70–99)
Potassium: 3.4 mmol/L — ABNORMAL LOW (ref 3.5–5.1)
Sodium: 147 mmol/L — ABNORMAL HIGH (ref 135–145)

## 2021-07-02 LAB — CULTURE, BLOOD (ROUTINE X 2)
Culture: NO GROWTH
Culture: NO GROWTH
Special Requests: ADEQUATE

## 2021-07-02 MED ORDER — DEXTROSE 5 % IV SOLN: INTRAVENOUS | Status: DC

## 2021-07-02 MED ORDER — POTASSIUM CHLORIDE CRYS ER 20 MEQ PO TBCR
40.0000 meq | EXTENDED_RELEASE_TABLET | Freq: Once | ORAL | Status: AC
  Administered 2021-07-02: 40 meq via ORAL
  Filled 2021-07-02: qty 2

## 2021-07-02 NOTE — Progress Notes (Signed)
?Progress Note ? ? ?Patient: Krystal White WUJ:[id number] DOB: [birth year] DOA: 06/27/2021     4 ?DOS: the patient was seen and examined on 07/02/2021 ?  ?Brief hospital course: ?88 year old female with past medical history of hypothyroidism, chronic atrial fibrillation on Eliquis, chronic respiratory failure on 2 L nasal cannula, mild dementia and diastolic heart failure presented to the emergency room on 5/4 from her skilled nursing facility with confusion.  Patient found to have mildly rapid A-fib and BNP of 300.  Chest x-ray noteworthy for edema versus infection but procalcitonin level normal.  There was concerns for sepsis due to rising lactic acid levels, but sepsis was ruled out given normal procalcitonin.  Lactic acidosis felt to be secondary to multiple doses of albuterol given for breathing.  Patient started on IV Lasix and admitted to the hospitalist service.  By 5/8, likely fully diuresed, and slightly dry.  Able to be weaned to her baseline of 2 L.  Started on some gentle half-normal saline for hypernatremia. ? ?5/9: Sodium still trending up at 147, potassium 3.4 ? ? ?Assessment and Plan: ?* Acute on chronic respiratory failure with hypoxia (HCC)-resolved as of 07/01/2021 ?Back to baseline 2 L nasal cannula following diuresis. ? ?Hypernatremia ?Due to diuresis and poor p.o. intake.  Sodium still trending up at 147.  We will switch half-normal saline to D5 water and recheck sodium ? ?Acute on chronic diastolic CHF (congestive heart failure) (HCC)-resolved as of 07/01/2021 ?Appears to be fully diuresed.  Now slightly dry. ? ?Atrial fibrillation with RVR ([place]) ?- Patient takes Eliquis 2.5 mg p.o. twice daily, this has been resumed.  Heart rate is controlled with metoprolol ? ?Lactic acidosis ?Secondary to albuterol.  No evidence of sepsis.  Procalcitonin level normal.  Have stopped all antibiotics ? ?HTN (hypertension) ?- Continue metoprolol succinate 50 mg daily.  Blood pressure stable. ? ?Vascular dementia  ([place]) ?Stable for now.  No behavioral disturbance ? ?Dysphagia ?On dysphagia 1 diet.  BMI of 21.5.  Overall very poor prognosis ? ?History of emphysema ?Stable for now on current regimen ? ?Hypothyroidism ?- Continue home levothyroxine 25 mcg every morning ? ?DNR (do not resuscitate) ?Consult palliative care for goals of care discussion.   ? ? ? ? ?  ? ?Subjective: Extremely hard of hearing.  No new issues other than lab abnormalities.  poor p.o. intake ? ?Physical Exam: ?Vitals:  ? 07/02/21 0417 07/02/21 0801 07/02/21 1127 07/02/21 1713  ?BP: (!) 147/85 131/73 (!) 144/54 103/83  ?Pulse: 86 100 82 81  ?Resp:   17 17  ?Temp: 98.3 ?F (36.8 ?C) 97.9 ?F (36.6 ?C) 97.8 ?F (36.6 ?C) 97.7 ?F (36.5 ?C)  ?TempSrc:  Axillary Axillary Axillary  ?SpO2: 96% 94% 95% 96%  ?Weight:      ?Height:      ? ?? General: Alert and oriented x1, hard of hearing ?? HEENT: Normocephalic, atraumatic, mucous members are slightly dry ?? Cardiovascular: Irregular rhythm, borderline tachycardia ?? Respiratory: Clear to auscultation bilaterally ?? Abdomen: Soft, nontender, nondistended, positive bowel sounds ?? Musculoskeletal: No clubbing or cyanosis, 1+ pitting edema ?? Skin: No skin breaks, tears or lesions ?? Psychiatry: Underlying dementia ?? Neurology: No focal deficits ??  ?Data Reviewed: ? ?Sodium 147, potassium 3.4 ? ?Family Communication: None ? ?Disposition: ?Status is: Inpatient ?Remains inpatient appropriate because: Managing electrolytes and having goals of care conversation ? ? Planned Discharge Destination: Skilled nursing facility ? ? ? DVT prophylaxis-Eliquis ?Time spent: 35 minutes ? ?Author: ?Max Sane, MD ?07/02/2021  5:56 PM ? ?For on call review [email or web address].  ?

## 2021-07-02 NOTE — TOC Progression Note (Signed)
Transition of Care (TOC) - Progression Note  ? ? ?Patient Details  ?Name: Krystal White ?MRN: [id number] ?Date of Birth: 02-09-1925 ? ?Transition of Care (TOC) CM/SW Contact  ?Laurena Slimmer, RN ?Phone Number: ?07/02/2021, 3:46 PM ? ?Clinical Narrative:    ?Email sent to [place], LaPorscha McCollough about patient d/c back to [facility] ? ? ?Expected Discharge Plan: [place] ?Barriers to Discharge: Continued Medical Work up ? ?Expected Discharge Plan and Services ?Expected Discharge Plan: [place] ?  ?Discharge Planning Services: CM Consult ?Post Acute Care Choice: Resumption of Svcs/PTA Provider, [place] ?Living arrangements for the past 2 months: [place] ?                ?DME Arranged: N/A ?DME Agency: NA ?  ?  ?  ?HH Arranged: NA ?[place] Agency: NA ?  ?  ?  ? ? ?Social Determinants of Health (SDOH) Interventions ?  ? ?Readmission Risk Interventions ?   ? View : No data to display.  ?  ?  ?  ? ? ?

## 2021-07-02 NOTE — Assessment & Plan Note (Addendum)
Consult palliative care for goals of care discussion.   ?

## 2021-07-03 LAB — CBC
HCT: 38.1 % (ref 36.0–46.0)
Hemoglobin: 12.2 g/dL (ref 12.0–15.0)
MCH: 31.2 pg (ref 26.0–34.0)
MCHC: 32 g/dL (ref 30.0–36.0)
MCV: 97.4 fL (ref 80.0–100.0)
Platelets: 209 10*3/uL (ref 150–400)
RBC: 3.91 MIL/uL (ref 3.87–5.11)
RDW: 12.9 % (ref 11.5–15.5)
WBC: 9.9 10*3/uL (ref 4.0–10.5)
nRBC: 0 % (ref 0.0–0.2)

## 2021-07-03 LAB — BASIC METABOLIC PANEL
Anion gap: 5 (ref 5–15)
BUN: 26 mg/dL — ABNORMAL HIGH (ref 8–23)
CO2: 42 mmol/L — ABNORMAL HIGH (ref 22–32)
Calcium: 9.1 mg/dL (ref 8.9–10.3)
Chloride: 92 mmol/L — ABNORMAL LOW (ref 98–111)
Creatinine, Ser: 0.64 mg/dL (ref 0.44–1.00)
GFR, Estimated: >60 mL/min (ref >60)
Glucose, Bld: 118 mg/dL — ABNORMAL HIGH (ref 70–99)
Potassium: 3.5 mmol/L (ref 3.5–5.1)
Sodium: 139 mmol/L (ref 135–145)

## 2021-07-03 MED ORDER — MORPHINE SULFATE (CONCENTRATE) 10 MG /0.5 ML PO SOLN: 5.0000 mg | ORAL | 0 refills | Status: AC | PRN

## 2021-07-03 MED ORDER — LORAZEPAM 0.5 MG PO TABS: 0.5000 mg | ORAL_TABLET | Freq: Three times a day (TID) | ORAL | 0 refills | Status: AC | PRN

## 2021-07-03 NOTE — Discharge Summary (Signed)
?Physician Discharge Summary ?  ?Patient: Krystal White MRN: [id number] DOB: February 20, 1925  ?Admit date:     06/27/2021  ?Discharge date: 07/03/2021  ?Discharge Physician: Max Sane  ? ?PCP: Juluis Pitch, MD  ? ?Recommendations at discharge:  ? ? Hospice at [facility] ? ?Discharge Diagnoses: ?Active Problems: ?  Hypernatremia ?  Atrial fibrillation with RVR ([place]) ?  Lactic acidosis ?  HTN (hypertension) ?  Vascular dementia ([place]) ?  Dysphagia ?  History of emphysema ?  Hypothyroidism ?  History of embolic stroke ?  DNR (do not resuscitate) ? ?Principal Problem (Resolved): ?  Acute on chronic respiratory failure with hypoxia (HCC) ?Resolved Problems: ?  Acute on chronic diastolic CHF (congestive heart failure) ([place]) ? ?Hospital Course: ?88 year old female with past medical history of hypothyroidism, chronic atrial fibrillation on Eliquis, chronic respiratory failure on 2 L nasal cannula, mild dementia and diastolic heart failure presented to the emergency room on 5/4 from her skilled nursing facility with confusion.  Patient found to have mildly rapid A-fib and BNP of 300.  Chest x-ray noteworthy for edema versus infection but procalcitonin level normal.  There was concerns for sepsis due to rising lactic acid levels, but sepsis was ruled out given normal procalcitonin.  Lactic acidosis felt to be secondary to multiple doses of albuterol given for breathing.  Patient started on IV Lasix and admitted to the hospitalist service.  By 5/8, likely fully diuresed, and slightly dry.  Able to be weaned to her baseline of 2 L.  Started on some gentle half-normal saline for hypernatremia. ? ?5/9: Sodium still trending up at 147, potassium 3.4 ?5/10 - multiple failed attempts at reaching patient's listed legal guardian and their supervisor over last few days. I d/w son who is in full agreement to keep her comfortable and have Hospice at the facility. ? ?Assessment and Plan: ?* Acute on chronic respiratory failure with  hypoxia (HCC)-resolved as of 07/01/2021 ?Back to baseline 2 L nasal cannula following diuresis. ? ?Hypernatremia ?Due to diuresis and poor p.o. intake. Now resolved/ ? ?Acute on chronic diastolic CHF (congestive heart failure) (HCC)-resolved as of 07/01/2021 ?Atrial fibrillation with RVR ([place]) ?Lactic acidosis ?Secondary to albuterol.  No evidence of sepsis.  Procalcitonin level normal.  Have stopped all antibiotics ? ?HTN (hypertension) ?Vascular dementia ([place]) ?Dysphagia ?On dysphagia 1 diet.  BMI of 21.5.  Overall very poor prognosis ? ?History of emphysema ?Hypothyroidism ?- Continue home levothyroxine 25 mcg every morning ? ?DNR (do not resuscitate) ?Hospice at the facility ? ? ? ? ?  ? ?Disposition: Hospice care ?Diet recommendation:  ?Discharge Diet Orders (From admission, onward)  ? ?  Start     Ordered  ? 07/03/21 0000  Diet - low sodium heart healthy       ? 07/03/21 1320  ? ?  ?  ? ?  ? ?Regular diet ?DISCHARGE MEDICATION: ?Allergies as of 07/03/2021   ?No Known Allergies ?  ? ?  ?Medication List  ?  ? ?STOP taking these medications   ? ?acetaminophen 500 MG tablet ?Commonly known as: TYLENOL ?  ?apixaban 2.5 MG Tabs tablet ?Commonly known as: ELIQUIS ?  ?azelastine 0.1 % nasal spray ?Commonly known as: ASTELIN ?  ?budesonide-formoterol 160-4.5 MCG/ACT inhaler ?Commonly known as: Symbicort ?  ?carboxymethylcellulose 0.5 % Soln ?Commonly known as: REFRESH PLUS ?  ?cholecalciferol 25 MCG (1000 UNIT) tablet ?Commonly known as: VITAMIN D3 ?  ?dextromethorphan-guaiFENesin 30-600 MG 12hr tablet ?Commonly known as: [place] DM ?  ?  furosemide 20 MG tablet ?Commonly known as: LASIX ?  ?furosemide 40 MG tablet ?Commonly known as: LASIX ?  ?ipratropium-albuterol 0.5-2.5 (3) MG/3ML Soln ?Commonly known as: DUONEB ?  ?metoprolol succinate 50 MG 24 hr tablet ?Commonly known as: TOPROL-XL ?  ?multivitamin with minerals tablet ?  ? ?  ? ?TAKE these medications   ? ?levothyroxine 25 MCG tablet ?Commonly known as:  SYNTHROID ?Take 25 mcg by mouth in the morning. ?  ?LORazepam 0.5 MG tablet ?Commonly known as: Ativan ?Take 1 tablet (0.5 mg total) by mouth every 8 (eight) hours as needed for up to 3 days for anxiety. ?  ?morphine CONCENTRATE 10 mg / 0.5 ml concentrated solution ?Take 0.25 mLs (5 mg total) by mouth every 2 (two) hours as needed for up to 3 days for severe pain. ?  ? ?  ? ? ?Discharge Exam: ?Filed Weights  ? 06/27/21 1458 06/28/21 1841 07/01/21 0534  ?Weight: 80.7 kg 58.7 kg 52.1 kg  ? ?General: Alert and oriented x1, hard of hearing, severe cachexia ?HEENT: Normocephalic, atraumatic, mucous members are slightly dry ?Cardiovascular: Irregular rhythm, borderline tachycardia ?Respiratory: Clear to auscultation bilaterally ?Abdomen: Soft, nontender, nondistended, positive bowel sounds ?Musculoskeletal: No clubbing or cyanosis, 1+ pitting edema ?Skin: No skin breaks, tears or lesions ?Psychiatry: Underlying dementia ?Neurology: No focal deficits ? ?Condition at discharge: fair ? ?The results of significant diagnostics from this hospitalization (including imaging, microbiology, ancillary and laboratory) are listed below for reference.  ? ?Imaging Studies: ?CT Chest Wo Contrast ? ?Result Date: 06/27/2021 ?CLINICAL DATA:  Respiratory illness EXAM: CT CHEST WITHOUT CONTRAST TECHNIQUE: Multidetector CT imaging of the chest was performed following the standard protocol without IV contrast. RADIATION DOSE REDUCTION: This exam was performed according to the departmental dose-optimization program which includes automated exposure control, adjustment of the mA and/or kV according to patient size and/or use of iterative reconstruction technique. COMPARISON:  Chest x-ray 06/27/2021, CT chest 03/12/2017 FINDINGS: Cardiovascular: Limited without intravenous contrast. Advanced aortic atherosclerosis. No aneurysm. Coronary vascular calcification. Mild cardiomegaly. Biatrial enlargement. No significant pericardial effusion.  Mediastinum/Nodes: Midline trachea. No thyroid mass. No suspicious lymph nodes. Esophagus unremarkable. Lungs/Pleura: Advanced emphysema. Irregular scarring at the apices. Limited parenchymal assessment due to significant respiratory motion artifact. Diffuse bilateral bronchial wall thickening with probable areas of mucous plugging. No consolidation, pleural effusion, or pneumothorax. Flattened appearance of the tracheobronchial airway Upper Abdomen: No acute abnormality. Musculoskeletal: Sternum is intact. Chronic superior endplate fractures at T3 and T4. Chronic compression fracture T8. Moderate severe compression fracture T11, new since 2019. Post augmentation changes at T12. IMPRESSION: 1. Limited by respiratory motion degradation. Emphysema. Diffuse bilateral bronchial wall thickening with mild mucous plugging suggesting central airways inflammatory process. No focal pneumonia is seen. 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 Mild cardiomegaly with biatrial enlargement 3. Chronic compression fractures at T3, T4, T8 and T12. Moderate severe compression fracture at T11, new since 2019 Aortic Atherosclerosis (ICD10-I70.0) and Emphysema (ICD10-J43.9). Electronically Signed   By: Donavan Foil M.D.   On: 06/27/2021 18:18  ? ?DG Chest Port 1 View ? ?Result Date: 06/27/2021 ?CLINICAL DATA:  Sepsis EXAM: PORTABLE CHEST 1 VIEW COMPARISON:  04/11/2017 FINDINGS: Mild cardiomegaly, stable. Densely calcified thoracic aorta. Coarsened interstitial markings bilaterally with subtle airspace opacities within the mid to upper lung fields bilaterally. No pleural effusion or pneumothorax. Chronic severe compression fracture within the midthoracic spine, not well evaluated by frontal view. IMPRESSION: Coarsened interstitial markings bilaterally with subtle airspace opacities  within the mid to upper lung fields bilaterally. Findings may reflect pulmonary edema versus atypical/viral  infection. Electronically Signed   By: Davina Poke D.O.   On: 06/27/2021 15:45   ? ?Microbiology: ?Results for orders placed or performed during the hospital encounter of 06/27/21  ?Blood Culture (routine x 2)     Status:

## 2021-07-03 NOTE — Progress Notes (Signed)
Report given to Kristen at [facility]. Patient being transported via EMS.  ?

## 2021-07-03 NOTE — IPAL (Signed)
?  Interdisciplinary Goals of Care Family Meeting ? ? ?Date carried out: 07/03/2021 ? ?Location of the meeting: Phone conference ? ?Member's involved: Physician and Family Member or next of kin ? ?Durable Power of Tour manager: Son (multiple failed attempts to contact legal guardian and her supervisor over last few days). Son is in full agreement and thinks her legal guardian will be as well ? ?Discussion: We discussed goals of care for Krystal White .  ? ?Code status: Full DNR ? ?Disposition: Home with Hospice ? ?Time spent for the meeting: 15 mins ? ? ? ?Max Sane, MD ? ?07/03/2021, 1:27 PM ?  ?

## 2021-07-03 NOTE — TOC Transition Note (Signed)
Transition of Care (TOC) - CM/SW Discharge Note ? ? ?Patient Details  ?Name: Krystal White ?MRN: [id number] ?Date of Birth: August 29, 1924 ? ?Transition of Care (TOC) CM/SW Contact:  ?Laurena Slimmer, RN ?Phone Number: ?07/03/2021, 2:39 PM ? ? ?Clinical Narrative:    ?9:38am Attempted to contact Milus Glazier at DSS for possible information regarding assigned DSS representative and to advise of patient discharge today. Left a message requesting return call to this CM.  ? ?1:40pm Attending MD request hospice referral. Per Elodia [facility] representative, access to patient chart would require DSS guardian  approval. Attempt made to DSS by [facility]. Attempt unsuccessful.  ? ?1:52 Retrieved call from Earl Lagos from [place] [phone]. Janett Billow informed and agreed to patient discharge back to [facility]. Janett Billow advised of MD referral for hospice. Janett Billow inquired about [facility]. Provided attending and [facility] representative with [facility] contact information.  ? ?2:08pm EMS packet on chart, Nurse notified of room number and where to call report. EMS transport arranged. TOC signing off.  ? ? ?  ?Barriers to Discharge: Continued Medical Work up ? ? ?Patient Goals and CMS Choice ?Patient states their goals for this hospitalization and ongoing recovery are:: DSS legal guardian plan to return to [facility] ?  ?  ? ?Discharge Placement ?  ?           ?  ?  ?  ?  ? ?Discharge Plan and Services ?  ?Discharge Planning Services: CM Consult ?Post Acute Care Choice: Resumption of Svcs/PTA Provider, [facility]          ?DME Arranged: N/A ?DME Agency: NA ?  ?  ?  ?HH Arranged: NA ?[place] Agency: NA ?  ?  ?  ? ?Social Determinants of Health (SDOH) Interventions ?  ? ? ?Readmission Risk Interventions ?   ? View : No data to display.  ?  ?  ?  ? ? ? ? ? ?

## 2022-02-24 DEATH — deceased
